# Patient Record
Sex: Female | Born: 1935 | Race: Black or African American | Hispanic: No | Smoking: Former smoker
Health system: Southern US, Community
[De-identification: ages and names within clinical notes are randomized; demographics above are authoritative.]

## PROBLEM LIST (undated history)

## (undated) DIAGNOSIS — I2510 Atherosclerotic heart disease of native coronary artery without angina pectoris: Secondary | ICD-10-CM

## (undated) DIAGNOSIS — R935 Abnormal findings on diagnostic imaging of other abdominal regions, including retroperitoneum: Secondary | ICD-10-CM

## (undated) DIAGNOSIS — N12 Tubulo-interstitial nephritis, not specified as acute or chronic: Secondary | ICD-10-CM

## (undated) DIAGNOSIS — I639 Cerebral infarction, unspecified: Secondary | ICD-10-CM

## (undated) DIAGNOSIS — S065XAA Traumatic subdural hemorrhage with loss of consciousness status unknown, initial encounter: Secondary | ICD-10-CM

## (undated) DIAGNOSIS — I519 Heart disease, unspecified: Secondary | ICD-10-CM

## (undated) DIAGNOSIS — S065X9A Traumatic subdural hemorrhage with loss of consciousness of unspecified duration, initial encounter: Secondary | ICD-10-CM

## (undated) DIAGNOSIS — K5790 Diverticulosis of intestine, part unspecified, without perforation or abscess without bleeding: Secondary | ICD-10-CM

## (undated) DIAGNOSIS — M1990 Unspecified osteoarthritis, unspecified site: Secondary | ICD-10-CM

## (undated) DIAGNOSIS — I10 Essential (primary) hypertension: Secondary | ICD-10-CM

## (undated) HISTORY — DX: Traumatic subdural hemorrhage with loss of consciousness status unknown, initial encounter: S06.5XAA

## (undated) HISTORY — DX: Traumatic subdural hemorrhage with loss of consciousness of unspecified duration, initial encounter: S06.5X9A

## (undated) HISTORY — DX: Abnormal findings on diagnostic imaging of other abdominal regions, including retroperitoneum: R93.5

## (undated) HISTORY — PX: TONSILLECTOMY AND ADENOIDECTOMY: SUR1326

## (undated) HISTORY — DX: Tubulo-interstitial nephritis, not specified as acute or chronic: N12

---

## 1983-07-12 HISTORY — PX: TOTAL ABDOMINAL HYSTERECTOMY: SHX209

## 2000-02-03 ENCOUNTER — Ambulatory Visit (HOSPITAL_COMMUNITY): Admission: RE | Admit: 2000-02-03 | Discharge: 2000-02-03 | Payer: Self-pay | Admitting: Orthopedic Surgery

## 2000-02-03 ENCOUNTER — Encounter: Payer: Self-pay | Admitting: Orthopedic Surgery

## 2002-07-11 HISTORY — PX: CRANIOTOMY: SHX93

## 2003-02-19 ENCOUNTER — Encounter: Payer: Self-pay | Admitting: Neurosurgery

## 2003-02-19 ENCOUNTER — Inpatient Hospital Stay (HOSPITAL_COMMUNITY): Admission: EM | Admit: 2003-02-19 | Discharge: 2003-02-27 | Payer: Self-pay | Admitting: Emergency Medicine

## 2003-02-19 ENCOUNTER — Encounter (INDEPENDENT_AMBULATORY_CARE_PROVIDER_SITE_OTHER): Payer: Self-pay | Admitting: *Deleted

## 2003-02-21 ENCOUNTER — Encounter: Payer: Self-pay | Admitting: Neurosurgery

## 2003-02-24 ENCOUNTER — Encounter: Payer: Self-pay | Admitting: Neurosurgery

## 2003-02-26 ENCOUNTER — Encounter: Payer: Self-pay | Admitting: Neurosurgery

## 2003-03-21 ENCOUNTER — Encounter: Payer: Self-pay | Admitting: Neurosurgery

## 2003-03-21 ENCOUNTER — Ambulatory Visit (HOSPITAL_COMMUNITY): Admission: RE | Admit: 2003-03-21 | Discharge: 2003-03-21 | Payer: Self-pay | Admitting: Neurosurgery

## 2003-04-14 ENCOUNTER — Ambulatory Visit (HOSPITAL_COMMUNITY): Admission: RE | Admit: 2003-04-14 | Discharge: 2003-04-14 | Payer: Self-pay | Admitting: Neurosurgery

## 2003-04-14 ENCOUNTER — Encounter: Payer: Self-pay | Admitting: Neurosurgery

## 2003-09-13 ENCOUNTER — Ambulatory Visit (HOSPITAL_COMMUNITY): Admission: RE | Admit: 2003-09-13 | Discharge: 2003-09-13 | Payer: Self-pay | Admitting: Emergency Medicine

## 2004-07-11 DIAGNOSIS — N12 Tubulo-interstitial nephritis, not specified as acute or chronic: Secondary | ICD-10-CM

## 2004-07-11 HISTORY — DX: Tubulo-interstitial nephritis, not specified as acute or chronic: N12

## 2004-11-23 ENCOUNTER — Emergency Department (HOSPITAL_COMMUNITY): Admission: EM | Admit: 2004-11-23 | Discharge: 2004-11-24 | Payer: Self-pay | Admitting: Emergency Medicine

## 2004-11-24 ENCOUNTER — Ambulatory Visit: Payer: Self-pay | Admitting: Sports Medicine

## 2004-11-24 ENCOUNTER — Inpatient Hospital Stay (HOSPITAL_COMMUNITY): Admission: AD | Admit: 2004-11-24 | Discharge: 2004-11-30 | Payer: Self-pay | Admitting: Sports Medicine

## 2004-11-24 HISTORY — PX: FLEXIBLE SIGMOIDOSCOPY: SHX1649

## 2004-11-30 ENCOUNTER — Encounter: Admission: RE | Admit: 2004-11-30 | Discharge: 2004-11-30 | Payer: Self-pay | Admitting: Gastroenterology

## 2005-02-22 ENCOUNTER — Ambulatory Visit (HOSPITAL_COMMUNITY): Admission: RE | Admit: 2005-02-22 | Discharge: 2005-02-22 | Payer: Self-pay | Admitting: Emergency Medicine

## 2006-10-13 ENCOUNTER — Encounter: Admission: RE | Admit: 2006-10-13 | Discharge: 2006-10-13 | Payer: Self-pay | Admitting: Emergency Medicine

## 2008-03-27 ENCOUNTER — Encounter: Admission: RE | Admit: 2008-03-27 | Discharge: 2008-03-27 | Payer: Self-pay | Admitting: Gastroenterology

## 2008-10-01 ENCOUNTER — Encounter: Admission: RE | Admit: 2008-10-01 | Discharge: 2008-10-01 | Payer: Self-pay | Admitting: Emergency Medicine

## 2010-04-01 ENCOUNTER — Encounter: Admission: RE | Admit: 2010-04-01 | Discharge: 2010-04-01 | Payer: Self-pay | Admitting: Emergency Medicine

## 2010-08-01 ENCOUNTER — Encounter: Payer: Self-pay | Admitting: Emergency Medicine

## 2010-11-26 NOTE — Discharge Summary (Signed)
NAMESHAKEMIA, [name]              ACCOUNT NO.:  [id number]   MEDICAL RECORD NO.:  [id number]          PATIENT TYPE:  INP   LOCATION:  5507                         FACILITY:  [facility]   PHYSICIAN:  Melina Fiddler, MD DATE OF BIRTH:  [birth year]   DATE OF ADMISSION:  11/24/2004  DATE OF DISCHARGE:                                 DISCHARGE SUMMARY   PROBLEM LIST:  1.  Fever, pyelonephritis. Blood cultures were negative and urine cultures      were negative were unable to be utilized secondary to contaminated urine      sample. The patient did respond to Cipro and remained afebrile and      stable with no symptoms of urinary tract infection throughout the rest      of hospitalization. WBC trended down to within normal limits.   Dictation ended at this point.      FIM/MEDQ  D:  11/29/2004  T:  11/30/2004  Job:  [id number]

## 2010-11-26 NOTE — Op Note (Signed)
NAME:  [name], [name]              ACCOUNT NO.:  [id number]   MEDICAL RECORD NO.:  [id number]          PATIENT TYPE:  INP   LOCATION:  5507                         FACILITY:  [facility]   PHYSICIAN:  Anselmo Rod, M.D.  DATE OF BIRTH:  1934-01-17   DATE OF PROCEDURE:  11/24/2004  DATE OF DISCHARGE:  11/30/2004                                 OPERATIVE REPORT   PROCEDURE PERFORMED:  Colonoscopy changed to a flexible sigmoidoscopy.   ENDOSCOPIST:  Anselmo Rod, M.D.   INSTRUMENT USED:  Olympus video colonoscope.   INDICATIONS FOR PROCEDURE:  A 77 year old, African-American female with a  history of change in bowel habits and abnormal CT with thickening of the  sigmoid colon noted, undergoing a colonoscopy to rule out colitis.   PRE-PROCEDURE PREPARATION:  Informed consent was procured from the patient.  The patient was fasted for eight hours prior to the procedure and prepped  with a bottle of magnesium citrate and a gallon of GoLYTELY the night prior  to the procedure.  Risks and benefits of the procedure, including a 10% miss  rate of cancer and polyps, were discussed with the patient as well.   PRE-PROCEDURE PHYSICAL:  VITAL SIGNS:  The patient had stable vital signs.  NECK:  Supple.  CHEST:  Clear to auscultation.  CARDIOVASCULAR:  S1, S2 regular.  ABDOMEN:  Soft, with normal bowel sounds.   DESCRIPTION OF THE PROCEDURE:  The patient was placed in the left lateral  decubitus position, sedated with 50 mg of Demerol and 6 mg of Versed in slow  incremental doses.  Once the patient was adequately sedated and maintained  on low-flow oxygen and continuous cardiac monitoring, the Olympus video  colonoscope was advanced from the rectum to the midsigmoid colon with  extreme difficulty.  The adult scope could not be advanced beyond the  midsigmoid colon.  Therefore, it was withdrawn.  Pediatric scope introduced  instead.  However, in spite of all efforts and changing the patient's  position from the left lateral to the right lateral position and the supine  position, we were not able to advance the scope beyond the midsigmoid colon.  The procedure was aborted at that point, with plans to do a virtual  colonoscopy tomorrow.   IMPRESSION:  Incomplete colonoscopy.  Very tortuous colon, with probably  extensive diverticular disease.   RECOMMENDATIONS:  A virtual colonoscopy will be done, as discussed with Dr.  Kennith Center in the department of radiology, and further recommendations  will be made as needed.      JNM/MEDQ  D:  11/30/2004  T:  12/01/2004  Job:  [id number]   cc:   Melina Fiddler, MD  [address] [place]  [place] [address]  Fax: [phone]

## 2010-11-26 NOTE — H&P (Signed)
NAME:  [name], [name] NO.:  [id number]   MEDICAL RECORD NO.:  [id number]                   PATIENT TYPE:  INP   LOCATION:  1827                                 FACILITY:  [facility]   PHYSICIAN:  Hewitt Shorts, M.D.            DATE OF BIRTH:  03-02-1934   DATE OF ADMISSION:  02/19/2003  DATE OF DISCHARGE:                                HISTORY & PHYSICAL   HISTORY OF PRESENT ILLNESS:  The patient is a 77 year old right-handed black  female who was transferred from [facility] today by EMS to [facility] Emergency Room for further evaluation and care of an  acute and chronic right frontotemporal subdural hematoma.   The patient has been followed at [facility] for hypertension,  currently treated with hydrochlorothiazide and atenolol.  She presented  today having cut herself superolaterally to her right eye after she had  difficulty controlling her gait.  She explained to Dr. Salvadore Farber at [facility] that once she got started, she had difficulty stopping.   The patient explained to me that she has been stumbling for the last two  days.  When she starts walking, her gait picks up, and she cannot slow it  down.  She feels her equilibrium is off, and she cannot control her walking.   She explains that she fell earlier today but did not hit her head.  Later  today, she ran into a fence that resulted in the cut near her eye.  That cut  was sutured at [facility] by Dr. Salvadore Farber.   Because of these difficulties, she went ahead and obtained a CT scan at  [facility] which shows a moderate size right frontotemporal  acute and chronic subdural hematoma with moderate mass effect and midline  shift.   Dr. Salvadore Farber arranged for transfer to [facility]  Emergency Room by EMS and called for Neurosurgical consultation.   The patient notes that she has been having headaches  since mid July.  She  has no particular precipitating cause for the headaches.  She does feel that  as far as trauma is concerned, she did bump her head on a cabinet  subsequently to the development of her headaches.   PAST MEDICAL HISTORY:  Significant for a history of hypertension, treated  for more than 30 years, currently treated at [facility] with  hydrochlorothiazide and atenolol.  She does not describe any history of  myocardial infarction, cancer, stroke, diabetes, peptic ulcer disease, or  lung disease.   PAST SURGICAL HISTORY:  1. Three cesarean sections.  2. Hysterectomy.  3. Tonsillectomy.   ALLERGIES:  She denies allergies to medications.   MEDICATIONS:  In addition to the hydrochlorothiazide and atenolol, her only  other medication is occasional Mylanta p.r.n. for gas.   FAMILY HISTORY:  Her parents have both passed on.  Her  mother died of a  stroke.  She believes her father had heart disease.   SOCIAL HISTORY:  The patient is separated from her husband, but they do help  to look after each other.  He has had some recent heart disease.  She has  one son who lives with her in [place] and another daughter who lives in  [place].  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 She smokes 1/2 pack a day.  She  has been smoking for nearly 50 years.  She does not drink alcoholic  beverages.   REVIEW OF SYSTEMS:  She does not describe any diplopia, blurred vision,  weakness, language or speech dysfunction, seizures, or nausea or vomiting.  Review of Systems is, otherwise, unremarkable.   PHYSICAL EXAMINATION:  GENERAL:  The patient is a well-developed, well-  nourished black female in no acute distress.  VITAL SIGNS:  Temperature 97.6, pulse 61, blood pressure 171. 102,  respiratory rate 18.  LUNGS:  Clear to auscultation.  She has symmetric respiratory excursion.  HEART:  Regular rate and rhythm;  S1, S2.  There is no murmur.  ABDOMEN:  Soft, nondistended.  Bowel sounds are present.  EXTREMITIES:  No clubbing, cyanosis, or edema.  HEENT:  Examination shows a sutured laceration superolateral to the right  eye.  NEUROLOGICAL:  The patient was awake, alert, fully oriented.  Speech is  fluent, and she has good comprehension.  Cranial nerves show pupils to be  equal, round, and reactive to light and about 2 mm bilaterally.  Extraocular  movements are intact.  Facial movement is symmetrical.  Hearing is present.  Palate movement is symmetrical.  Shoulder shrug is symmetrical, and tongue  is in the midline.  Motor examination shows 5/5 strength in the upper and  lower extremities.  She has no drift to the upper extremities.  Sensation is  intact to pinprick throughout the extremities.  Reflexes are symmetrical.  Toes are downgoing.  Gait and stance were not tested.   IMPRESSION:  Moderate size right frontotemporal acute and chronic subdural  hematoma with mass effect and midline shift, associated with gait  dysfunction and imbalance as well as with headache.  Neurologically, though,  she is intact.   PLAN:  The patient is to be admitted to the Neurosurgical Intensive Care  Unit. I do recommend craniotomy for evacuation of the subdural hematoma, and  we will plan on loading the patient with Dilantin for seizure prophylaxis.  I have had the opportunity to discuss my assessment and recommendations with  the patient.  I have recommended, though, that we try to have her family  come into to the hospital so that we can discuss all together her  difficulties and our recommendations. She is going to contact her husband  (from whom she is separated), her son, and her daughter.                                                Hewitt Shorts, M.D.   RWN/MEDQ  D:  02/19/2003  T:  02/19/2003  Job:  [id number]   cc:   Delano Metz, M.D. [address]  [place]  [place] [address]  Fax:  [phone]

## 2010-11-26 NOTE — H&P (Signed)
NAME:  [name], [name]              ACCOUNT NO.:  [id number]   MEDICAL RECORD NO.:  [id number]          PATIENT TYPE:  INP   LOCATION:  5501                         FACILITY:  [facility]   PHYSICIAN:  Melina Fiddler, MD DATE OF BIRTH:  1934-04-08   DATE OF ADMISSION:  11/24/2004  DATE OF DISCHARGE:                                HISTORY & PHYSICAL   CHIEF COMPLAINT:  Fever.   HISTORY OF PRESENT ILLNESS:  The patient is a 77 year old African American  female.  She was at work on Nov 23, 2004 and her co-workers noticed her to  have bad color.  The patient said that she didn't feel quite well.  While at work, her legs gave way.  She had no symptoms of  presyncope/syncope/dizziness.  She went home after work and was noticed by  her daughter to have an increased temperature of greater than 100.4 degrees  and continued weakness along with decreased level of activity.  She was  taken to the ER and a CT was negative of her head.  She did have a UA that  was positive for pyuria.  She was diagnosed with UTI and prescribed  Macrobid, and discharged home.  At home, on Nov 24, 2004, her fever  continued and she went to urgent care, and was found to have a temperature  of 102 with continued weakness, so she was sent to [facility] for treatment.   PAST MEDICAL HISTORY:  Past medical history is notable for:  1.  Hypertension.  2.  History of subdural hematoma with craniotomy.  The patient is on      Dilantin for seizure prophylaxis; she has never had a seizure.  3.  History of UTI.  4.  History of hypokalemia for which she takes K-Dur.   SOCIAL HISTORY:  She lives with a son.  She is married.  She works at  __________.  No alcohol, no tobacco, no drugs.  She is independent with  ADLs.   FAMILY HISTORY:  Family history positive for hypertension and diabetes  mellitus.   MEDICATIONS:  1.  Dilantin 100 mg p.o. daily.  2.  Hydrochlorothiazide 12.5 mg p.o. daily.  3.  Atenolol  12.5 mg p.o. daily.  4.  K-Dur.  5.  Macrobid.   ALLERGIES:  FLAGYL causing a rash.   CODE STATUS:  Full code.   REVIEW OF SYSTEMS:  Review of systems positive for fevers, negative for  rash.  No nausea and vomiting.  The patient is taking good p.o.  No  diarrhea.  No back pain.  Positive right abdominal pain.  No dysuria,  although possible history of incontinence and urgency/frequency.  No chest  pain, no shortness of breath.   PHYSICAL EXAM:  VITAL SIGNS:  Temperature 101.8, pulse 85, respiratory rate  18, blood pressure 105/70.  GENERAL:  The patient is in no apparent distress.  Per the daughter, the  speech has slowed somewhat from normal, but she is alert and oriented and  responsive.  HEENT:  Normocephalic, atraumatic.  Pupils are equal, round and reactive to  light.  Mucous membranes are moist.  No oropharyngeal erythema.  The lips  appear slightly dry.  NECK:  No lymphadenopathy, no thyromegaly.  Supple.  CARDIOVASCULAR:  Regular rate and rhythm with no murmurs, rubs, or gallops  appreciated.  RESPIRATORY:  Clear to auscultation bilaterally with fair respiratory  effort.  ABDOMEN:  Abdomen soft, slightly tender at the right upper and lower  quadrants, diffuse, but there is no rebound, no peritoneal signs, positive  bowel sounds.  BACK:  No CVA pain.  EXTREMITIES:  No edema.  Pulses 2+.  SKIN:  No rash.   LABORATORY VALUES:  Laboratory values, all from Nov 22, 2004:  Urine  cultures and blood cultures are pending.  UA at presentation last p.m. into  the emergency room, cloudy with 15 ketones, positive for blood and protein,  positive leukocyte esterase; microscopic examination -- 7-10 white cells, 3-  6 red blood cells, a few bacteria.  Dilantin level 17.7.  CBC:  White count  14.9, hemoglobin 13.3, hematocrit 39.2, platelets 171,000.  CMP:  Sodium  134, potassium 2.9, chloride 107, bicarb 28, BUN 11, creatinine 1, calcium  8.6, glucose 125, total bilirubin 0.9,  alkaline phosphatase 110, AST 23, ALT  18, total protein 6.6, albumin 3.3.   Head CT noted with no acute disease.   Chest x-ray:  No acute disease.   ASSESSMENT AND PLAN:  The patient is a 77 year old female with:  1.  Fever/presume pyelonephritis.  Fever continues on Macrobid.  I will      admit her for intravenous fluids and observation, and begin intravenous      Cipro.  Cultures are pending from Nov 23, 2004 and will not re-culture      because she has been on antibiotics in the meantime and we are awaiting      sensitivities.  There is no other source of the infection identified at      this time.  The abdominal pain is diffuse, but LFTs are okay and there      is no rebound tenderness with positive bowel sounds and nausea or      vomiting.  I did not have any concerns on physical exam for appendicitis      or other causes of peritoneal inflammation.  She has no respiratory      symptoms.  Given the pyuria, this seems to be the most likely source,      though certainly an atypical presentation.  I will give Tylenol as      needed for fever.  2.  Fluids, electrolytes and nutrition:  Regular diet and replete her      potassium.  She will get a BMET in the morning.  We will bolus 500 mL of      normal saline now and then give her 150 an hour of half normal saline      with 20 mEq of potassium.  3.  Hypertension:  Continue home medications.  4.  Seizure prophylaxis:  Continue Dilantin and Dilantin was within normal      limits.  5.  Deep venous thrombosis prophylaxis:  Given the reasons for #4, we will      hold the Lovenox and use sequential compression devices.  6.  Disposition pending #1.      GSD/MEDQ  D:  11/25/2004  T:  11/25/2004  Job:  [id number]   cc:   Brett Canales A. Cleta Alberts, M.D.  [address]  [place]  [place] [address]  Fax:  [phone] 

## 2010-11-26 NOTE — Consult Note (Signed)
NAME:  SHINA, WASS              ACCOUNT NO.:  [id number]   MEDICAL RECORD NO.:  [id number]          PATIENT TYPE:  INP   LOCATION:  5507                         FACILITY:  [facility]   PHYSICIAN:  Anselmo Rod, M.D.  DATE OF BIRTH:  05/24/1934   DATE OF CONSULTATION:  11/26/2004  DATE OF DISCHARGE:                                   CONSULTATION   GI CONSULTATION:   REASON FOR CONSULTATION:  Abnormal CT scan, ? colitis.   PROBLEM LIST:  1. Thickened sigmoid colon, CT done during this hospitalization,      ?diverticulitis versus infectious colitis versus neoplasm.  2. Pyelonephritis, on Cipro.  3. Hypertension, on Atenolol and hydrochlorothiazide.  4. History of an craniotomy after subdural hematoma.  5. History of seizure disorder, on Dilantin.  6. History of hypokalemia, on K-Dur     RECOMMENDATIONS:  1. Agree with stool studies.  2. Will prep the patient for a colonoscopy on Monday and keep her on      clears all day Sunday.  Further recommendations after.  3. Continue Cipro for now.     DISCUSSION:  [name] is a 77 year old African-American female who  came to [facility] on Nov 24, 2004 when she presented with weakness  and prior to admission she had fevers of 101 and 102 and in the process of  her workup had a CT scan of the abdomen that showed thickening of the  sigmoid colon with an enlarged lymph node in the gastrohepatic ligament and  possibly one in the neck of gallbladder with some stone in the gallbladder.  The patient denies any problems with abnormal weight loss, nausea and  vomiting, hematochezia, melena.  She has had some left lower quadrant pain  but this has been mild in nature with no specific radiation.  Her appetite  is good.  Her weight has been stable.  She has had some loose stools  when  she has been in the hospital but the symptoms started after the CT,  ?contrast induced diarrhea.  She denies problems with anemia.  There is no  previous history of ulcers, thrombus or colitis.   ALLERGIES:  FLAGYL.   PAST MEDICAL HISTORY:  See list above.   SOCIAL HISTORY:  She lives with her son.  She is married.  She works at  [employer].  She denies use of alcohol, tobacco or drugs.  She is independent.   FAMILY HISTORY:  There is no family history of breast, uterine or colon  cancer.  There are several family members with hypertension and diabetes.   PHYSICAL EXAMINATION:  General physical examination reveals an elderly  African-American female in no acute distress, lying comfortably in bed.  HEENT:  Reveals signs of her previous craniotomy.  NECK:  Supple.  CHEST:  Clear to auscultation.  HEART:  S1, S2 regular.  ABDOMEN:  Soft, with normal bowel sounds.   LABORATORY DATA:  White count of 8.2 yesterday, with group home of 12.5,  hematocrit 37.3, MCV 99.1, platelets of 168,000.  Sodium yesterday was 130  with potassium 4.7, chloride 103, CO2  20, glucose 120, BUN 5, creatinine  0.9, total bilirubin 1.3, alkaline phosphatase 100.  AST 20, ALT 15, total  protein 6.2, albumin 2.6, calcium 8.8.  phenytoin level was 14.4.  Urinalysis showed 7-10 white blood cells per high-powered field, with 3-6  red blood cells per high-powered field.  Stool was negative for occult  blood.   CT scan of the abdomen and pelvis with contrast showed hyper density in the  durum of the liver, abnormally enlarged lymph nodes in the gastrohepatic  ligament, and strain pattern on the right kidney with multiple areas of  hypodensity but no contour abnormalities suggesting pyelonephritis.  There  is a question of stone in the neck of the gallbladder versus calcified lymph  node.  CT of the pelvis with contrast showed right inguinal hernia  containing  small bowel without obstruction.  There was thickening of the sigmoid colon  with possibility including diverticulitis versus neoplasm.   PLAN:  Further recommendations will be made as we continue to  follow the  patient.      JNM/MEDQ  D:  11/26/2004  T:  11/27/2004  Job:  [id number]

## 2010-11-26 NOTE — Discharge Summary (Signed)
NAMEDENELL, COTHERN              ACCOUNT NO.:  [id number]   MEDICAL RECORD NO.:  [id number]          PATIENT TYPE:  INP   LOCATION:  5507                         FACILITY:  [facility]   PHYSICIAN:  Melina Fiddler, MD DATE OF BIRTH:  [birth year]   DATE OF ADMISSION:  11/24/2004  DATE OF DISCHARGE:  11/30/2004                                 DISCHARGE SUMMARY   DIAGNOSES:  1.  Thirst, fever, pyelonephritis.  2.  Abdominal pain.  3.  Hypertension.  4.  Fissure.  5.  Hypokalemia.   DISCHARGE MEDICATIONS:  1.  Dilantin 300 mg p.o. daily.  2.  HCTZ 12.5 mg 1 tablet p.o. daily.  3.  K-Dur 10 mEq 1 tablet p.o. daily.  4.  Cipro 500 mg 1 tablet p.o. daily for eight days.   BRIEF HISTORY:  A 77 year old African-American female who was at work on Nov 23, 2004 as a coworker noticed that she has a bad color.  The patient stated  that she did not feel quite well. While at work, her legs gave away. She had  no symptoms of presyncope/dizziness. The patient went home from work and was  noticing by her brother to have an increased temperature rated at 100.4 and  continued weakness along with decrease level of activities.   The patient was taken to the ER and a CT of head was negative. A urinalysis  was positive for pyuria. She was diagnosed with UTI and was prescribed  Macrobid IV and discharged home. At home on Nov 24, 2004, fever continued  and he went to urgent care and was found to have temperature of 102.0 with  continued weakness. She was sent to [facility] for  treatment.   PHYSICAL EXAMINATION:  VITAL SIGNS:  Temperature was 101.8, pulse 85,  respiratory rate 18, blood pressure 105/70.  GENERAL:  The patient was in no acute distress. Per daughter, the speech was  not normal but she is alert and oriented x 3 and __________.  ABDOMEN:  Physical examination was unremarkable except for on an abdominal  examination that showed some ascites tenderness at the right upper  and lower  quadrant, diffuse. There were no rebound, guarding. Bowel sounds were  positive. No CVA pain.   LABORATORY DATA:  On admission, urine and blood culture were pending from  Nov 22, 2004. As UA at presentation in to the emergency room showed colonies  with 15 ketones, positive for blood and protein, positive for LE.  Microscopic examination showed 7 to 10 white blood cells, 3 to 6 red blood  cells, few bacteria. Dilantin level was 17.7. White blood cell count was  10.9, hemoglobin 17.3, hematocrit 39.2, platelet count 171,000. Sodium 134,  potassium 2.9, chloride 107, bicarbonate 28, BUN 11, creatinine 1, calcium  8.6, glucose 125, total bilirubin 0.9, alkaline phosphate 110, AST 23, ALT  18, protein 6.6, albumin 3.3.   Chest CT showed no acute disease. Chest x-ray showed no acute disease.   HOSPITAL COURSE:  Problem 1:  FEVER, PYELONEPHRITIS:  As CT scan showed a  strain pattern on the  right kidney with multiple areas of hypodensity but no  contour abnormalities though she has pyelonephritis. The patient was placed  on Cipro 400 mg initially IV p.o. daily.   By the day of discharge, the patient has been afebrile for four days. White  blood cell count trended down to normal limits. The patient's benign CVA,  dysuria, frequency. The patient was discharged on Cipro 500 mg 1 tablet p.o.  daily for eight days to complete a 14-day course for treatment of  pyelonephritis. This issue to be followed by primary care physician, Dr. Delano Metz.   Problem 2:  ABDOMINAL PAIN:  The patient initially complained of right upper  and right lower quadrant pain. This pain resolved by itself. By the second  day of admission, the patient complained of left lower quadrant pain. The  patient described the pain as a compression, dull pain. Intensity was 2 to 4  out of 10. The pain was on and off. Palpation on the left lower quadrant has  to be done for the patient to be able to feel this pain.  The abdominal and  pelvic CT scan with contrast showed hypodensity in the durum of the liver,  abnormally enlarged lymph nodes at the hepatic gastrohepatic ligament and  right inguinal hernia containing a small bowel without obstruction. There  was also a thickening of the sigmoid colon. GI was consulted. Per Dr. Anselmo Rod, the thickened of the sigmoid could possibly diverticulitis versus  neoplasm. A colonoscopy was performed on Nov 29, 2004. The procedure was  unable to be rescheduled secondary to __________.  I spoke with Dr. Anselmo Rod, GI, who explained to me that diverticulosis was found on sigmoid  colon of this patient. As colonoscopy was unable to be completed, a virtual  colonoscopy was scheduled on Nov 30, 2004. This virtual colonoscopy was  performed at [facility] by Dr. Lorenda Ishihara. Mansell. He is to be followed  by Dr. Anselmo Rod as an outpatient.   Problem 3:  HYPERTENSION:  The patient's blood pressure was stable  throughout the hospitalization. The patient was discharged on HCTZ 12.5 mg 1  tablet p.o. daily. Followed by primary care physician, Dr. Delano Metz.   Problem 4:  SEIZURE PROPHYLAXIS:  The patient was placed on home regimen,  Dilantin 300 mg p.o. daily. The patient was free of any seizure episodes  while hospitalized.   Problem 5:  HYPOKALEMIA:  Repleted. To be followed primary care physician.   CONDITION ON DISCHARGE:  Stable.   PROCEDURE:  CT of the abdomen, CT of the pelvis, colonoscopy (complete).   FOLLOW UP:  1.  Eric A. Molli Posey, M.D. at [facility] at 3:15 with Ma Hillock on Nov 09, 2004 for a cysto and colonoscopy.  2.  The patient will follow up with Dr. Anselmo Rod, GI, to arrange an      appointment for follow-up of diverticulosis, [phone].  3.  Dr. Delano Metz, PMD, __________.      FIM/MEDQ  D:  11/29/2004  T:  11/29/2004  Job:  [id number]   cc:   Minerva Areola A. Molli Posey, M.D. [phone] [address]   [place]  [place] [address]  Fax: [phone]   Anselmo Rod, M.D.  [address]  [address]  [place]  [place] [address]  Fax: [phone]   Delano Metz, M.D.  [address]  [place]  [place] [address]  Fax:  [phone] 

## 2010-11-26 NOTE — Op Note (Signed)
NAME:  [name], [name]                          ACCOUNT NO.:  [id number]   MEDICAL RECORD NO.:  [id number]                   PATIENT TYPE:  INP   LOCATION:  3111                                 FACILITY:  [facility]   PHYSICIAN:  Hewitt Shorts, M.D.            DATE OF BIRTH:  [birth year]   DATE OF PROCEDURE:  02/19/2003  DATE OF DISCHARGE:                                 OPERATIVE REPORT   PREOPERATIVE DIAGNOSIS:  Right frontotemporal acute and chronic subdural  hematoma.   POSTOPERATIVE DIAGNOSIS:  Right frontotemporal chronic subdural hematoma.   PROCEDURE:  Right frontotemporal craniotomy and evacuation of subdural  hematoma.   SURGEON:  Hewitt Shorts, M.D.   ANESTHESIA:  General endotracheal.   INDICATIONS:  The patient is a 77 year old woman who presented with  headaches, unsteadiness, and gait difficulties, who was found to have a  moderately large right frontotemporal subdural hematoma.  A decision was  made to proceed with craniotomy and evacuation of such.   DESCRIPTION OF PROCEDURE:  The patient was brought to the operating room,  placed under general endotracheal anesthesia.  The patient's scalp was  shaved.  The head was gently turned to the left and rested in a soft  doughnut head rest and was prepped with Betadine soap and solution and  draped in a sterile fashion.  A right frontotemporal curvilinear incision  was made.  The line of the incision was infiltrated with local anesthetic  with epinephrine.  Rainey clips were applied to the scalp edges to maintain  hemostasis.  The temporalis fascia and muscle were divided and the scalp  flap reflected and the right frontotemporal region exposed.  Three bur holes  were made using the [facility] Max drill.  The dura was dissected from the  overlying skull and using the craniotome, we were able to elevate a bone  flap.  The dura was full and had a bluish discoloration.  We opened the dura  and exposed chronic subdural  membrane.  The membrane was, in fact, opened,  and one found several layers of membranes with chronic subdural hematoma of  various ages, the great majority of which was quite liquid, other portions  of it were somewhat thicker; however, no acute subdural hematoma was found,  rather just chronic subdural hematoma of varying ages.  The  membranes were  tacked up to the dura and the subdural space irrigated extensively with  saline solution and the subdural hematoma evacuated.  The brain was  pulsating well.  The dura was tacked up around the margins of the craniotomy  and then a flat Jackson-Pratt drain was brought out through a separate stab  incision.  The drain itself was laid in the subdural space.  The dura was  approximated with interrupted 4-0 Nurolon sutures and then the bone flap was  secured with Rapid Flap.  The temporalis fascia and muscle were approximated  with interrupted  2-0 undyed Vicryl suture and the subcutaneous and  subcuticular layer were closed with interrupted, inverted 2-0 undyed Vicryl  sutures, and the skin was reapproximated with surgical staples.  The wound  was dressed with Adaptic and sterile gauze and wrapped with two Kerlix and a  Kling.  The patient tolerated the procedure well.  The estimated blood loss  was 100 mL.  Sponge and needle count were correct.  Following surgery the  patient was to be reversed from the anesthetic, extubated if feasible, and  transferred to the neurosurgical intensive care unit for further care.                                               Hewitt Shorts, M.D.   RWN/MEDQ  D:  02/19/2003  T:  02/20/2003  Job:  [id number]

## 2010-11-26 NOTE — Op Note (Signed)
[facility]  Patient:    [name], [name]                       MRN: [id number] Proc. Date: 02/03/00 Adm. Date:  [id number] Disc. Date: [id number] Attending:  Dominica Severin                           Operative Report  DATE OF BIRTH:  Mar 16, 1934  PREOPERATIVE DIAGNOSIS:  Left ring finger proximal interphalangeal joint interarticular fracture with subluxation and gross displacement.  POSTOPERATIVE DIAGNOSIS:  Left ring finger proximal interphalangeal interarticular fracture with subluxation and gross displacement.  OPERATION PERFORMED:  Open reduction internal fixation left ring finger interarticular PIP joint fracture with displacement.  SURGEON:  Onalee Hua, M.D.  ASSISTANT:  Maud Deed, P.A.  COMPLICATIONS:  None.  ANESTHESIA:  General.  ESTIMATED BLOOD LOSS:  Minimal.  DRAINS:  None.  TOURNIQUET TIME:  Less than an hour.  INDICATIONS FOR PROCEDURE:  This patient is a 77 year old white female with the above mentioned diagnosis. I have counseled her in regards to the risks and benefits of the surgery including risks of infection, bleeding, anesthesia, damage to normal structure and failure of the surgery to accomplish its intended goals of relieving symptoms and restoring function. With this in mind, she desires to proceed. All indications, pre and postoperative measures as well as risk of infection, stiffness, need for hardware removal, tenolysis, etc. have been discussed with the patient at length.  INTRAOPERATIVE FINDINGS:  The patient had a displaced subluxed interarticular fracture about the PIP joint. She was effectively reduced and stabilized with open reduction internal fixation and a smooth arc of motion at the conclusion of the case.  DESCRIPTION OF PROCEDURE:  The patient was seen by myself and anesthesia preoperatively. She was then taken to the operative suite and underwent general anesthesia smoothly. She was  appropriately padded, prepped and draped in the usual sterile fashion with a 10 minute surgical Betadine scrub followed by sterile Betadine painting and draping about the left upper extremity. Once this was done and preoperative antibiotics were in, the patient had manipulation of anesthesia of the fracture fragments. Traction was placed. The patient had a volar radial fragment which was somewhat displaced maneuvered into correct position and pinned to the shaft with 0.35 and 0.28 K wires. Following this, the dorsal fragment was identified and it was unable to be manipulated closed into perfect alignment thus a tourniquet was inflated and a dorsal incision was made. The dorsal incision was made, a skin flap elevated and the patient then underwent exposure of the joint. The joint showed a rather displaced dorsal lip fracture which caused volar subluxation of the joint. A large amount of hematoma and debris was clear from the fracture site. Irrigation was applied meticulous and tedious dissection was carried out under 4.0 loop magnification and the patients dorsal lip fracture was then placed into the interarticular middle phalanx region at the PIP joint and effectively reduced and held manually with K wire. Following this, the patient had Leibinger titanium screws x 2 placed from dorsal to volar with standard AO technique of drilling followed by depth gauge measurement and screw placement. This achieved excellent purchase and effectively stabilized the joint. The patient was stable through a smooth arc or motion to 85 degrees without tendency towards dislocation of subluxation or fracture displacement. With this done, the patient then had percutaneous placement of titanium screws  from ulnar to radial to engage the shaft and volar radial piece. This effectively stabilized the middle phalanx. Additional K wires were then removed and the patient placed in an arc of motion under fluoroscopy which  noted excellent stability and smooth arc of motion. Permanent copies were made for documentation purposes. Following this, the tourniquet was deflated, irrigation was applied to the wound and hemostasis was obtained with bipolar electrocautery. The patient then underwent closure of the wound with interrupted Prolene suture. I was satisfied with the arc of motion, rotator alignment and other intraoperative perimeters. The patient tolerated the procedure without difficulty. Marcaine approximately 9 cc was placed for postop comfort, 0.25% without epinephrine. The patient had Adaptic gauze and a splint placed with volar and dorsal slabs and finger splint. The patient had excellent capillary refill. She was taken to the recovery room in stable condition following extubation in the operative suite. All sponge, needle and instrument counts were reported as correct. There were no immediate operative complications.  She was given Keflex, Phenergan, Robaxin and Percocet to be taken as directed. The patient will notify us should any problems occur otherwise. I will see her in a week in the office.  It has been a pleasure to participate in her care and I look forward to participating in postoperative recovery. DD:  02/03/00 TD:  02/07/00 Job: [id number]

## 2010-11-26 NOTE — Discharge Summary (Signed)
NAME:  [name], [name] NO.:  [id number]   MEDICAL RECORD NO.:  [id number]                   PATIENT TYPE:  INP   LOCATION:  3016                                 FACILITY:  [facility]   PHYSICIAN:  Hewitt Shorts, M.D.            DATE OF BIRTH:  06/15/1934   DATE OF ADMISSION:  02/19/2003  DATE OF DISCHARGE:  02/27/2003                                 DISCHARGE SUMMARY   ADMISSION HISTORY AND PHYSICAL EXAMINATION:  The patient is a 77 year old  woman who was transferred from [facility] by EMS to [facility] after having been found to have a right frontotemporal  subdural hematoma.  Details of the admission history are included in her  admission history and physical examination.   PHYSICAL EXAMINATION:  VITAL SIGNS:  She had normal vital signs.  GENERAL:  Unremarkable.  NEUROLOGIC:  The patient was awake, alert, and oriented.  She had good  strength with no drift.   HOSPITAL COURSE:  The patient was evaluated in the emergency room where she  was found to have a moderate-sized right frontotemporal subdural hematoma  and the decision was made to admit the patient and proceed with surgical  evacuation.  The patient underwent a right frontotemporal craniotomy and  evacuation of subdural hematoma.  A flat Jackson-Pratt was left in place and  there was moderate watery drainage, pink-tinged.  Followup CT scans were  done.  It was felt that there was significant decreased mass effect and an  overall improved appearance.  We were eventually able to remove the Al Pimple drain and place a stitch in the exit site.  The wound, itself, has  healed well, and we have gone ahead and removed the staples and suture.  Followup CT scan shows minimal recurrent/residual subdural hematoma with  overall good evacuation of the subdural hematoma.  She is up and ambulating  actively in the halls, and she is being discharged to home.  Her daughter  is  going to take her home with her, and she, her fiancee, and her son are going  to help look after her.  We have recommended she be walking everyday,  increasing distances.  We do want her to avoid excessive straining and  lifting.  She has given prescriptions for Dilantin 100 mg capsules, brand  name only, to take three capsules q.a.m.  She is to resume her usual blood  pressure medications.  She is to follow up with Dr. Salvadore Farber, at [facility], regarding her blood pressure, and she is to follow up in my  office in three weeks with a CT scan without contrast from [facility] earlier that day.    DISCHARGE DIAGNOSES:  1. Chronic subdural hematoma.  2. Hypokalemia (treated).  3. Hypertension (treated).  Hewitt Shorts, M.D.    RWN/MEDQ  D:  02/27/2003  T:  02/28/2003  Job:  [id number]   cc:   Delano Metz, M.D.  [address]  [place]  [place] [address]  Fax: [phone]

## 2010-11-29 ENCOUNTER — Other Ambulatory Visit: Payer: Self-pay | Admitting: Emergency Medicine

## 2010-11-29 DIAGNOSIS — R51 Headache: Secondary | ICD-10-CM

## 2010-11-30 ENCOUNTER — Ambulatory Visit
Admission: RE | Admit: 2010-11-30 | Discharge: 2010-11-30 | Disposition: A | Discharge status: Home / Self Care | Payer: PRIVATE HEALTH INSURANCE | Source: Ambulatory Visit | Attending: Emergency Medicine | Admitting: Emergency Medicine

## 2010-11-30 DIAGNOSIS — R51 Headache: Secondary | ICD-10-CM

## 2011-04-12 ENCOUNTER — Encounter (HOSPITAL_COMMUNITY): Payer: Self-pay | Admitting: Radiology

## 2011-04-12 ENCOUNTER — Emergency Department (HOSPITAL_COMMUNITY): Payer: Medicare Other

## 2011-04-12 ENCOUNTER — Inpatient Hospital Stay (HOSPITAL_COMMUNITY)
Admission: EM | Admit: 2011-04-12 | Discharge: 2011-04-14 | DRG: 066 | Disposition: A | Discharge status: Home / Self Care | Payer: Medicare Other | Attending: Family Medicine | Admitting: Family Medicine

## 2011-04-12 DIAGNOSIS — Z7902 Long term (current) use of antithrombotics/antiplatelets: Secondary | ICD-10-CM

## 2011-04-12 DIAGNOSIS — I10 Essential (primary) hypertension: Secondary | ICD-10-CM | POA: Diagnosis present

## 2011-04-12 DIAGNOSIS — E785 Hyperlipidemia, unspecified: Secondary | ICD-10-CM | POA: Diagnosis present

## 2011-04-12 DIAGNOSIS — R29810 Facial weakness: Secondary | ICD-10-CM | POA: Diagnosis present

## 2011-04-12 DIAGNOSIS — E876 Hypokalemia: Secondary | ICD-10-CM | POA: Diagnosis present

## 2011-04-12 DIAGNOSIS — R279 Unspecified lack of coordination: Secondary | ICD-10-CM | POA: Diagnosis present

## 2011-04-12 DIAGNOSIS — Z79899 Other long term (current) drug therapy: Secondary | ICD-10-CM

## 2011-04-12 DIAGNOSIS — I6350 Cerebral infarction due to unspecified occlusion or stenosis of unspecified cerebral artery: Principal | ICD-10-CM | POA: Diagnosis present

## 2011-04-12 DIAGNOSIS — R471 Dysarthria and anarthria: Secondary | ICD-10-CM | POA: Diagnosis present

## 2011-04-12 DIAGNOSIS — H9319 Tinnitus, unspecified ear: Secondary | ICD-10-CM | POA: Diagnosis present

## 2011-04-12 DIAGNOSIS — M1990 Unspecified osteoarthritis, unspecified site: Secondary | ICD-10-CM | POA: Diagnosis present

## 2011-04-12 DIAGNOSIS — I359 Nonrheumatic aortic valve disorder, unspecified: Secondary | ICD-10-CM | POA: Diagnosis present

## 2011-04-12 DIAGNOSIS — K5730 Diverticulosis of large intestine without perforation or abscess without bleeding: Secondary | ICD-10-CM | POA: Diagnosis present

## 2011-04-12 HISTORY — DX: Cerebral infarction, unspecified: I63.9

## 2011-04-12 HISTORY — DX: Diverticulosis of intestine, part unspecified, without perforation or abscess without bleeding: K57.90

## 2011-04-12 HISTORY — DX: Essential (primary) hypertension: I10

## 2011-04-12 LAB — DIFFERENTIAL
Monocytes Absolute: 0.8 10*3/uL (ref 0.1–1.0)
Neutro Abs: 4.2 10*3/uL (ref 1.7–7.7)

## 2011-04-12 LAB — URINALYSIS, ROUTINE W REFLEX MICROSCOPIC
Bilirubin Urine: NEGATIVE
Hgb urine dipstick: NEGATIVE
Leukocytes, UA: NEGATIVE
Protein, ur: NEGATIVE mg/dL

## 2011-04-12 LAB — COMPREHENSIVE METABOLIC PANEL
ALT: 11 U/L (ref 0–35)
AST: 15 U/L (ref 0–37)
Albumin: 3.7 g/dL (ref 3.5–5.2)
Calcium: 10.2 mg/dL (ref 8.4–10.5)
Chloride: 103 mEq/L (ref 96–112)
Creatinine, Ser: 0.73 mg/dL (ref 0.50–1.10)
GFR calc non Af Amer: 82 mL/min — ABNORMAL LOW (ref >90)
Glucose, Bld: 82 mg/dL (ref 70–99)
Sodium: 140 mEq/L (ref 135–145)

## 2011-04-12 LAB — RAPID URINE DRUG SCREEN, HOSP PERFORMED
Barbiturates: NOT DETECTED
Benzodiazepines: NOT DETECTED
Cocaine: NOT DETECTED
Opiates: NOT DETECTED

## 2011-04-12 LAB — PROTIME-INR
INR: 1.05 (ref 0.00–1.49)
Prothrombin Time: 13.9 seconds (ref 11.6–15.2)

## 2011-04-12 LAB — POCT I-STAT TROPONIN I: Troponin i, poc: 0.01 ng/mL (ref 0.00–0.08)

## 2011-04-12 LAB — CBC
MCHC: 34.6 g/dL (ref 30.0–36.0)
RBC: 4.54 MIL/uL (ref 3.87–5.11)

## 2011-04-13 ENCOUNTER — Inpatient Hospital Stay (HOSPITAL_COMMUNITY): Payer: Medicare Other

## 2011-04-13 DIAGNOSIS — I10 Essential (primary) hypertension: Secondary | ICD-10-CM

## 2011-04-13 DIAGNOSIS — G459 Transient cerebral ischemic attack, unspecified: Secondary | ICD-10-CM

## 2011-04-13 DIAGNOSIS — I6350 Cerebral infarction due to unspecified occlusion or stenosis of unspecified cerebral artery: Secondary | ICD-10-CM

## 2011-04-13 LAB — LIPID PANEL
Cholesterol: 218 mg/dL — ABNORMAL HIGH (ref 0–200)
Triglycerides: 67 mg/dL (ref <150)

## 2011-04-13 LAB — CARDIAC PANEL(CRET KIN+CKTOT+MB+TROPI)
CK, MB: 2.1 ng/mL (ref 0.3–4.0)
Relative Index: 1.8 (ref 0.0–2.5)
Relative Index: 1.8 (ref 0.0–2.5)
Total CK: 131 U/L (ref 7–177)
Total CK: 117 U/L (ref 7–177)
Troponin I: <0.3 ng/mL (ref <0.30)
Troponin I: <0.3 ng/mL (ref <0.30)

## 2011-04-13 LAB — GLUCOSE, CAPILLARY
Glucose-Capillary: 117 mg/dL — ABNORMAL HIGH (ref 70–99)
Glucose-Capillary: 147 mg/dL — ABNORMAL HIGH (ref 70–99)

## 2011-04-13 LAB — BASIC METABOLIC PANEL
Calcium: 9.7 mg/dL (ref 8.4–10.5)
Creatinine, Ser: 0.68 mg/dL (ref 0.50–1.10)
GFR calc Af Amer: >90 mL/min (ref >90)
Sodium: 142 mEq/L (ref 135–145)

## 2011-04-13 LAB — HEMOGLOBIN A1C: Mean Plasma Glucose: 120 mg/dL — ABNORMAL HIGH (ref <117)

## 2011-04-13 NOTE — H&P (Signed)
Please see handwritten addendum. I have seen and examined this pt with R1 and I agree with the above assessment and plan.  

## 2011-04-13 NOTE — H&P (Signed)
Family Medicine Teaching [facility] Admission History and Physical  Patient name: [name] Medical record number: [id number] Date of birth: 1934-03-23 Age: 77 y.o. Gender: female  Primary Care Provider: No primary provider on file.  Chief Complaint: Left Sided Weakness  History of Present Illness:  [name] is a 77 y.o. year old female presenting with left sided weakness and facial droop for the past 24 hours. She first noticed some arm ataxia and weakness that seemed to improve slightly on its own before going to bed; she also reported some dysarthria that also seemed to improve prior to going to bed.  When she awoke this morning she noticed that her face was drooping on the left while she was putting on makeup.  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  Dr. Cleta Alberts was concerned for a stroke and sent her to [facility] ED for further evaluation.  Of note she has recently had dilantin stopped as a seizure prophylaxis following a subdural hematoma evacuation in 2004.  She has never had a seizure and had been found to be sub therapeutic on her dilantin.    ROS:    Denies chest pain, dyspnea, cough.  No nausea/vomiting.  Some constipation; no melana or hematachezia.  No dysuria, no hematuria.  No other weakness except as above.  No changes in vision, hearing, smell or taste but does have chronic tinnitus.  Some swelling in LE especially Left ankle 2o/2 osteoarthritis.  Some congestion and mucous on chronic basis.  Otherwise 12 point ROS negative   PMHx:  Past Medical History  Diagnosis Date  . Hypertension   . Subdural Hematoma   . Osteoarthritis   . 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  Alert and appropriately interactive.In no distress. HNEENT:  H&N: supple, no adenopathy, trachea midline and no carotid bruits OROPHARYNX: lips, mucosa, and tongue normal; teeth and gums normal EYES: EOMI, PERRLA, no scleral icterus NOSE: normal THORAX: HEART: S1, S2 normal, no murmur, rub or gallop, regular rate and rhythm LUNGS: clear to auscultation, no wheezes or rales and unlabored breathing EXTREMITIES: edema 1+/4 B and Homans sign is negative, no sign of DVT  >NEURO mental status, speech normal, alert and oriented x3, PERLA, reflexes normal and symmetric, gait and station normal; L facial droop; strength 5/5 B in UE and LE however some lateralization of weakness to the left side diffusely;   Labs CBC    Component Value Date/Time   WBC 7.2 04/12/2011 2146   HGB  14.2 04/12/2011 2146   HCT 41.0 04/12/2011 2146   PLT 163 04/12/2011 2146   BMET    Component Value Date/Time   NA 140 04/12/2011 2146   K 3.4* 04/12/2011 2146   CL 103 04/12/2011 2146   CO2 28 04/12/2011 2146   GLUCOSE 82 04/12/2011 2146   BUN 11 04/12/2011 2146   CREATININE 0.73 04/12/2011 2146   CALCIUM 10.2 04/12/2011 2146   GFRNONAA 82* 04/12/2011 2146   GFRAA >90 04/12/2011 2146   Drug Screen:  Utox Negative  EtOH: negative  Coagulation Studies:  PT/INR: 13.9 / 1.05  PTT: 46  POC CE:  Trop I:  0.01  IMAGING: CT Head:  No acute intracranial findings   Postoperative changes from right craniotomy.  No  underlying extra-axial fluid collection.  No hemorrhage,  hydrocephalus, mass lesion or acute infarction. Visualized  paranasal sinuses and mastoids clear.  Orbital soft tissues  unremarkable.  CXR:  No evidence of active pulmonary disease  Assessment & Plan: [name] is a 77 y.o. year old female presenting with right sided stroke.  Her past medical history is significant for: hypertension prior traumatic subdural hematoma requiring craniotomy and is being admitted to the hospital (telemetry bed) by the [facility] Medicine Teaching Service.   1. Stroke - Right Sided - No CT evidence of a bleed or of ischemic process.  Will order MRI in AM; Placed on the Stroke Order set. [ ]  MRI Head w/o contrast [ ]  Transcranial Dopplers [ ]  2D ECHO [ ]  Carotid Dopplers -  Risk Stratification  [ ]  Lipid Panel  [ ]  HbA1C * ASA 325mg  po q day Consulting:  Neurology  PT/OT/ST  2. HTN - continue home med regimen but do not drop blood pressure any further.  3. Hypokalemia - continue to monitor; will re pleat if persistently below 3.5    -- FEN/GI:   IVFs NS @ 162ml/hr   Diet: soft regular diet -- Prophylaxis:   *Heparin 5000Units sq tid  ======> Will further characterize her stroke and perform appropriate risk stratification.  Will continue to monitor her for any acute changes.  We do not feel that her stopping her dilantin recently had anything to do with her current symptoms and feel this is merely coincidental at this time.

## 2011-04-14 ENCOUNTER — Encounter: Payer: Self-pay | Admitting: Sports Medicine

## 2011-04-14 NOTE — Progress Notes (Addendum)
Family Medicine Teaching Service Daily Resident Note  Patient name: [name] Medical record number: [id number] Date of birth: 08/27/1933 Age: 77 y.o. Gender: female  Subjective:  Mr. Rodwell is feeling well this morning.  Not having any trouble with ambulation.  Does report some persistent dysarthria but no dysphagia.  Time spent on pt education of new medications and explanation of MRI findings and prognosis.  Pt does inquire about persistent long standing tinnitus; currently unchanged.  States it sounds like steam rushing from her ears on occation.  No hearing loss, no balance issues, no vertigo symptoms.    Objective: Vitals: Temp: 98.9oF BP: 147/39mmHg HR 59bpm RR: 20resp   O2: 96% on RA   PE: GENERAL:  Elderly African-American female, examined in 3000.  Alert and appropriately interactive.  In no distress. HNEENT: PERRLA, extra ocular movement intact and sclera clear, anicteric THORAX: HEART: S1, S2 normal, no murmur, rub or gallop, regular rate and rhythm LUNGS: clear to auscultation, no wheezes or rales and unlabored breathing EXTREMITIES: extremities normal, atraumatic, no cyanosis or edema NEURO normal without focal findings, mental status, speech normal, alert and oriented x3, PERLA and muscle tone and strength normal although slight lateralzation of weakness of L side diffusely.  Persistent left sided facial droop  Labs Cholesterol  Total: 218  LDL: 140  HbA1C - 5.8  Cardiac Enzymes:   CK/MB Ratio - 1.8  Trop I <0.03 X 2  IMAGING: MRI Brain:  Small focus of acute infarction within the posterior limb internal   capsule on the right. No hemorrhage or mass effect.   Mild chronic small vessel change affecting the hemispheric white   matter.   Distant right craniotomy.  2D ECHO:   - Left ventricle: The cavity size was normal. Wall thickness      was normal. Systolic function was normal. The estimated      ejection fraction was in the range of 55% to 60%.    -  Aortic valve: Moderately calcified annulus. Mildly      thickened leaflets.  Carotid Dopplers: prelim read is negative for any significant carotid or vertebral artery occlusion  >>>>  Transcranial Ultrasound: completed; read pending  >>>>  Assessment & Plan: [name] is a 77 y.o. female who was admitted for a right sided stroke.   1. Stroke - Right Sided - with difficulty walking, difficulty with fine motor tasks and residual dysarthria. MRI shows small infarct in posterior limb of the internal capsule on the right.  Neurology agrees with plan of starting statin and changing ASA to Plavix.  Will continue to need BP monitoring but will only resume home BP meds at time of D/c. Cardiovascular workup is unrevealing at this time for either carotid/vertebral or heart source.    - PT/OT/ST suggest out patient rehab - pt will go to [place] with Daughter to stay until able to return to work.    > 10/3   *Plavix 75mg  po q day  2. HTN  - D/c atenolol - will start at d/c   *Lisinopril 5mg  po q day   *HCTZ 12.5 mg po q day  3. Hyperlipidemia - goal will be LDL < 70  *Crestor 40mg  po qhs while in hospital    [ ]  change to Lipitor 80mg  @ time of d/c   4. Tinnitus - long standing - No revealing findings on MRI.  Continue workup as out patient.    5. Hypokalemia - continue to monitor; will re pleat if persistently  below 3.5    -- FEN/GI:   IVFs NS @ 133ml/hr   Diet: soft regular diet -- Prophylaxis:   *Heparin 5000Units sq tid  ======> Pt able to ambulate and perform ADLs adequately to be able to go home.  Will need out patient PT/OT/ST to work on left sided weakness, fine motor skills and dysarthria.  Plan on D/c to home with daughter today.

## 2011-04-19 NOTE — Discharge Summary (Signed)
[name], [name]              ACCOUNT NO.:  [id number]  MEDICAL RECORD NO.:  [id number]  LOCATION:  3019                         FACILITY:  [facility]  PHYSICIAN:  [name], M.D.DATE OF BIRTH:  [birth year]  DATE OF ADMISSION:  04/12/2011 DATE OF DISCHARGE:  04/14/2011                              DISCHARGE SUMMARY   REASON FOR ADMISSION:  Stroke.  DISCHARGE DIAGNOSES: 1. Right Brain stroke. 2. Hypertension. 3. Hyperlipidemia. 4. Tinnitus. 5. Hypokalemia. 6. Difficulty walking. 7. Difficulty with fine motor tasks. 8. Dysarthria.  BRIEF HOSPITAL COURSE:  [name] is a 77 year old female who was admitted for a right-sided stroke with generalized left-sided weakness that was grossly less than the right side, however, was still 5/5 strength diffusely, she also had a left facial droop and some dysarthria at the time of presentation.  She was worked up further under the stroke protocol and was found to have a acute infarction within the posterior limb of the internal capsule on the right.  2-D echo revealed a normal left ventricular ejection fraction of 55-60% with some moderately calcified aortic valve and mildly thickened leaflets.  Carotid Dopplers were preliminarily read as negative for any significant carotid or vertebral artery occlusion, however, final reads are pending.  She was evaluated by Physical Therapy, Occupational Therapy and Speech Therapy and was able to ambulate with some assistance and able to perform her ADLs with some assistance, but was not completely independent at this time.  Speech Therapy also felt that she was appropriate for outpatient therapy for her dysarthria and did not have any concerns for dysphasia. She was risk stratified while she was here with a cholesterol panel being drawn that revealed a total cholesterol 218 and LDL of 140. Hemoglobin A1c was obtained that showed hemoglobin A1c of 5.8.  Due to this risk stratification, she was  started on Lipitor 80 at the time of discharge and her atenolol that she was on her blood pressure maintenance was discontinued and she was transitioned to lisinopril 5 mg p.o. daily as well as hydrochlorothiazide 12.5 mg p.o. daily.  She does report that she has had some intolerance to hydrochlorothiazide prior due to excessive urination and was discussed with her and she would be willing to try this dose to see if she is able to tolerated at this time.  CONDITION ON DISCHARGE:  Improved.  MEDICATIONS AT THE TIME OF DISCHARGE:  She is to stop taking her Dilantin as previously stopped by her neurosurgeon.  She is to stop taking her atenolol.  She is to stop taking her aspirin.  New medications include 1. Plavix 75 mg p.o. daily. 2. Hydrochlorothiazide 12.5 mg p.o. daily. 3. Lipitor 80 mg p.o. daily. 4. Lisinopril 5 mg p.o. daily.  Medications to continue: 1. calcium carbonate/vitamin D OTC 1 tablet p.o. daily 2. nabumetone 500 mg 1 tablet p.o. daily 3. Lovaza 2 capsules twice daily, vitamin D2 one tablet p.o. daily.  HOSPITAL FOLLOWUP PENDING RESULTS:  Carotid Dopplers and transcranial ultrasound.  FOLLOWUP APPOINTMENTS:  She is to schedule followup appointment with [name] her primary care physician at [facility] in 2 weeks.  FOLLOWUP ISSUES:  Includes monitor her high blood  pressure and including obtaining a basic metabolic panel since we are initiating lisinopril on her.  DISCHARGE DISPOSITION:  She is to be discharged home with her daughter who lives in [place] to undergo outpatient rehab.  She does plan to return to the [place] area in the next couple of weeks and will follow up with [name] upon returning and does plan to return to work as soon as possible.    ______________________________ Jill Bidding, DO   ______________________________ [name], M.D.    MR/MEDQ  D:  04/14/2011  T:  04/14/2011  Job:  [id number]  cc:   Dr.  Cleta Chapman  Electronically Signed by [name]  on 04/15/2011 06:45:44 AM Electronically Signed by [name] M.D. on 04/19/2011 03:50:48 PM

## 2011-04-19 NOTE — H&P (Signed)
NAME:  [name], [name] NO.:  [id number]  MEDICAL RECORD NO.:  [id number]  LOCATION:  [name]                         FACILITY:  [name]  PHYSICIAN:  [name], M.D.DATE OF BIRTH:  06-Nov-1933  DATE OF ADMISSION:  04/12/2011 DATE OF DISCHARGE:                             HISTORY & PHYSICAL   ATTENDING PHYSICIAN:  Jill Roach Jill Guertin, MD, with [name].  PRIMARY Chapman PROVIDER:  Stan Chapman. Jill Alberts, MD, with [name].  CHIEF COMPLAINT:  Left-sided weakness.  CODE STATUS:  Full code.  HISTORY OF PRESENT ILLNESS:  [name] is a 77 year old female presenting with left-sided weakness and facial droop over the past 24 hours.  She first noticed some arm ataxia and weakness that seemed to improve slightly on its own before going to bed along with some dysarthria that also improved prior to going to bed.  When she woke this morning she noticed that her face was drooping on the left while she was putting on makeup.  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  Following work she did go to see [name], her primary Chapman physician at [name] and he is concerned for stroke and sent her to [name] ED for further evaluation.  Of note, she recently had Dilantin stopped as a seizure prophylaxis following subdural hematoma evacuation in 2004, last dose was this past Saturday, September 29.  She never had a seizure and was found to be subtherapeutic on her Dilantin and was felt safe to discharge at that time by her neurosurgeon.  REVIEW OF SYSTEMS:  She denies chest pain, dyspnea, cough.  No nausea or vomiting except for some constipation with no melena or hematochezia. No dysuria.  No hematuria.  No other weakness except as above.  No changes in vision, hearing, smell or taste but does have chronic tenderness.  Some swelling in the left lower extremity secondary  to osteoarthritis.  She does report some congestion in mucus which is chronic for her.  Otherwise 12-point review of systems is negative.  PAST MEDICAL HISTORY: 1. Hypertension. 2. Subdural hematoma. 3. Osteoarthritis. 4. Diverticulosis.  PAST SURGICAL HISTORY: 1. Hysterectomy. 2. Subdural hematoma craniotomy. 3. Tonsillectomy.  SOCIAL HISTORY:  Currently is separated from her husband.  She lives at home.  Her son lives with her.  She does not smoke, does not drink alcohol.  Uses no illicit drugs.  She works at [name] as a Human resources officer.  FAMILY HISTORY:  Pertinent for hypertension and diabetes.  ALLERGIES:  She is allergic to FLAGYL and LATEX.  HOME MEDICATIONS: 1. Aspirin 81 mg p.o. daily. 2. Atenolol 50 mg p.o. daily. 3. Calcium carbonate vitamin D OTC p.o. daily. 4. Dilantin 100 mg p.o. daily.  Her last dose was April 09, 2011. 5. Nabumetone 500 mg p.o. daily for no more than 3 days out of 10. 6. Omega-3 fatty acids 2 g p.o. b.i.d. 7. Vitamin D over-the-counter p.o. daily.  OBJECTIVE:  VITAL SIGNS:  Heart rate of 60 beats per minute, blood pressure of 142/75, respirations of 16, O2 100% on room air.  PHYSICAL EXAM:  GENERAL:  This is an elderly African American female examined in [name] ED.  She is alert and appropriately interactive in no distress. HEENT/NECK:  Supple.  No adenopathy.  Trachea is midline.  No carotid bruits.  Oropharynx, lips, mucosa, tongue normal.  Teeth and gums are normal.  Eyes, extraocular muscles are intact.  Pupils are equally round and reactive to light and accommodation.  No scleral icterus.  Nose, normal. HEART:  S1, S2 is normal.  No murmur, rub, gallop.  Regular rate and rhythm. LUNGS:  Clear to auscultation.  No wheezes or rales and there is unlabored breathing. EXTREMITIES:  Edema 1+/4 bilaterally.  Negative Homans sign bilaterally. NEURO:  Mental status, speech normal.  Alert and oriented x3.  Pupils are equally  round and reactive to light and accommodation.  Reflexes are normal and symmetric.  Gait and station are normal.  Left facial droop is present.  Strength is 5/5 bilaterally in both upper and lower extremities, however there is some lateralization of weakness of the left side diffusely.  LABORATORY DATA:  CBC shows a white count of 7.2, hemoglobin of 14.2, hematocrit of 41.0, and platelets of 163.  Basic metabolic panel revealed a sodium of 140, potassium of 3.4, chloride of 103, CO2 of 28, glucose of 82, BUN of 11, creatinine of 0.73, calcium of 10.2.  Drug screen including U-tox, and alcohol levels were negative.  Coagulation studies, PT/INR.  PT 13.9, INR 1.05, PTT of 46 slightly elevated.  Point- of-Chapman of cardiac enzymes, troponin I 0.01.  IMAGING:  CT of the Chapman showed no acute intracranial findings.  Chest x- ray showed no evidence of active pulmonary disease.  ASSESSMENT AND PLAN:  [name] is a 77 year old female presenting with right-sided stroke.  Her past medical history is significant for hypertension and prior traumatic subdural hematoma requiring craniotomy. She is being admitted to this hospital in a telemetry bed by the family medicine teaching service.  PROBLEM: 1. Stroke, right-sided.  There is no CT evidence of a bleed or an     ischemic process.  We will order MRI in the a.m.  She has been     placed on stroke order set.  We had an MRI of the Chapman without     contrast, transcranial Dopplers, 2-D echo, carotid Dopplers all     pending.  We will also risk stratify her and obtain lipid panel,     hemoglobin A1c.  We started her on aspirin 325 mg p.o. daily and we     will have Neurology, Physical Therapy, Occupational Therapy and     Speech Therapy consulted. 2. Hypertension.  We will continue her home med regimen but we do not     want to try for blood pressure any further and we will consider     stopping this medication if she does begin having persistently  low     blood pressures. 3. Hypokalemia, current potassium is 34.  We will continue to monitor     we will replete it if it is persistently below 2.5. 4. Fluids, electrolytes, nutrition, gastrointestinal.  IV fluids,     normal saline at 100 mL per hour.  Diet can be a soft regular diet     until fully evaluated by Speech Therapy. 5. Prophylaxis.  She is going to get heparin 5000 units subcu t.i.d.  DISPOSITION:  We will further characterize her stroke and perform appropriate risk stratification.  We will continue to monitor her for any  changes and we do not feel that stopping her Dilantin recently had anything to do with her current symptoms which is why this is merely coincidental at this time.    ______________________________ Gaspar Bidding, DO   ______________________________ Jill Roach Delphin Funes, M.D.    MR/MEDQ  D:  04/13/2011  T:  04/13/2011  Job:  [id number]  Electronically Signed by Gaspar Bidding  on 04/17/2011 11:25:07 AM Electronically Signed by Acquanetta Belling M.D. on 04/19/2011 03:50:53 PM

## 2011-04-20 NOTE — Consult Note (Signed)
NAMEMarland Kitchen  [name], [name] NO.:  [id number]  MEDICAL RECORD NO.:  [id number]  LOCATION:  3019                         FACILITY:  [facility]  PHYSICIAN:  Jill Farr, MD    DATE OF BIRTH:  Aug 04, 1933  DATE OF CONSULTATION:  04/13/2011 DATE OF DISCHARGE:                                CONSULTATION   TIME:  1438.  REASON FOR CONSULTATION:  Right internal capsule stroke.  PRESENT ILLNESS:  This is a 77 year old female with past medical history of hypertension, a right subdural hematoma with craniotomy in 2004, pyelonephritis, osteoarthritis, diverticulosis.  The patient was at a normal baseline approximately 3 days ago on April 10, 2011, when that evening she noted that she had some ataxia and weakness with her left arm along with clumsiness.  In addition, she noted some dysarthria. The patient went to sleep at night and did not think anything of her symptoms.  When she awoke the next day on April 11, 2011, she noted continued weakness and clumsiness of her left arm and now a left facial droop.  The patient went to work and participated in a full days' work at her shift.  People at her work were concerned, but she believed that her left facial droop was due to her dentures not fitting.  The following day, patient did go to her primary care MD who was worried some of possible stroke symptoms and called an ambulance, had her sent to [facility].  Upon admission, the patient's CT of her head was negative.  However, today her MRI came back showing a small focus of acute infarction within the posterior limb of the internal capsule on the right.  Of note, the patient did have a craniotomy secondary to subdural hematoma in 2004, at that time she was placed on 300 mg of Dilantin daily by Dr. Newell Chapman.  The patient recently had an appointment with Dr. Newell Chapman, in which she was titrated off of her Dilantin.  The patient did not have a seizure at any point in time.   The Dilantin was purely for prophylaxis.  PAST MEDICAL HISTORY:  As noted above.  MEDICATIONS AT HOME:  She was on aspirin, atenolol, recently taken off Dilantin and nabumetone.  While in the hospital, she has been switched from aspirin to Plavix.  She is currently on atenolol, calcium carbonate, vitamin D, subcu heparin for DVT prophylaxis, and Tylenol.  ALLERGIES:  FLAGYL and LATEX.  FAMILY HISTORY:  Noncontributory.  SOCIAL HISTORY:  The patient does not smoke, drink, or do illicit drugs, separated from her husband.  Her son does live with her.  REVIEW OF SYSTEMS:  Positive for left arm weakness, left facial droop, intermittent right-sided headaches, clumsiness of left arm, some difficulty with speech.  Otherwise negative.  PHYSICAL EXAMINATION:  VITAL SIGNS:  Blood pressure is 119/82, pulse 72, respirations 18, temperature 98.1. NEUROLOGIC:  The patient is alert and oriented x3, carries out 2 to 3 step commands without difficulty.  Pupils are equal, round, and reactive to light and accommodating.  Conjugate gaze.  Extraocular movements are intact.  Visual fields grossly intact.  The patient states does have a left facial droop, nose noticeable  at left nasolabial and corner of her mouth.  Tongue is midline.  Uvula is midline.  She does have some slight dysarthria, but very minimal.  Facial sensation stated to be intact bilaterally.  Shoulder shrug and head turn is within normal limits. COORDINATION:  Finger-to-nose was smooth, heel-to-shin was smooth.  She does have clumsiness of her left hand with fine motor movements.  The patient's motor showed a weak left shoulder abduction and triceps extension, slightly weaker grip on the left positive drift on the left upper extremity otherwise 5/5.  Her deep tendon reflexes were 2+ throughout.  Downgoing toes bilaterally.  Sensation was stated to be grossly intact bilaterally pinprick and light touch. PULMONARY:  Clear to  auscultation. CARDIOVASCULAR:  S1, S2 audible.  She did show bradycardia sinus rhythm on her EKG.  LABS:  Sodium 142, potassium 3.4, chloride 106, CO2 of 30, BUN 10, creatinine 0.68, glucose 111.  White blood cell count 7.2, platelets 163, hemoglobin 14.2, hematocrit 41.0, triglycerides 67, cholesterol 118, HDL 65, LDL 140.  Imaging CT of head was negative.  MRI of brain shows a small focus of acute infarction within the posterior right internal capsule.  A 2-D echo is pending.  Carotid Dopplers at preliminary shows no ICA stenosis.  ASSESSMENT:  This is a 77 year old female with past medical history of hypertension, right subdural hematoma with evacuation in 2004.  The patient now presents with 3-day history of left upper extremity weakness, left arm clumsiness, left facial droop.  The patient initially was on aspirin, but has recently been changed to Plavix.  MRI brain shows an acute right posterior limb internal capsular stroke.  RECOMMENDATIONS: 1. Continue Plavix daily. 2. Physical therapy and occupational therapy. 3. Initiate statin therapy. 4. Blood pressure control. 5. HgbA1c to be checked.  Dr. Thad Chapman has seen evaluated the patient and agreed the above- mentioned.     Jill Morn, PA-C   ______________________________ Jill Farr, MD    DS/MEDQ  D:  04/13/2011  T:  04/13/2011  Job:  [id number]  Electronically Signed by Jill Morn PA-C on 04/15/2011 04:54:09 AM Electronically Signed by Jill Farr MD on 04/20/2011 02:28:30 PM

## 2011-08-18 ENCOUNTER — Encounter: Payer: Self-pay | Admitting: *Deleted

## 2011-08-18 DIAGNOSIS — K5790 Diverticulosis of intestine, part unspecified, without perforation or abscess without bleeding: Secondary | ICD-10-CM | POA: Insufficient documentation

## 2011-08-18 DIAGNOSIS — M810 Age-related osteoporosis without current pathological fracture: Secondary | ICD-10-CM | POA: Insufficient documentation

## 2011-08-18 DIAGNOSIS — I10 Essential (primary) hypertension: Secondary | ICD-10-CM

## 2011-08-18 DIAGNOSIS — R569 Unspecified convulsions: Secondary | ICD-10-CM

## 2011-09-06 ENCOUNTER — Ambulatory Visit: Payer: Self-pay | Admitting: Emergency Medicine

## 2011-09-28 ENCOUNTER — Ambulatory Visit (INDEPENDENT_AMBULATORY_CARE_PROVIDER_SITE_OTHER): Payer: Medicare Other | Admitting: Emergency Medicine

## 2011-09-28 ENCOUNTER — Ambulatory Visit: Payer: Medicare Other

## 2011-09-28 VITALS — BP 142/95 | HR 67 | Temp 98.1°F | Resp 16 | Ht 58.5 in | Wt 115.6 lb

## 2011-09-28 DIAGNOSIS — S6000XA Contusion of unspecified finger without damage to nail, initial encounter: Secondary | ICD-10-CM

## 2011-09-28 DIAGNOSIS — S62609A Fracture of unspecified phalanx of unspecified finger, initial encounter for closed fracture: Secondary | ICD-10-CM

## 2011-09-28 NOTE — Progress Notes (Signed)
  Subjective:    Patient ID: [name], female    DOB: [birth year], 78 y.o.   MRN: [id number]  HPI patient was in her usual state of health until Friday when she accidentally struck her left hand on the side of the door. Since that time she has had pain and swelling in her left fifth finger especially at the PIP joint    Review of Systems review of systems is as related to the present illness.    Objective:   Physical Exam exam is limited to the left fifth finger. There significant fusiform swelling involving the proximal and middle phalanx. There is some tenderness down to the M. CP joint. She is able to flex and extend the fifth finger.   [facility] reading (PRIMARY) by  Larin Depaoli comminuted fracture of the proximal phalanx of the fifth finger     Assessment & Plan:   Finger was buddy taped to the fourth finger which she has had surgery on before. We'll make an emergent referral to Dr. Amanda Pea for evaluation

## 2011-09-30 ENCOUNTER — Ambulatory Visit: Payer: Medicare Other

## 2011-09-30 ENCOUNTER — Ambulatory Visit (INDEPENDENT_AMBULATORY_CARE_PROVIDER_SITE_OTHER): Payer: Medicare Other | Admitting: Emergency Medicine

## 2011-09-30 VITALS — BP 148/85 | HR 70 | Temp 97.9°F | Resp 16 | Ht 58.52 in | Wt 114.0 lb

## 2011-09-30 DIAGNOSIS — R634 Abnormal weight loss: Secondary | ICD-10-CM

## 2011-09-30 DIAGNOSIS — R05 Cough: Secondary | ICD-10-CM

## 2011-09-30 DIAGNOSIS — R059 Cough, unspecified: Secondary | ICD-10-CM

## 2011-09-30 NOTE — Progress Notes (Signed)
  Subjective:    Patient ID: [name], female    DOB: August 10, 1933, 78 y.o.   MRN: [id number]  HPI she came in primarily to have a redressing of her fracture of the fifth finger. She's been trying to get an appointment with Dr. Cheree Ditto made but has been unable to because she has an outstanding balance. They are agreeable to work with her on this and are supposed to call her back with an appointment time. She is a comminuted fracture of the proximal phalanx of the fifth finger that may need pinning.    Review of Systems patient complained of pooling in her posterior pharynx of fluid. She denies any true dysphasia. She used to be a smoker but quit years ago. She is concerned about her weight loss.     Objective:   Physical Exam the fifth finger was buddy taped to the fourth finger pinning her orthopedic evaluation    [facility] reading (PRIMARY) by  Dr.Niketa Turner chest x-ray reveals no acute disease. Over the lower thorasic spine  there is a radiodense area that I suspect is an osteophyte off of one of the thoracic vertebrae.      Assessment & Plan:  Patient's weight loss which needs to be evaluated further. She is convinced she has allergic type symptoms with collection of fluid phlegm in her mouth. She denies any true dysphagia. She denies having any trouble swallowing meat or chunks of food she is scheduled to see me for a physical on Tuesday. We'll address her weight loss at that time and go ahead today and make an ambulatory referral for allergy testing.

## 2011-10-04 ENCOUNTER — Ambulatory Visit (INDEPENDENT_AMBULATORY_CARE_PROVIDER_SITE_OTHER): Payer: Medicare Other | Admitting: Emergency Medicine

## 2011-10-04 VITALS — BP 139/86 | HR 67 | Temp 97.2°F | Resp 16 | Ht 58.5 in | Wt 112.0 lb

## 2011-10-04 DIAGNOSIS — S62609A Fracture of unspecified phalanx of unspecified finger, initial encounter for closed fracture: Secondary | ICD-10-CM

## 2011-10-04 DIAGNOSIS — J301 Allergic rhinitis due to pollen: Secondary | ICD-10-CM

## 2011-10-04 DIAGNOSIS — R1310 Dysphagia, unspecified: Secondary | ICD-10-CM

## 2011-10-04 NOTE — Progress Notes (Signed)
  Subjective:    Patient ID: [name], female    DOB: 1933/08/31, 78 y.o.   MRN: [id number]  HPI Layonna returns for scheduled appointment she was seen at 102 and at that time for his complaints of pooling of saliva in the back of her throat and some difficulty with handling her secretions true dysphagia but did state that she lost about 20 pounds over the last when I saw her at 102 I scheduled her for a barium swallow and also for an allergy evaluation she also has a fracture to the proximal phalanx of her fifth finger attempts have been made for a referral to Dr. Cheree Ditto it. Apparently she has a significant outstanding bill and must resolve this prior to for this fracture she spoke with their office for lymphoma to person she needed to speak to that already gone home. She was given the number to call so she can speak with their office tomorrow and see if Dr. Cheree Ditto and we'll see her or not. If he will not be able to see her we'll need to refer to the hand Center for evaluation.    Review of Systems     Objective:   Physical Exam splint was reapplied to her finger fracture.        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 Will address this with an allergy evaluation and a barium swallow. She had a chest x-ray done that did not show any signs of lesions. Hopefully she will get in to see Dr. Amanda Pea and he can address the finger fracture she is suffering from.

## 2011-10-06 ENCOUNTER — Telehealth: Payer: Self-pay

## 2011-10-10 ENCOUNTER — Telehealth: Payer: Self-pay

## 2011-10-10 NOTE — Telephone Encounter (Signed)
PT WANTS DR DAUB TO KNOW THAT SHE IS WORKING ON GETTING IN TO DR GRAMIG(MONEY ISSUE) SHE THINKS THE APPT WILL BE WED   BEST PHONE [phone]

## 2011-10-10 NOTE — Telephone Encounter (Signed)
To MD, FYI

## 2011-10-11 NOTE — Telephone Encounter (Signed)
LMOM on pts cell with Dr Ellis Parents message

## 2011-10-12 ENCOUNTER — Telehealth: Payer: Self-pay

## 2011-10-12 NOTE — Telephone Encounter (Signed)
Pt took care of Dr. Ernesta Amble - Booked until May 24th.  Do you want her to wait that long for an appointment?   Pt needs four of her meds refilled ( she is completely out and not sure if you want her to continue taking these medications)   Pt also is looking for a referral for barium swallow.(she cancelled the last one)   Dr. Cleta Alberts

## 2011-10-14 ENCOUNTER — Other Ambulatory Visit (HOSPITAL_COMMUNITY): Payer: Self-pay | Admitting: Emergency Medicine

## 2011-10-14 NOTE — Telephone Encounter (Signed)
Mobile number was wrong number. LMOM at home # with info from Dr Cleta Alberts. Checked with Referrals and w/ [facility]. They are going to try to get pt scheduled to see Ortho soon.

## 2011-10-14 NOTE — Telephone Encounter (Signed)
Please call patient she cannot wait that long for referral to the orthopedist regarding her finger fracture. Please have the pharmacy send  a request for refill of the 4 medications she needs refill. If the barium swallow was already scheduled at the hospital she can call the hospital and pick a time to reschedule the x-ray. If she is not able to do this please call me back and we will reorder the barium swallow.

## 2011-10-15 NOTE — Telephone Encounter (Signed)
Pt is wanting to know info on what's going on with her rx she ran out last week pharmacy told her that they have not gotten a response from [place]. She also wanted to let Dr know she has an appt set for Thursday the 11th @ [place] 9:45am

## 2011-10-15 NOTE — Telephone Encounter (Signed)
LMOM for patient to CB with rx's she needs.  We have not received requests from pharmacy.

## 2011-10-16 NOTE — Telephone Encounter (Signed)
Pt would like a refill on the following medications:  Lisinopril 5mg  HCTZ 12.5 mg atrovastatin 80mg  Clopidogrel 75mg   Pharmacy: [facility]

## 2011-10-17 MED ORDER — CLOPIDOGREL BISULFATE 75 MG PO TABS: 75.0000 mg | ORAL_TABLET | Freq: Every day | ORAL | Status: DC

## 2011-10-17 MED ORDER — HYDROCHLOROTHIAZIDE 12.5 MG PO CAPS: 12.5000 mg | ORAL_CAPSULE | Freq: Every day | ORAL | Status: DC

## 2011-10-17 MED ORDER — LISINOPRIL 5 MG PO TABS: 5.0000 mg | ORAL_TABLET | Freq: Every day | ORAL | Status: DC

## 2011-10-17 MED ORDER — ATORVASTATIN CALCIUM 80 MG PO TABS: 80.0000 mg | ORAL_TABLET | Freq: Every day | ORAL | Status: DC

## 2011-10-17 NOTE — Telephone Encounter (Signed)
I sent in a 1 month supply for the patient.

## 2011-10-17 NOTE — Telephone Encounter (Signed)
Tried to call and let pt know RX was sent to pharmacy, phone number has been temporarily been disconnected.

## 2011-10-18 ENCOUNTER — Other Ambulatory Visit: Payer: Self-pay | Admitting: Emergency Medicine

## 2011-10-19 ENCOUNTER — Telehealth: Payer: Self-pay | Admitting: Family Medicine

## 2011-10-19 ENCOUNTER — Other Ambulatory Visit: Payer: Self-pay | Admitting: Emergency Medicine

## 2011-10-19 NOTE — Telephone Encounter (Signed)
Spoke with [facility] and clarified appt information.  Per S. Weber, notify patient about appt tomorrow 4/11 at 10am at [facility] for DG Esophagram.  Patient needs to have nothing to eat/drink after midnight tonight.  LMOM notifying patient, CB if ?'s.

## 2011-10-19 NOTE — Progress Notes (Signed)
Addended by: Benny Lennert L on: 10/19/2011 11:40 AM   Modules accepted: Orders

## 2011-10-19 NOTE — Telephone Encounter (Signed)
LMOM that Rxs were sent into pharmacy

## 2011-10-20 ENCOUNTER — Other Ambulatory Visit (HOSPITAL_COMMUNITY): Payer: Medicare Other

## 2011-10-20 ENCOUNTER — Ambulatory Visit (HOSPITAL_COMMUNITY)
Admission: RE | Admit: 2011-10-20 | Discharge: 2011-10-20 | Disposition: A | Discharge status: Home / Self Care | Payer: Medicare Other | Source: Ambulatory Visit | Attending: Physician Assistant | Admitting: Physician Assistant

## 2011-10-20 ENCOUNTER — Ambulatory Visit (HOSPITAL_COMMUNITY): Admission: RE | Admit: 2011-10-20 | Payer: Medicare Other | Source: Ambulatory Visit

## 2011-10-20 DIAGNOSIS — R634 Abnormal weight loss: Secondary | ICD-10-CM | POA: Insufficient documentation

## 2011-10-20 DIAGNOSIS — R6889 Other general symptoms and signs: Secondary | ICD-10-CM | POA: Insufficient documentation

## 2011-10-20 DIAGNOSIS — K224 Dyskinesia of esophagus: Secondary | ICD-10-CM | POA: Insufficient documentation

## 2011-10-20 DIAGNOSIS — R05 Cough: Secondary | ICD-10-CM | POA: Insufficient documentation

## 2011-10-20 DIAGNOSIS — R059 Cough, unspecified: Secondary | ICD-10-CM | POA: Insufficient documentation

## 2011-10-22 ENCOUNTER — Telehealth: Payer: Self-pay

## 2011-10-22 NOTE — Telephone Encounter (Signed)
PT HERE IN OFFICE

## 2011-10-22 NOTE — Telephone Encounter (Signed)
.  [facility] The patient called to request that she get two finger splints from our office since she cannot find them in drugstores and the one she was given is wearing out.  Please call patient to advise where she may purchase splint or if she may get one from our office.  Patient phone number is [phone].

## 2011-10-24 ENCOUNTER — Ambulatory Visit (INDEPENDENT_AMBULATORY_CARE_PROVIDER_SITE_OTHER): Payer: Medicare Other | Admitting: Family Medicine

## 2011-10-24 ENCOUNTER — Ambulatory Visit: Payer: Medicare Other

## 2011-10-24 VITALS — BP 147/95 | HR 65 | Temp 98.4°F | Resp 18 | Ht 58.5 in | Wt 110.4 lb

## 2011-10-24 DIAGNOSIS — M79609 Pain in unspecified limb: Secondary | ICD-10-CM

## 2011-10-24 DIAGNOSIS — S62609A Fracture of unspecified phalanx of unspecified finger, initial encounter for closed fracture: Secondary | ICD-10-CM

## 2011-10-24 NOTE — Progress Notes (Signed)
HPI patient was in her usual state of health until 3/22 when she accidentally struck her left hand on the side of the door. Since that time she has had pain and swelling in her left fifth finger especially at the PIP joint.  She was seen by Dr. Cleta Alberts and referred to Dr. Amanda Pea.  She has not been able to get an appointment and is here for splint replacement.  Patient has mild pain.   O:  Left pinkie finger is mildly swollen and angles out in abduction.  It is nontender and flexes into fist normally  [facility] reading (PRIMARY) by  Dr. Milus Glazier.  Left pinky:  Good callus, no displacement  A:  Healing well  P:  Recheck here in 2 weeks Continue to buddy tape

## 2011-11-03 ENCOUNTER — Ambulatory Visit: Payer: Medicare Other

## 2011-11-03 ENCOUNTER — Ambulatory Visit (INDEPENDENT_AMBULATORY_CARE_PROVIDER_SITE_OTHER): Payer: Medicare Other | Admitting: Family Medicine

## 2011-11-03 VITALS — BP 145/87 | HR 66 | Temp 97.4°F | Resp 16 | Ht 58.5 in | Wt 109.4 lb

## 2011-11-03 DIAGNOSIS — S62609A Fracture of unspecified phalanx of unspecified finger, initial encounter for closed fracture: Secondary | ICD-10-CM

## 2011-11-03 NOTE — Patient Instructions (Signed)
Continue to buddy tape the fingers. Return in 2 weeks.

## 2011-11-03 NOTE — Progress Notes (Signed)
Subjective: Patient is here for followup with regard to her broken finger. It's not hurting her, but the one next to it bothers her some because she is buddy taping them.  Objective: Reviewed old film which shows the fracture with early callus. It was a comminuted fracture. The finger is only mildly tender. She has good flexion of it.  Assessment:  Fracture left fifth finger  Plan: X-ray [facility] reading (PRIMARY) by  Dr. Alwyn Ren Fracture proximal phalanx fifth finger left hand with good callus formation. Alignment still looks fine..  Will continue to buddy tape the fingers for 2 more weeks due to the complexity of the fracture, and return for recheck at that time.

## 2011-11-18 ENCOUNTER — Ambulatory Visit (INDEPENDENT_AMBULATORY_CARE_PROVIDER_SITE_OTHER): Payer: Medicare Other | Admitting: Family Medicine

## 2011-11-18 VITALS — BP 146/81 | HR 71 | Temp 97.3°F | Resp 16 | Ht 58.5 in | Wt 107.0 lb

## 2011-11-18 DIAGNOSIS — S62609A Fracture of unspecified phalanx of unspecified finger, initial encounter for closed fracture: Secondary | ICD-10-CM

## 2011-11-18 NOTE — Progress Notes (Signed)
Subjective: Patient has continued to take care of her finger, and it is doing better. She still has a little trouble with full extension of the finger but it is fairly satisfied. She has postponed her orthopedic referral, and wanted to know if she still needs to go.  Objective: Bone is nontender. Can still feel a little knobby callus of the proximal phalanx. No swelling. There is about a 15 failure to fully straighten the finger though she has extension and has flexion.  Assessment: Fracture left fifth finger  Plan: Discussed with patient that there probably is no value at this point in seeing the orthopedist, as surgical correction of that deformity would not particular benefit her at this stage. She is content with that. She is advised to use a bulb to grip and strength in that hand. No longer needs to wear the buddy tape. Return if needed.

## 2011-11-18 NOTE — Patient Instructions (Signed)
Exercise with a ball to strengthen finger  Return if problems

## 2011-12-02 ENCOUNTER — Other Ambulatory Visit: Payer: Self-pay | Admitting: Physician Assistant

## 2011-12-14 ENCOUNTER — Ambulatory Visit (INDEPENDENT_AMBULATORY_CARE_PROVIDER_SITE_OTHER): Payer: Medicare Other | Admitting: Emergency Medicine

## 2011-12-14 VITALS — BP 155/82 | HR 69 | Temp 98.0°F | Resp 16 | Wt 111.0 lb

## 2011-12-14 DIAGNOSIS — Z139 Encounter for screening, unspecified: Secondary | ICD-10-CM

## 2011-12-14 DIAGNOSIS — R634 Abnormal weight loss: Secondary | ICD-10-CM

## 2011-12-14 LAB — POCT CBC
Granulocyte percent: 62 %G (ref 37–80)
HCT, POC: 38.6 % (ref 37.7–47.9)
Hemoglobin: 12.5 g/dL (ref 12.2–16.2)
MCH, POC: 30.3 pg (ref 27–31.2)
MCV: 93.6 fL (ref 80–97)
Platelet Count, POC: 180 10*3/uL (ref 142–424)
RBC: 4.12 M/uL (ref 4.04–5.48)
WBC: 4.8 10*3/uL (ref 4.6–10.2)

## 2011-12-14 NOTE — Progress Notes (Signed)
  Subjective:    Patient ID: [name], female    DOB: Nov 23, 1933, 78 y.o.   MRN: [id number]  HPI patient in the followup weight loss. She has a complicated history in that she has had a subdural hematoma in the past and a stroke in the past. About 5 years ago she had an abnormal CT of the abdomen and subsequently underwent an air contrast BE which was okay except for diverticulosis. She appears she has not had a mammogram for 1-1/2 years    Review of Systems     Objective:   Physical Exam patient does not appear acutely ill. She is alert and cooperative. Her neck is supple she does have a scoliotic curve her chest is clear. Heart is regular rate without murmurs. Abdominal exam reveals a large midline scar with a hernia to the left of the scar which is reducible. There are no abnormal nodes palpable. Results for orders placed in visit on 12/14/11  POCT CBC      Component Value Range   WBC 4.8  4.6 - 10.2 (K/uL)   Lymph, poc 1.4  0.6 - 3.4    POC LYMPH PERCENT 29.3  10 - 50 (%L)   MID (cbc) 0.4  0 - 0.9    POC MID % 8.7  0 - 12 (%M)   POC Granulocyte 3.0  2 - 6.9    Granulocyte percent 62.0  37 - 80 (%G)   RBC 4.12  4.04 - 5.48 (M/uL)   Hemoglobin 12.5  12.2 - 16.2 (g/dL)   HCT, POC 95.2  84.1 - 47.9 (%)   MCV 93.6  80 - 97 (fL)   MCH, POC 30.3  27 - 31.2 (pg)   MCHC 32.4  31.8 - 35.4 (g/dL)   RDW, POC 32.4     Platelet Count, POC 180  142 - 424 (K/uL)   MPV 10.7  0 - 99.8 (fL)         Assessment & Plan:  We'll go ahead and check labs for weight loss. Have advised her to pick up a high-protein low-fat drinks to try and see if this would help put on some weight. She's had a chest x-ray and a barium swallow done.

## 2011-12-14 NOTE — Patient Instructions (Signed)
Advised to pick up a high-protein low-fat drinks either boost or Ensure or muscle milk.

## 2011-12-15 LAB — COMPREHENSIVE METABOLIC PANEL
Albumin: 4.4 g/dL (ref 3.5–5.2)
Alkaline Phosphatase: 70 U/L (ref 39–117)
CO2: 29 mEq/L (ref 19–32)
Calcium: 10.2 mg/dL (ref 8.4–10.5)
Chloride: 103 mEq/L (ref 96–112)
Glucose, Bld: 76 mg/dL (ref 70–99)
Potassium: 4.3 mEq/L (ref 3.5–5.3)
Sodium: 141 mEq/L (ref 135–145)
Total Protein: 7.3 g/dL (ref 6.0–8.3)

## 2011-12-15 LAB — T4, FREE: Free T4: 1.3 ng/dL (ref 0.80–1.80)

## 2011-12-18 ENCOUNTER — Encounter: Payer: Self-pay | Admitting: Family Medicine

## 2011-12-29 ENCOUNTER — Ambulatory Visit
Admission: RE | Admit: 2011-12-29 | Discharge: 2011-12-29 | Disposition: A | Discharge status: Home / Self Care | Payer: Medicare Other | Source: Ambulatory Visit | Attending: Emergency Medicine | Admitting: Emergency Medicine

## 2011-12-29 DIAGNOSIS — Z139 Encounter for screening, unspecified: Secondary | ICD-10-CM

## 2011-12-30 ENCOUNTER — Telehealth: Payer: Self-pay | Admitting: Radiology

## 2011-12-30 NOTE — Telephone Encounter (Signed)
lmom for pt to cb

## 2011-12-30 NOTE — Telephone Encounter (Signed)
Message copied by Levon Hedger A on Fri Dec 30, 2011 10:51 AM ------      Message from: Lesle Chris A      Created: Fri Dec 30, 2011  9:10 AM       Please call patient there is a possible mass in her left breast. Please be sure she goes by and has further x-rays done of that breast.

## 2011-12-30 NOTE — Progress Notes (Signed)
lmom for pt to cb

## 2012-01-03 ENCOUNTER — Other Ambulatory Visit: Payer: Self-pay | Admitting: Emergency Medicine

## 2012-01-03 DIAGNOSIS — R928 Other abnormal and inconclusive findings on diagnostic imaging of breast: Secondary | ICD-10-CM

## 2012-01-11 ENCOUNTER — Ambulatory Visit
Admission: RE | Admit: 2012-01-11 | Discharge: 2012-01-11 | Disposition: A | Discharge status: Home / Self Care | Payer: Medicare Other | Source: Ambulatory Visit | Attending: Emergency Medicine | Admitting: Emergency Medicine

## 2012-01-11 DIAGNOSIS — R928 Other abnormal and inconclusive findings on diagnostic imaging of breast: Secondary | ICD-10-CM

## 2012-01-16 ENCOUNTER — Other Ambulatory Visit: Payer: Self-pay | Admitting: Physician Assistant

## 2012-02-06 ENCOUNTER — Inpatient Hospital Stay (HOSPITAL_COMMUNITY)
Admission: EM | Admit: 2012-02-06 | Discharge: 2012-02-08 | DRG: 249 | Disposition: A | Discharge status: Home / Self Care | Payer: Medicare Other | Attending: Internal Medicine | Admitting: Internal Medicine

## 2012-02-06 ENCOUNTER — Ambulatory Visit (INDEPENDENT_AMBULATORY_CARE_PROVIDER_SITE_OTHER): Payer: Medicare Other | Admitting: Family Medicine

## 2012-02-06 ENCOUNTER — Emergency Department (HOSPITAL_COMMUNITY): Payer: Medicare Other

## 2012-02-06 ENCOUNTER — Other Ambulatory Visit: Payer: Self-pay

## 2012-02-06 ENCOUNTER — Encounter (HOSPITAL_COMMUNITY): Payer: Self-pay | Admitting: Physical Medicine and Rehabilitation

## 2012-02-06 VITALS — BP 156/90 | HR 79 | Temp 98.3°F | Resp 16 | Ht <= 58 in | Wt 113.0 lb

## 2012-02-06 DIAGNOSIS — R9431 Abnormal electrocardiogram [ECG] [EKG]: Secondary | ICD-10-CM

## 2012-02-06 DIAGNOSIS — Z87891 Personal history of nicotine dependence: Secondary | ICD-10-CM

## 2012-02-06 DIAGNOSIS — K5730 Diverticulosis of large intestine without perforation or abscess without bleeding: Secondary | ICD-10-CM | POA: Diagnosis present

## 2012-02-06 DIAGNOSIS — K4090 Unilateral inguinal hernia, without obstruction or gangrene, not specified as recurrent: Secondary | ICD-10-CM | POA: Diagnosis present

## 2012-02-06 DIAGNOSIS — I10 Essential (primary) hypertension: Secondary | ICD-10-CM | POA: Diagnosis present

## 2012-02-06 DIAGNOSIS — Z7982 Long term (current) use of aspirin: Secondary | ICD-10-CM

## 2012-02-06 DIAGNOSIS — M810 Age-related osteoporosis without current pathological fracture: Secondary | ICD-10-CM | POA: Diagnosis present

## 2012-02-06 DIAGNOSIS — Z955 Presence of coronary angioplasty implant and graft: Secondary | ICD-10-CM

## 2012-02-06 DIAGNOSIS — Z7902 Long term (current) use of antithrombotics/antiplatelets: Secondary | ICD-10-CM

## 2012-02-06 DIAGNOSIS — I200 Unstable angina: Secondary | ICD-10-CM | POA: Diagnosis present

## 2012-02-06 DIAGNOSIS — I2510 Atherosclerotic heart disease of native coronary artery without angina pectoris: Principal | ICD-10-CM | POA: Diagnosis present

## 2012-02-06 DIAGNOSIS — Z79899 Other long term (current) drug therapy: Secondary | ICD-10-CM

## 2012-02-06 DIAGNOSIS — Z8673 Personal history of transient ischemic attack (TIA), and cerebral infarction without residual deficits: Secondary | ICD-10-CM

## 2012-02-06 DIAGNOSIS — Z8679 Personal history of other diseases of the circulatory system: Secondary | ICD-10-CM

## 2012-02-06 DIAGNOSIS — E785 Hyperlipidemia, unspecified: Secondary | ICD-10-CM | POA: Diagnosis present

## 2012-02-06 DIAGNOSIS — R079 Chest pain, unspecified: Secondary | ICD-10-CM

## 2012-02-06 DIAGNOSIS — Z8249 Family history of ischemic heart disease and other diseases of the circulatory system: Secondary | ICD-10-CM

## 2012-02-06 DIAGNOSIS — R569 Unspecified convulsions: Secondary | ICD-10-CM | POA: Diagnosis present

## 2012-02-06 HISTORY — DX: Heart disease, unspecified: I51.9

## 2012-02-06 HISTORY — DX: Atherosclerotic heart disease of native coronary artery without angina pectoris: I25.10

## 2012-02-06 HISTORY — DX: Unspecified osteoarthritis, unspecified site: M19.90

## 2012-02-06 LAB — BASIC METABOLIC PANEL
BUN: 13 mg/dL (ref 6–23)
GFR calc Af Amer: >90 mL/min (ref >90)
GFR calc non Af Amer: 81 mL/min — ABNORMAL LOW (ref >90)
Potassium: 3.8 mEq/L (ref 3.5–5.1)
Sodium: 144 mEq/L (ref 135–145)

## 2012-02-06 LAB — CBC
MCHC: 33.6 g/dL (ref 30.0–36.0)
Platelets: 180 10*3/uL (ref 150–400)
RDW: 14.2 % (ref 11.5–15.5)

## 2012-02-06 LAB — CK TOTAL AND CKMB (NOT AT ARMC)
Relative Index: 2.2 (ref 0.0–2.5)
Total CK: 144 U/L (ref 7–177)

## 2012-02-06 LAB — POCT I-STAT TROPONIN I

## 2012-02-06 MED ORDER — ENOXAPARIN SODIUM 40 MG/0.4ML ~~LOC~~ SOLN
40.0000 mg | SUBCUTANEOUS | Status: DC
Start: 2012-02-06 — End: 2012-02-07
  Administered 2012-02-07: 40 mg via SUBCUTANEOUS
  Filled 2012-02-06 (×2): qty 0.4

## 2012-02-06 MED ORDER — ASPIRIN EC 81 MG PO TBEC: 81.0000 mg | DELAYED_RELEASE_TABLET | Freq: Every day | ORAL | Status: DC

## 2012-02-06 MED ORDER — ASPIRIN 81 MG PO CHEW
81.0000 mg | CHEWABLE_TABLET | Freq: Once | ORAL | Status: AC
  Administered 2012-02-06: 81 mg via ORAL

## 2012-02-06 MED ORDER — ASPIRIN 81 MG PO CHEW: 324.0000 mg | CHEWABLE_TABLET | Freq: Once | ORAL | Status: DC

## 2012-02-06 MED ORDER — LOPERAMIDE HCL 2 MG PO CAPS: 2.0000 mg | ORAL_CAPSULE | Freq: Four times a day (QID) | ORAL | Status: DC | PRN

## 2012-02-06 MED ORDER — HEPARIN BOLUS VIA INFUSION: 3000.0000 [IU] | Freq: Once | INTRAVENOUS | Status: DC

## 2012-02-06 MED ORDER — ASPIRIN EC 81 MG PO TBEC
81.0000 mg | DELAYED_RELEASE_TABLET | Freq: Every day | ORAL | Status: DC
  Administered 2012-02-08: 10:00:00 81 mg via ORAL
  Filled 2012-02-06 (×2): qty 1

## 2012-02-06 MED ORDER — ATORVASTATIN CALCIUM 80 MG PO TABS
80.0000 mg | ORAL_TABLET | Freq: Every day | ORAL | Status: DC
  Administered 2012-02-07 – 2012-02-08 (×3): 80 mg via ORAL
  Filled 2012-02-06 (×5): qty 1

## 2012-02-06 MED ORDER — HYDROCHLOROTHIAZIDE 12.5 MG PO CAPS
12.5000 mg | ORAL_CAPSULE | Freq: Every day | ORAL | Status: DC
  Administered 2012-02-08: 10:00:00 12.5 mg via ORAL
  Filled 2012-02-06 (×2): qty 1

## 2012-02-06 MED ORDER — ASPIRIN 81 MG PO TABS: 81.0000 mg | ORAL_TABLET | Freq: Every day | ORAL | Status: DC

## 2012-02-06 MED ORDER — LOPERAMIDE HCL 2 MG PO TABS
2.0000 mg | ORAL_TABLET | Freq: Four times a day (QID) | ORAL | Status: DC | PRN
Start: 2012-02-06 — End: 2012-02-06

## 2012-02-06 MED ORDER — PSYLLIUM 95 % PO PACK: 1.0000 | PACK | Freq: Every day | ORAL | Status: DC

## 2012-02-06 MED ORDER — PSYLLIUM 95 % PO PACK
1.0000 | PACK | Freq: Every day | ORAL | Status: DC
  Administered 2012-02-08: 10:00:00 1 via ORAL
  Filled 2012-02-06 (×2): qty 1

## 2012-02-06 MED ORDER — CLOPIDOGREL BISULFATE 75 MG PO TABS
75.0000 mg | ORAL_TABLET | Freq: Every day | ORAL | Status: DC
  Administered 2012-02-08: 10:00:00 75 mg via ORAL
  Filled 2012-02-06: qty 1

## 2012-02-06 MED ORDER — OMEGA-3-ACID ETHYL ESTERS 1 G PO CAPS
2.0000 g | ORAL_CAPSULE | Freq: Two times a day (BID) | ORAL | Status: DC
  Administered 2012-02-07 – 2012-02-08 (×3): 2 g via ORAL
  Filled 2012-02-06 (×5): qty 2

## 2012-02-06 MED ORDER — LISINOPRIL 5 MG PO TABS
5.0000 mg | ORAL_TABLET | Freq: Every day | ORAL | Status: DC
  Filled 2012-02-06 (×2): qty 1

## 2012-02-06 MED ORDER — HEPARIN (PORCINE) IN NACL 100-0.45 UNIT/ML-% IJ SOLN
600.0000 [IU]/h | INTRAMUSCULAR | Status: DC
  Filled 2012-02-06: qty 250

## 2012-02-06 MED ORDER — ALUM & MAG HYDROXIDE-SIMETH 200-200-20 MG/5ML PO SUSP: 15.0000 mL | Freq: Four times a day (QID) | ORAL | Status: DC | PRN

## 2012-02-06 MED ORDER — OMEGA-3 FATTY ACIDS 1000 MG PO CAPS: 2.0000 g | ORAL_CAPSULE | Freq: Two times a day (BID) | ORAL | Status: DC

## 2012-02-06 MED ORDER — ENOXAPARIN SODIUM 40 MG/0.4ML ~~LOC~~ SOLN: 40.0000 mg | SUBCUTANEOUS | Status: DC

## 2012-02-06 NOTE — ED Notes (Signed)
MD with pt  

## 2012-02-06 NOTE — Progress Notes (Signed)
IV insertion attempts x 2 unsuccessful. EMS arrived before further attempts made. Patient tolerated well.

## 2012-02-06 NOTE — ED Notes (Signed)
Pt presents to department for evaluation of midsternal non radiating chest pain. Ongoing x2 days. Pt states pain becomes worse on exertion, is relieved by rest. Respirations unlabored. Lung sounds clear and equal bilaterally. She is conscious alert and oriented x4. Denies CP at the time.

## 2012-02-06 NOTE — H&P (Signed)
Patient ID: [name] MRN: [id number], DOB/AGE: 76-22-1935   Admit date: 02/06/2012 Date of Consult: @TODAY @  Primary Physician: Lucilla Edin, MD Primary Cardiologist: New    Problem List: Past Medical History  Diagnosis Date  . Hypertension   . Seizures   . CVA (cerebral vascular accident)   . Diverticulosis   . Subdural hematoma   . Pyelonephritis   . Abnormal CT of the abdomen     Nodes  resolved  . Inguinal hernia     Right  . Diverticulosis   . Osteoporosis     Past Surgical History  Procedure Date  . Tonsillectomy and adenoidectomy   . Cesarean section     X 2  . Craniotomy 2004  . Total abdominal hysterectomy 1985    FIBROID     Allergies:  Allergies  Allergen Reactions  . Latex Itching  . Flagyl (Metronidazole) Rash    HPI: Patient is a 78 year old with a history of HTN, HL, CVA.  Seen by Dr. Milus Glazier today Complained of R sided CP on Saturday 7/27.  Also dull pain R moth and side of head.   Patient reports she had been feeling ok until Saturday.  Sat night she was doing hand laundry.  Developed epigastric to mid chest discomfort--aching.  Went to R sided of chest.  No SOB  No diaphoresis.  No nausea.  Went away.   Came back one time. Developed back "heat" as well.  Like hot flash.  Used cold compress  Went away  Patient went to bed  Slept OK   Sunday  Felt OK  In am Micah Flesher to church on Sunday.  Sunday night had disagreement with son.  Then developed again similar symptoms after.  Slight back heat.  Used wash cloth.  Went away on own. Went to bed. THis morning OK until about to go to meeting .  Rushed.  Running late.  Went to corner.  As rushing felt it coming again.  Went away on own [place] to urgent care.  Patient denies DOE.  No SOB.  No PND.  No palpitations   Inpatient Medications:    . aspirin  81 mg Oral Once  . DISCONTD: aspirin  324 mg Oral Once    No family history on file.   History   Social History  . Marital Status: Single   Spouse Name: N/A    Number of Children: N/A  . Years of Education: N/A   Occupational History  . Not on file.   Social History Main Topics  . Smoking status: Former Smoker    Types: Cigarettes  . Smokeless tobacco: Not on file   Comment: quit 2004  . Alcohol Use: Yes     occasional  . Drug Use: Not on file  . Sexually Active: Not on file   Other Topics Concern  . Not on file   Social History Narrative  . No narrative on file     Review of Systems: General: negative for chills, fever, night sweats or weight changes.  Cardiovascular: negative for chest pain, dyspnea on exertion, edema, orthopnea, palpitations, paroxysmal nocturnal dyspnea or shortness of breath Dermatological: negative for rash Respiratory: negative for cough or wheezing Urologic: negative for hematuria Abdominal: negative for nausea, vomiting, diarrhea, bright red blood per rectum, melena, or hematemesis Neurologic: negative for visual changes, syncope, or dizziness All other systems reviewed and are otherwise negative except as noted above.  Physical Exam: Filed Vitals:   02/06/12  1526  BP: 138/100  Pulse: 66  Temp:   Resp: 15   No intake or output data in the 24 hours ending 02/06/12 1805  General: Well developed, well nourished, in no acute distress. Head: Normocephalic, atraumatic, sclera non-icteric Neck: Negative for carotid bruits. JVP not elevated. Lungs: Clear bilaterally to auscultation without wheezes, rales, or rhonchi. Breathing is unlabored. Heart: RRR with S1 S2. No murmurs, rubs, or gallops appreciated. Abdomen: Soft, non-tender, non-distended with normoactive bowel sounds. No hepatomegaly. No rebound/guarding. No obvious abdominal masses. Msk:  Strength and tone appears normal for age. Extremities: No clubbing, cyanosis or edema.  Distal pedal pulses are 2+ and equal bilaterally. Neuro: Alert and oriented X 3. Moves all extremities spontaneously. Psych:  Responds to questions  appropriately with a normal affect.  Labs: Results for orders placed during the hospital encounter of 02/06/12 (from the past 24 hour(s))  CBC     Status: Normal   Collection Time   02/06/12  2:41 PM      Component Value Range   WBC 5.8  4.0 - 10.5 K/uL   RBC 4.74  3.87 - 5.11 MIL/uL   Hemoglobin 14.6  12.0 - 15.0 g/dL   HCT 84.1  32.4 - 40.1 %   MCV 91.8  78.0 - 100.0 fL   MCH 30.8  26.0 - 34.0 pg   MCHC 33.6  30.0 - 36.0 g/dL   RDW 02.7  25.3 - 66.4 %   Platelets 180  150 - 400 K/uL  BASIC METABOLIC PANEL     Status: Abnormal   Collection Time   02/06/12  2:41 PM      Component Value Range   Sodium 144  135 - 145 mEq/L   Potassium 3.8  3.5 - 5.1 mEq/L   Chloride 107  96 - 112 mEq/L   CO2 31  19 - 32 mEq/L   Glucose, Bld 84  70 - 99 mg/dL   BUN 13  6 - 23 mg/dL   Creatinine, Ser 4.03  0.50 - 1.10 mg/dL   Calcium 9.7  8.4 - 47.4 mg/dL   GFR calc non Af Amer 81 (*) >90 mL/min   GFR calc Af Amer >90  >90 mL/min  CK TOTAL AND CKMB     Status: Normal   Collection Time   02/06/12  2:41 PM      Component Value Range   Total CK 144  7 - 177 U/L   CK, MB 3.2  0.3 - 4.0 ng/mL   Relative Index 2.2  0.0 - 2.5  POCT I-STAT TROPONIN I     Status: Normal   Collection Time   02/06/12  3:38 PM      Component Value Range   Troponin i, poc 0.06  0.00 - 0.08 ng/mL   Comment 3           POCT I-STAT TROPONIN I     Status: Normal   Collection Time   02/06/12  5:47 PM      Component Value Range   Troponin i, poc 0.05  0.00 - 0.08 ng/mL   Comment 3             Radiology/Studies: Dg Chest Port 1 View  02/06/2012  *RADIOLOGY REPORT*  Clinical Data: Chest pain  PORTABLE CHEST - 1 VIEW  Comparison: 09/30/2011; 04/12/2011; 11/24/2004  Findings:  Grossly unchanged cardiac silhouette and mediastinal contours with mild ectasia of the thoracic aorta.  The lungs again appear hyperinflated.  There is mild diffuse thickening of the pulmonary interstitium.  No focal airspace opacity.  No definite pleural  effusion or pneumothorax.  Grossly unchanged bones.  IMPRESSION: Hyperexpanded lungs with mild thickening of the pulmonary interstitium, nonspecific but may be seen in the setting of airways disease, though atypical infection may have a similar appearance.  Original Report Authenticated By: Waynard Reeds, M.D.   [employer] [facility]     EKG:  SR 76 bpm.  Sl ST elevation V1, V2 with marked T wave inversion V1-V4; T wave inversion I, AVL.  T wave changes present on EKG 04/15/11 but more prominent now.  ASSESSMENT AND PLAN:   Patient is a 78 year old with a history of HTN, subdural hematoma, CVA.  Presents with 3 day history of CP.  EKG at Urgent care with Sl ST elevation and Twave inversion.  More pronounced than previous.   Currently without pain  I wouldrecomm admit and R/O for MI  Rx ASA.\ Continue statin. With history of remote subdural hematoma (2004) would hold heparin for now.  If recurrent pain or positive enzymes would use. Plan for cath in AM.  Discussed risks/benefits.  Patient understands and agrees to proceed.  2.  HTN  Follow.  3.  Hx CVA  Follow  4.  Hx subdural hematoma.  As noted above.  5.  Hx seizures  Follow.   SignedDietrich Pates 02/06/2012, 6:05 PM

## 2012-02-06 NOTE — ED Notes (Signed)
Pt presents to department via [facility] for midsternal non radiating chest pain. Ongoing x2 days. Denies SOB. Pain increases with exertion. Denies pain upon arrival to ED. Was seen by PCP today, EKG changes noted. Pt is alert and oriented x4. 324 ASA per PCP.

## 2012-02-06 NOTE — ED Provider Notes (Signed)
History     CSN: [id number]  Arrival date & time 02/06/12  1354   First MD Initiated Contact with Patient 02/06/12 1409      Chief Complaint  Patient presents with  . Chest Pain    (Consider location/radiation/quality/duration/timing/severity/associated sxs/prior treatment) HPI Comments: Pt presents via EMS from a local urgent care for evaluation of chest pain.  She reports having an episode of moderate to severe chest pain beginning this morning while walking to the bus stop.  She describes central and right-sided pressure that radiates to her back and the right side of her neck that only resolved after she sat down for a while.  She had 2 lesser but similar episodes at home 2 day ago that also resolved with rest.  Prior to then, she had experience no issues with chest pain.  Patient is a 78 y.o. female presenting with chest pain. The history is provided by the patient. No language interpreter was used.  Chest Pain The chest pain began 3 - 5 hours ago. Duration of episode(s) is 30 minutes. The chest pain is resolved. The pain is associated with exertion. At its most intense, the pain is at 8/10. The quality of the pain is described as pressure-like, sharp, heavy and tightness. The pain radiates to the mid back and right neck. Chest pain is worsened by exertion. Primary symptoms include altered mental status. Pertinent negatives for primary symptoms include no fever, no fatigue, no syncope, no shortness of breath, no cough, no wheezing, no palpitations, no abdominal pain, no nausea, no vomiting and no dizziness.  Pertinent negatives for associated symptoms include no claudication, no diaphoresis, no lower extremity edema, no near-syncope, no orthopnea and no weakness. She tried nothing for the symptoms. Risk factors include being elderly.  Her past medical history is significant for hyperlipidemia, hypertension, strokes and TIA.  Her family medical history is significant for hypertension in  family.     Past Medical History  Diagnosis Date  . Hypertension   . Seizures   . CVA (cerebral vascular accident)   . Diverticulosis   . Subdural hematoma   . Pyelonephritis   . Abnormal CT of the abdomen     Nodes  resolved  . Inguinal hernia     Right  . Diverticulosis   . Osteoporosis     Past Surgical History  Procedure Date  . Tonsillectomy and adenoidectomy   . Cesarean section     X 2  . Craniotomy 2004  . Total abdominal hysterectomy 1985    FIBROID    No family history on file.  History  Substance Use Topics  . Smoking status: Former Smoker    Types: Cigarettes  . Smokeless tobacco: Not on file   Comment: quit 2004  . Alcohol Use: Yes     occasional    OB History    Grav Para Term Preterm Abortions TAB SAB Ect Mult Living                  Review of Systems  Constitutional: Negative for fever, diaphoresis and fatigue.  HENT: Negative.   Respiratory: Negative for cough, shortness of breath and wheezing.   Cardiovascular: Positive for chest pain. Negative for palpitations, orthopnea, claudication, syncope and near-syncope.  Gastrointestinal: Negative for nausea, vomiting, abdominal pain, diarrhea and constipation.  Genitourinary: Negative for dysuria.  Musculoskeletal: Negative for myalgias, back pain, joint swelling, arthralgias and gait problem.  Skin: Negative for color change, pallor, rash and wound.  Neurological:  Negative for dizziness, tremors, syncope, weakness and headaches.  Psychiatric/Behavioral: Positive for altered mental status. Negative for dysphoric mood. The patient is not nervous/anxious.     Allergies  Latex and Flagyl  Home Medications   Current Outpatient Rx  Name Route Sig Dispense Refill  . ALUM & MAG HYDROXIDE-SIMETH 200-200-20 MG/5ML PO SUSP Oral Take 15 mLs by mouth every 6 (six) hours as needed. As needed for gas.    . ASPIRIN 81 MG PO CHEW Oral Chew 324 mg by mouth daily.    . ASPIRIN 81 MG PO TABS Oral Take 81  mg by mouth daily.    . ATORVASTATIN CALCIUM 80 MG PO TABS Oral Take 80 mg by mouth every evening.    Marland Kitchen CALCIUM PO Oral Take by mouth.    . CLOPIDOGREL BISULFATE 75 MG PO TABS  take 1 tablet by mouth once daily 30 tablet 1  . OMEGA-3 FATTY ACIDS 1000 MG PO CAPS Oral Take 2 g by mouth 2 (two) times daily.     Marland Kitchen HYDROCHLOROTHIAZIDE 12.5 MG PO CAPS  take 1 capsule by mouth once daily 30 capsule 1  . LISINOPRIL 5 MG PO TABS  take 1 tablet by mouth once daily 30 tablet 1  . LOPERAMIDE HCL 2 MG PO TABS Oral Take 2 mg by mouth 4 (four) times daily as needed. As needed for loose stools.    . PSYLLIUM 58.6 % PO POWD Oral Take 1 packet by mouth daily.       BP 133/90  Pulse 69  Temp 96.5 F (35.8 C) (Oral)  Resp 14  SpO2 94%  Physical Exam  Constitutional: She is oriented to person, place, and time. She appears well-developed and well-nourished. No distress. She is not intubated.  HENT:  Head: Normocephalic and atraumatic.  Right Ear: External ear normal.  Left Ear: External ear normal.  Nose: Nose normal.  Mouth/Throat: Oropharynx is clear and moist. No oropharyngeal exudate.  Eyes: Conjunctivae and EOM are normal. Pupils are equal, round, and reactive to light. Right eye exhibits no discharge. Left eye exhibits no discharge. No scleral icterus.  Neck: Normal range of motion. Neck supple. No JVD present. No tracheal deviation present. No thyromegaly present.  Cardiovascular: Normal rate, regular rhythm, S1 normal, S2 normal, normal heart sounds and intact distal pulses.  Exam reveals no gallop, no distant heart sounds and no friction rub.   No murmur heard. Pulmonary/Chest: Effort normal and breath sounds normal. No accessory muscle usage or stridor. No apnea, not tachypneic and not bradypneic. She is not intubated. No respiratory distress. She has no decreased breath sounds. She has no wheezes. She has no rhonchi. She has no rales. She exhibits no tenderness.  Abdominal: Soft. Bowel sounds are  normal. She exhibits no distension and no mass. There is no tenderness. There is no rebound and no guarding.  Musculoskeletal: Normal range of motion. She exhibits no edema and no tenderness.  Lymphadenopathy:    She has no cervical adenopathy.  Neurological: She is alert and oriented to person, place, and time. No cranial nerve deficit.  Skin: Skin is warm and dry. No rash noted. She is not diaphoretic. No erythema. No pallor.  Psychiatric: She has a normal mood and affect. Her behavior is normal. Judgment and thought content normal.    ED Course  Procedures (including critical care time)   Labs Reviewed  CBC  BASIC METABOLIC PANEL  CK TOTAL AND CKMB   No results found.   No  diagnosis found.   Date: 02/06/2012  Rate: 67 bpm  Rhythm: sinus  QRS Axis: normal  Intervals: normal  ST/T Wave abnormalities: + borderline ST elevation V2 with deep t-wave inversions V2 and V3, and sm inverted T in lead 1  Conduction Disutrbances:none  Narrative Interpretation: T inversions not seen on 04/14/11 study  Old EKG Reviewed: changes noted      MDM  Pt presents for evaluation of chest pain.  Consulted cardiologist on'call as her EKG is concerning for ischemia without meeting STEMI criteria.  Plan telemetry, serial exams, and routine chest pain evaluation.  Pt has multiple cardiac risk factors, as results become available, plan admission for further management.  Cardiac markers are negative x1, plan admit for serial markers and stress test.        Tobin Chad, MD 02/06/12 [phone]

## 2012-02-06 NOTE — Progress Notes (Signed)
ANTICOAGULATION CONSULT NOTE - Initial Consult  Pharmacy Consult for heparin Indication: chest pain/ACS  Allergies  Allergen Reactions  . Latex Itching  . Flagyl (Metronidazole) Rash    Patient Measurements:   Heparin Dosing Weight: 51.3kg  Vital Signs: Temp: 96.5 F (35.8 C) (07/29 1403) Temp src: Oral (07/29 1403) BP: 138/100 mmHg (07/29 1526) Pulse Rate: 66  (07/29 1526)  Labs:  Basename 02/06/12 1441  HGB 14.6  HCT 43.5  PLT 180  APTT --  LABPROT --  INR --  HEPARINUNFRC --  CREATININE 0.72  CKTOTAL 144  CKMB 3.2  TROPONINI --    The CrCl is unknown because both a height and weight (above a minimum accepted value) are required for this calculation.   Medical History: Past Medical History  Diagnosis Date  . Hypertension   . Seizures   . CVA (cerebral vascular accident)   . Diverticulosis   . Subdural hematoma   . Pyelonephritis   . Abnormal CT of the abdomen     Nodes  resolved  . Inguinal hernia     Right  . Diverticulosis   . Osteoporosis     Medications:   (Not in a hospital admission)  Assessment: 29 yof presented to the ED from urgent care with chest pain. Baseline CBC is WNL, CE's WNL. Plan to initiate heparin for now. Noted history of head bleed and CVA.   Goal of Therapy:  Heparin level 0.3-0.7 units/ml Monitor platelets by anticoagulation protocol: Yes   Plan:  1. Heparin bolus 3000 units IV x 1 2. Heparin gtt 600 units/hr 3. Check an 8 hour heparin level 4. Daily heparin level and CBC  Quadir Muns, Drake Leach 02/06/2012,6:44 PM

## 2012-02-06 NOTE — ED Notes (Signed)
Dinner tray ordered for pt.  Pt eating at this time. No complaints of pain,

## 2012-02-06 NOTE — ED Notes (Signed)
Pt resting quietly at this time.  Pt denies any pain or discomfort.   

## 2012-02-06 NOTE — ED Notes (Signed)
Pt walked to bathroom 

## 2012-02-06 NOTE — Progress Notes (Signed)
78 yo woman who developed myalgia and right chest pains on Saturday evening (July 27th).  Also has dull ache in right mouth and side of head.  Denies fever, dyspnea, leg swelling, nausea, vomiting, diarrhea, dysuria  She lives with son who is unemployed and he is annoying her which causes stress.  Objective:  NAD HEENT:  Normal Neck: supple, no adenopathy Chest:  Clear, nontender Heart:  Reg, no murmur Abdomen:  nontender Skin:  No rash [facility] reading (PRIMARY) by  Dr. Milus Glazier CXR.  EKG:

## 2012-02-07 ENCOUNTER — Encounter: Payer: Self-pay | Admitting: Family Medicine

## 2012-02-07 ENCOUNTER — Encounter (HOSPITAL_COMMUNITY): Payer: Self-pay | Admitting: Pharmacist

## 2012-02-07 ENCOUNTER — Encounter (HOSPITAL_COMMUNITY): Payer: Self-pay | Admitting: General Practice

## 2012-02-07 ENCOUNTER — Other Ambulatory Visit: Payer: Self-pay

## 2012-02-07 ENCOUNTER — Encounter (HOSPITAL_COMMUNITY)
Admission: EM | Disposition: A | Discharge status: Home / Self Care | Payer: Self-pay | Source: Home / Self Care | Attending: Internal Medicine

## 2012-02-07 DIAGNOSIS — I2510 Atherosclerotic heart disease of native coronary artery without angina pectoris: Secondary | ICD-10-CM

## 2012-02-07 DIAGNOSIS — I2109 ST elevation (STEMI) myocardial infarction involving other coronary artery of anterior wall: Secondary | ICD-10-CM

## 2012-02-07 HISTORY — PX: CARDIAC CATHETERIZATION: SHX172

## 2012-02-07 HISTORY — PX: CORONARY STENT PLACEMENT: SHX1402

## 2012-02-07 HISTORY — PX: LEFT HEART CATHETERIZATION WITH CORONARY ANGIOGRAM: SHX5451

## 2012-02-07 HISTORY — PX: PERCUTANEOUS CORONARY STENT INTERVENTION (PCI-S): SHX5485

## 2012-02-07 LAB — COMPREHENSIVE METABOLIC PANEL
ALT: 14 U/L (ref 0–35)
AST: 18 U/L (ref 0–37)
Albumin: 3.1 g/dL — ABNORMAL LOW (ref 3.5–5.2)
Calcium: 9.2 mg/dL (ref 8.4–10.5)
Creatinine, Ser: 0.62 mg/dL (ref 0.50–1.10)
Sodium: 141 mEq/L (ref 135–145)

## 2012-02-07 LAB — POCT I-STAT, CHEM 8
Calcium, Ion: 1.25 mmol/L (ref 1.13–1.30)
Chloride: 105 mEq/L (ref 96–112)
Creatinine, Ser: 0.8 mg/dL (ref 0.50–1.10)
Glucose, Bld: 75 mg/dL (ref 70–99)
HCT: 41 % (ref 36.0–46.0)
Hemoglobin: 13.9 g/dL (ref 12.0–15.0)
Potassium: 3.5 mEq/L (ref 3.5–5.1)

## 2012-02-07 LAB — CARDIAC PANEL(CRET KIN+CKTOT+MB+TROPI)
CK, MB: 2.6 ng/mL (ref 0.3–4.0)
CK, MB: 2.4 ng/mL (ref 0.3–4.0)
Relative Index: 2.2 (ref 0.0–2.5)
Relative Index: 2.3 (ref 0.0–2.5)
Relative Index: 2.3 (ref 0.0–2.5)
Total CK: 118 U/L (ref 7–177)
Total CK: 103 U/L (ref 7–177)
Total CK: 100 U/L (ref 7–177)

## 2012-02-07 LAB — APTT: aPTT: 43 seconds — ABNORMAL HIGH (ref 24–37)

## 2012-02-07 LAB — TSH: TSH: 2.085 u[IU]/mL (ref 0.350–4.500)

## 2012-02-07 LAB — PROTIME-INR: Prothrombin Time: 14.6 seconds (ref 11.6–15.2)

## 2012-02-07 SURGERY — LEFT HEART CATHETERIZATION WITH CORONARY ANGIOGRAM
Anesthesia: LOCAL

## 2012-02-07 MED ORDER — CLOPIDOGREL BISULFATE 300 MG PO TABS
ORAL_TABLET | ORAL | Status: AC
  Filled 2012-02-07: qty 2

## 2012-02-07 MED ORDER — SODIUM CHLORIDE 0.9 % IJ SOLN
3.0000 mL | Freq: Two times a day (BID) | INTRAMUSCULAR | Status: DC
  Administered 2012-02-07: 3 mL via INTRAVENOUS

## 2012-02-07 MED ORDER — SODIUM CHLORIDE 0.9 % IV SOLN: 250.0000 mL | INTRAVENOUS | Status: DC | PRN

## 2012-02-07 MED ORDER — NITROGLYCERIN 0.4 MG SL SUBL: 0.4000 mg | SUBLINGUAL_TABLET | SUBLINGUAL | Status: DC | PRN

## 2012-02-07 MED ORDER — SODIUM CHLORIDE 0.9 % IV SOLN
0.2500 mg/kg/h | INTRAVENOUS | Status: AC
  Filled 2012-02-07: qty 250

## 2012-02-07 MED ORDER — DIAZEPAM 5 MG PO TABS
5.0000 mg | ORAL_TABLET | ORAL | Status: AC
  Administered 2012-02-07: 5 mg via ORAL
  Filled 2012-02-07: qty 1

## 2012-02-07 MED ORDER — LIDOCAINE HCL (PF) 1 % IJ SOLN
INTRAMUSCULAR | Status: AC
  Filled 2012-02-07: qty 30

## 2012-02-07 MED ORDER — SODIUM CHLORIDE 0.9 % IJ SOLN: 3.0000 mL | INTRAMUSCULAR | Status: DC | PRN

## 2012-02-07 MED ORDER — ALPRAZOLAM 0.25 MG PO TABS: 0.2500 mg | ORAL_TABLET | Freq: Two times a day (BID) | ORAL | Status: DC | PRN

## 2012-02-07 MED ORDER — ACETAMINOPHEN 325 MG PO TABS: 650.0000 mg | ORAL_TABLET | ORAL | Status: DC | PRN

## 2012-02-07 MED ORDER — ZOLPIDEM TARTRATE 5 MG PO TABS: 5.0000 mg | ORAL_TABLET | Freq: Every evening | ORAL | Status: DC | PRN

## 2012-02-07 MED ORDER — SODIUM CHLORIDE 0.9 % IV SOLN
1.0000 mL/kg/h | INTRAVENOUS | Status: AC
  Administered 2012-02-07: 1 mL/kg/h via INTRAVENOUS

## 2012-02-07 MED ORDER — FENTANYL CITRATE 0.05 MG/ML IJ SOLN
INTRAMUSCULAR | Status: AC
  Filled 2012-02-07: qty 2

## 2012-02-07 MED ORDER — ASPIRIN 81 MG PO CHEW
324.0000 mg | CHEWABLE_TABLET | ORAL | Status: AC
  Administered 2012-02-07: 324 mg via ORAL
  Filled 2012-02-07: qty 4

## 2012-02-07 MED ORDER — SODIUM CHLORIDE 0.9 % IV SOLN
INTRAVENOUS | Status: DC
  Administered 2012-02-07: 01:00:00 via INTRAVENOUS

## 2012-02-07 MED ORDER — MORPHINE SULFATE 2 MG/ML IJ SOLN: 1.0000 mg | INTRAMUSCULAR | Status: DC | PRN

## 2012-02-07 MED ORDER — ONDANSETRON HCL 4 MG/2ML IJ SOLN: 4.0000 mg | Freq: Four times a day (QID) | INTRAMUSCULAR | Status: DC | PRN

## 2012-02-07 MED ORDER — CARVEDILOL 6.25 MG PO TABS
6.2500 mg | ORAL_TABLET | Freq: Two times a day (BID) | ORAL | Status: DC
  Administered 2012-02-07 – 2012-02-08 (×3): 6.25 mg via ORAL
  Filled 2012-02-07 (×4): qty 1

## 2012-02-07 MED ORDER — BIVALIRUDIN 250 MG IV SOLR
INTRAVENOUS | Status: AC
  Filled 2012-02-07: qty 250

## 2012-02-07 MED ORDER — NITROGLYCERIN 0.2 MG/ML ON CALL CATH LAB
INTRAVENOUS | Status: AC
  Filled 2012-02-07: qty 1

## 2012-02-07 MED ORDER — FAMOTIDINE IN NACL 20-0.9 MG/50ML-% IV SOLN
INTRAVENOUS | Status: AC
  Filled 2012-02-07: qty 50

## 2012-02-07 MED ORDER — HEPARIN (PORCINE) IN NACL 2-0.9 UNIT/ML-% IJ SOLN
INTRAMUSCULAR | Status: AC
  Filled 2012-02-07: qty 2000

## 2012-02-07 MED ORDER — SODIUM CHLORIDE 0.9 % IJ SOLN: 3.0000 mL | Freq: Two times a day (BID) | INTRAMUSCULAR | Status: DC

## 2012-02-07 NOTE — CV Procedure (Signed)
Cardiac Catheterization Procedure Note  Name: [name] MRN: [id number] DOB: Jan 23, 1934  Procedure: Left Heart Cath, Selective Coronary Angiography, LV angiography,  PTCA/Stent of proximal LAD  Indication: 78 year old black female with history of hypertension, hyperlipidemia, and previous stroke presents with unstable angina. She has ischemic ST-T wave changes anteriorly.   Diagnostic Procedure Details: The right groin was prepped, draped, and anesthetized with 1% lidocaine. Using the modified Seldinger technique, a 5 French sheath was introduced into the right femoral artery. Standard Judkins catheters were used for selective coronary angiography and left ventriculography. Catheter exchanges were performed over a wire.  The diagnostic procedure was well-tolerated without immediate complications.  PROCEDURAL FINDINGS Hemodynamics: AO 157/102 with a mean of 127 mmHg LV 152/20 mmHg  Coronary angiography: Coronary dominance: right  Left mainstem: The left main coronary is large with mild ectasia distally.  Left anterior descending (LAD): The left anterior descending artery is a very large vessel. In the proximal vessel there is a 99% stenosis. In the mid vessel there is a segmental 50% stenosis. There are mild irregularities in the distal vessel.  There is a large ramus intermediate branch which has mild irregularities up to 20%.  Left circumflex (LCx): The left circumflex coronary consists of 2 marginal branches. The second marginal branch has a 40-50% focal stenosis in the mid vessel.  Right coronary artery (RCA): The right coronary is a dominant vessel. There is 30% narrowing in the proximal vessel. In the mid vessel there is a segment of 50-70% stenosis. The distal vessel has diffuse irregularities up to 30%.  Left ventriculography: Left ventricular systolic function is abnormal. There is distal anterior, apical, and distal inferior wall severe hypokinesis to akinesis. Overall  systolic function is moderately reduced with ejection fraction estimated at 40%.  PCI Procedure Note:  Following the diagnostic procedure, the patient developed acute chest pain with ST segment elevation in the anterior leads and increased ventricular ectopy. The decision was made to proceed with PCI. The sheath was upsized to a 6 [place]. Weight-based bivalirudin was given for anticoagulation. Plavix 600 mg was given orally. Once a therapeutic ACT was achieved, a 6 [place] XB LAD 3.5 guide catheter was inserted.  A pro-water coronary guidewire was used to cross the lesion.  The lesion was predilated with a 2.5 mm compliant balloon. This resulted in resolution of her chest pain. The lesion was then stented with a 4.5 x 16 mm Veriflex stent.  The stent was postdilated with a 4.5 mm noncompliant balloon to 16 atmospheres.  Following PCI, there was 0% residual stenosis and TIMI-3 flow. Final angiography confirmed an excellent result. Femoral hemostasis was achieved with manual compression.  The patient tolerated the PCI procedure well. There were no immediate procedural complications.  The patient was transferred to the post catheterization recovery area for further monitoring.  PCI Data: Vessel - LAD/Segment - proximal Percent Stenosis (pre)  99% TIMI-flow 2 Stent 4.5 x 16 mm Veriflex Percent Stenosis (post) 0% TIMI-flow (post) 3   Final Conclusions:   1. Single vessel obstructive atherosclerotic coronary disease. Patient had a critical 99% stenosis in the proximal LAD with clinical instability. There is moderate disease in the mid LAD and in the mid RCA. 2. Moderate left ventricular dysfunction with wall motion abnormalities noted. 3. Successful intracoronary stenting of the proximal LAD with a bare-metal stent.  Recommendations: Continue aspirin and Plavix for at least one month. Would prefer to continue dual antiplatelet therapy for one year if tolerated.  Theron Arista [facility] 02/07/2012,  9:42  AM

## 2012-02-07 NOTE — Interval H&P Note (Signed)
History and Physical Interval Note:  02/07/2012 8:37 AM  [name]  has presented today for surgery, with the diagnosis of chest pain  The various methods of treatment have been discussed with the patient and family. After consideration of risks, benefits and other options for treatment, the patient has consented to  Procedure(s) (LRB): LEFT HEART CATHETERIZATION WITH CORONARY ANGIOGRAM (N/A) as a surgical intervention .  The patient's history has been reviewed, patient examined, no change in status, stable for surgery.  I have reviewed the patient's chart and labs.  Questions were answered to the patient's satisfaction.     Theron Arista [facility] 02/07/2012 8:38 AM

## 2012-02-07 NOTE — Progress Notes (Signed)
Angiomax drip discontinued @ 10:22:00 as per orders.

## 2012-02-07 NOTE — Progress Notes (Signed)
Site area: right groin  Site Prior to Removal:  Level 0  Pressure Applied For 20 MINUTES    Minutes Beginning at 1255 Manual:   yes  Patient Status During Pull:  stable  Post Pull Groin Site:  Level 0  Post Pull Instructions Given:  yes  Post Pull Pulses Present:  yes  Dressing Applied:  yes  Comments:  Gauze secured with medipore tape dry and intact

## 2012-02-08 ENCOUNTER — Encounter (HOSPITAL_COMMUNITY): Payer: Self-pay | Admitting: Physician Assistant

## 2012-02-08 ENCOUNTER — Other Ambulatory Visit: Payer: Self-pay

## 2012-02-08 DIAGNOSIS — Z8679 Personal history of other diseases of the circulatory system: Secondary | ICD-10-CM

## 2012-02-08 DIAGNOSIS — I200 Unstable angina: Secondary | ICD-10-CM | POA: Diagnosis present

## 2012-02-08 DIAGNOSIS — Z8673 Personal history of transient ischemic attack (TIA), and cerebral infarction without residual deficits: Secondary | ICD-10-CM

## 2012-02-08 DIAGNOSIS — I369 Nonrheumatic tricuspid valve disorder, unspecified: Secondary | ICD-10-CM

## 2012-02-08 LAB — BASIC METABOLIC PANEL
CO2: 26 mEq/L (ref 19–32)
Calcium: 9.3 mg/dL (ref 8.4–10.5)
Creatinine, Ser: 0.81 mg/dL (ref 0.50–1.10)
GFR calc non Af Amer: 69 mL/min — ABNORMAL LOW (ref >90)
Glucose, Bld: 100 mg/dL — ABNORMAL HIGH (ref 70–99)
Sodium: 143 mEq/L (ref 135–145)

## 2012-02-08 LAB — CBC
Hemoglobin: 13.4 g/dL (ref 12.0–15.0)
MCH: 31.2 pg (ref 26.0–34.0)
MCHC: 34.2 g/dL (ref 30.0–36.0)
MCV: 91.4 fL (ref 78.0–100.0)
RBC: 4.29 MIL/uL (ref 3.87–5.11)

## 2012-02-08 MED ORDER — NITROGLYCERIN 0.4 MG SL SUBL: 0.4000 mg | SUBLINGUAL_TABLET | SUBLINGUAL | Status: DC | PRN

## 2012-02-08 MED ORDER — CLOPIDOGREL BISULFATE 75 MG PO TABS: 75.0000 mg | ORAL_TABLET | Freq: Every day | ORAL | Status: DC

## 2012-02-08 MED ORDER — CARVEDILOL 6.25 MG PO TABS: 6.2500 mg | ORAL_TABLET | Freq: Two times a day (BID) | ORAL | Status: DC

## 2012-02-08 MED ORDER — LISINOPRIL 10 MG PO TABS
10.0000 mg | ORAL_TABLET | Freq: Every day | ORAL | Status: DC
  Administered 2012-02-08: 10 mg via ORAL
  Filled 2012-02-08 (×2): qty 1

## 2012-02-08 MED ORDER — LISINOPRIL 10 MG PO TABS: 10.0000 mg | ORAL_TABLET | Freq: Every day | ORAL | Status: DC

## 2012-02-08 MED FILL — Dextrose Inj 5%: INTRAVENOUS | Qty: 50 | Status: AC

## 2012-02-08 NOTE — Discharge Summary (Signed)
Discharge Summary   Patient ID: JAVEAH LOEZA MRN: [id number], DOB/AGE: [birth year] 78 y.o. Admit date: 02/06/2012 D/C date:     02/08/2012  Primary Cardiologist: Dr. Swaziland  Primary Discharge Diagnoses:  1. Unstable angina with newly diagnosed CAD this admission by cath 02/07/12 - s/p BMS to prox LAD, residual moderate mid-LAD & mid-RCA dz for medical therapy 2. LV dysfunction with EF 40% by cath, then 50% by echo  Secondary Discharge Diagnoses:  1. HTN 2. H/o subdural hematoma s/p craniotomy 2004  3. R brain CVA 04/2011 4. Prior hx of seizures 5. Diverticulosis 6. H/o pyelonephritis 2006 7. H/o abnormal CT of abdomen, nodes resolved per chart 8. Osteoporosis 9. Right inguinal hernia (seen on CT 2008)  Hospital Course: Ms. Devita is a 78 y/o F with hx of HTN, HL, CVA 04/2011, SDH 2004 who initially presented to [facility] on 02/06/12 with complaints of CP, dull pain on R mouth and side of her head. She was in her usual state of health until 02/04/12 when she was doing laundry and developed aching epigastric to mid chest discomfort associated with "hot flash" sensation. She used a cold compress and it went away; she was able to go to sleep as usual. On the evening of 7/28, she had a disagreement with her son and symptoms returned, again relieved with a cold compress. On the morning of admission, she was feeling rushed while running late for a meeting and felt symptoms come on again, thus she presented to [facility]gent [facility]. She was given 324mg  of ASA. Due to EKG changes, she was subsequently referred to the ED. EKG demonstrated NSR 76 bpm with slight ST elevation V1, V2 with marked T wave inversion V1-V4, T wave inversion I, AVL. T wave changes were present on EKG 04/15/11 but more prominent this admission. She was admitted to [facility] for evaluation.  Initially there was plan to hold on anticoagulation but she was eventually started on IV heparin for concern for ACS. Cardiac  enzymes remained negative. She underwent cardiac cath 02/07/12 demonstrating critical 99% proximal LAD stenosis with clinical instability - this was subsequently stented with a bare-metal stent. She had residual moderate dz in mid LAD and mid RCA. She was started on ASA/Plavix with recommendation to continue dual antiplatelet therapy for at least 1 month, and preferably for one year if tolerated -- note that she was on Plavix prior to admission with no hx of recent bleeding problems. LV dysfunction was present on cath with distal anterior, apical, and distal inferior wall severe hypokinesis to akinesis and EF of 40%. It was unclear if this LV dysfunction was due to scar or stunning. F/u 2d echo actually demonstrated EF 50%.  Dr. Swaziland reviewed this study.The patient tolerated cath well without complications. Medication titration was initiated including increase of Lisinopril and addition of Coreg. The patient plans to stay with her daughter in [place] for the next 2 weeks while she recuperates. She ambulated well with cardiac rehab. Dr. Swaziland has seen and examined the patient today and feels she is stable for discharge. He recommends she be out of work for 2 weeks until seen back in the office.  Discharge Vitals: Blood pressure 121/79, pulse 83, temperature 97.7 F (36.5 C), temperature source Oral, resp. rate 22, height 4\' 11"  (1.499 m), weight 113 lb 5.1 oz (51.4 kg), SpO2 98.00%.  Labs: Lab Results  Component Value Date   WBC 5.5 02/08/2012   HGB 13.4 02/08/2012  HCT 39.2 02/08/2012   MCV 91.4 02/08/2012   PLT 162 02/08/2012    Lab 02/08/12 0500 02/07/12 0550  NA 143 --  K 3.8 --  CL 109 --  CO2 26 --  BUN 14 --  CREATININE 0.81 --  CALCIUM 9.3 --  PROT -- 6.0  BILITOT -- 0.6  ALKPHOS -- 67  ALT -- 14  AST -- 18  GLUCOSE 100* --    Basename 02/07/12 1147 02/07/12 0550 02/07/12 0025 02/06/12 1441  CKTOTAL 100 103 118 144  CKMB 2.3 2.4 2.6 3.2  TROPONINI <0.30 <0.30 <0.30 --   Lab  Results  Component Value Date   CHOL 218* 04/13/2011   HDL 65 04/13/2011   LDLCALC 140* 04/13/2011   TRIG 67 04/13/2011    Diagnostic Studies/Procedures   1. Cardiac catheterization this admission, please see full report and above for summary.  2. Dg Chest Port 1 View 02/06/2012  *RADIOLOGY REPORT*  Clinical Data: Chest pain  PORTABLE CHEST - 1 VIEW  Comparison: 09/30/2011; 04/12/2011; 11/24/2004  Findings:  Grossly unchanged cardiac silhouette and mediastinal contours with mild ectasia of the thoracic aorta.  The lungs again appear hyperinflated.  There is mild diffuse thickening of the pulmonary interstitium.  No focal airspace opacity.  No definite pleural effusion or pneumothorax.  Grossly unchanged bones.  IMPRESSION: Hyperexpanded lungs with mild thickening of the pulmonary interstitium, nonspecific but may be seen in the setting of airways disease, though atypical infection may have a similar appearance.  Original Report Authenticated By: Waynard Reeds, M.D.   3. 2D echo 02/08/12 Study Conclusions - Left ventricle: Abnormal apical and septal motion with prominant trabeculations at apex The cavity size was mildly dilated. Wall thickness was normal. The estimated ejection fraction was 50%. - Right atrium: The atrium was mildly dilated. - Atrial septum: No defect or patent foramen ovale was identified.  Discharge Medications   Note below it says stop taking ASA  But also lists it to take it. The "STOP" aspirin refers to the dose entered by pharmacy from urgent care. She will remain on 81mg  daily.  Medication List  As of 02/08/2012  5:12 PM   STOP taking these medications         aspirin 81 MG chewable tablet         TAKE these medications         alum & mag hydroxide-simeth 200-200-20 MG/5ML suspension   Commonly known as: MAALOX/MYLANTA   Take 15 mLs by mouth every 6 (six) hours as needed. As needed for gas.      aspirin 81 MG tablet   Take 81 mg by mouth daily.       atorvastatin 80 MG tablet   Commonly known as: LIPITOR   Take 80 mg by mouth every evening.      CALCIUM PO   Take by mouth.      carvedilol 6.25 MG tablet   Commonly known as: COREG   Take 1 tablet (6.25 mg total) by mouth 2 (two) times daily with a meal.      clopidogrel 75 MG tablet   Commonly known as: PLAVIX   Take 1 tablet (75 mg total) by mouth daily.      fish oil-omega-3 fatty acids 1000 MG capsule   Take 2 g by mouth 2 (two) times daily.      hydrochlorothiazide 12.5 MG capsule   Commonly known as: MICROZIDE   take 1 capsule by mouth once daily  lisinopril 10 MG tablet   Commonly known as: PRINIVIL,ZESTRIL   Take 1 tablet (10 mg total) by mouth daily.      loperamide 2 MG tablet   Commonly known as: IMODIUM A-D   Take 2 mg by mouth 4 (four) times daily as needed. As needed for loose stools.      nitroGLYCERIN 0.4 MG SL tablet   Commonly known as: NITROSTAT   Place 1 tablet (0.4 mg total) under the tongue every 5 (five) minutes x 3 doses as needed for chest pain.      psyllium 58.6 % powder   Commonly known as: METAMUCIL   Take 1 packet by mouth daily.            Disposition   The patient will be discharged in stable condition to home. Discharge Orders    Future Appointments: Provider: Department: Dept Phone: Center:   02/22/2012 10:10 AM Beatrice Lecher, PA [facility] [phone] [facility]     Future Orders Please Complete By Expires   Amb Referral to Cardiac Rehabilitation      Diet - low sodium heart healthy      Increase activity slowly      Comments:   No driving for 2 days. No lifting over 5 lbs for 1 week. No sexual activity for 1 week. You may not return to work until cleared by your cardiologist. Keep procedure site clean & dry. If you notice increased pain, swelling, bleeding or pus, call/return!  You may shower, but no soaking baths/hot tubs/pools for 1 week.     Follow-up Information    Follow up with Tereso Newcomer, PA.  (Your first follow-up with be with Tereso Newcomer, PA-C at [facility] on 02/22/12 at 10:10am)    Contact information:   [address] [place] [address] [phone]       Follow up with Lucilla Edin, MD. (As scheduled for routine medical care.)    Contact information:   [address] [place] [address] [phone]            Duration of Discharge Encounter: Greater than 30 minutes including physician and PA time.  Signed, Ronie Spies PA-C 02/08/2012, 5:12 PM

## 2012-02-08 NOTE — Progress Notes (Signed)
TELEMETRY: Reviewed telemetry pt in NSR: Filed Vitals:   02/07/12 1959 02/08/12 0026 02/08/12 0434 02/08/12 0737  BP: 105/79 146/85 150/93 141/94  Pulse: 73 67  71  Temp:  97.9 F (36.6 C) 97.9 F (36.6 C) 97.8 F (36.6 C)  TempSrc:  Oral Oral Oral  Resp: 17 15 18 14   Height:      Weight:  51.4 kg (113 lb 5.1 oz)    SpO2: 98% 98% 99% 99%    Intake/Output Summary (Last 24 hours) at 02/08/12 0841 Last data filed at 02/07/12 2200  Gross per 24 hour  Intake    240 ml  Output    800 ml  Net   -560 ml    SUBJECTIVE Patient feels very well without chest pain or SOB. No groin complications.  LABS: Basic Metabolic Panel:  Basename 02/08/12 0500 02/07/12 0550  NA 143 141  K 3.8 3.6  CL 109 107  CO2 26 25  GLUCOSE 100* 83  BUN 14 13  CREATININE 0.81 0.62  CALCIUM 9.3 9.2  MG -- --  PHOS -- --   Liver Function Tests:  [facility] 02/07/12 0550  AST 18  ALT 14  ALKPHOS 67  BILITOT 0.6  PROT 6.0  ALBUMIN 3.1*   No results found for this basename: LIPASE:2,AMYLASE:2 in the last 72 hours CBC:  Basename 02/08/12 0500 02/06/12 2025 02/06/12 1441  WBC 5.5 -- 5.8  NEUTROABS -- -- --  HGB 13.4 13.9 --  HCT 39.2 41.0 --  MCV 91.4 -- 91.8  PLT 162 -- 180   Cardiac Enzymes:  Basename 02/07/12 1147 02/07/12 0550 02/07/12 0025  CKTOTAL 100 103 118  CKMB 2.3 2.4 2.6  CKMBINDEX -- -- --  TROPONINI <0.30 <0.30 <0.30   Thyroid Function Tests:  Basename 02/07/12 0025  TSH 2.085  T4TOTAL --  T3FREE --  THYROIDAB --    Radiology/Studies:  Dg Chest Port 1 View  02/06/2012  *RADIOLOGY REPORT*  Clinical Data: Chest pain  PORTABLE CHEST - 1 VIEW  Comparison: 09/30/2011; 04/12/2011; 11/24/2004  Findings:  Grossly unchanged cardiac silhouette and mediastinal contours with mild ectasia of the thoracic aorta.  The lungs again appear hyperinflated.  There is mild diffuse thickening of the pulmonary interstitium.  No focal airspace opacity.  No definite pleural effusion or  pneumothorax.  Grossly unchanged bones.  IMPRESSION: Hyperexpanded lungs with mild thickening of the pulmonary interstitium, nonspecific but may be seen in the setting of airways disease, though atypical infection may have a similar appearance.  Original Report Authenticated By: Waynard Reeds, M.D.     PHYSICAL EXAM General: Well developed, thin, in no acute distress. Head: Normocephalic, atraumatic, sclera non-icteric, no xanthomas, nares are without discharge. Neck: Negative for carotid bruits. JVD not elevated. Lungs: Clear bilaterally to auscultation without wheezes, rales, or rhonchi. Breathing is unlabored. Heart: RRR S1 S2 without murmurs, rubs, or gallops.  Abdomen: Soft, non-tender, non-distended with normoactive bowel sounds. No hepatomegaly. No rebound/guarding. No obvious abdominal masses. Msk:  Strength and tone appears normal for age. Extremities: No clubbing, cyanosis or edema.  Distal pedal pulses are 2+ and equal bilaterally. No right groin hematoma. Neuro: Alert and oriented X 3. Moves all extremities spontaneously. Psych:  Responds to questions appropriately with a normal affect.  ASSESSMENT AND PLAN: 1. Unstable angina. S/p BMS of proximal LAD. Needs to continue ASA 81 mg daily and Plavix 75 mg daily for at least one month. Given history of spontaneous subdural hematoma may  not want to continue Plavix long term. She has moderate disease in the mid LAD and RCA that will be treated medically.  2. LV dysfunction. Enzymes negative on admission but LV gram on cath indicates significant dysfunction of the anterior wall and apex. Unclear whether this is scar or stunning. Will check Echo today. Will probably need to reassess in 3 months. Titrate carvedilol and lisinopril as tolerated.   3. HTN- carvedilol added yesterday. Will increase lisinopril to 10 mg daily. May need further outpatient adjustment.  4. History of CVA  5. History of subdural hematoma 10/12  6.  Hyperlipidemia. On high dose lipitor and fish oil.   Plan: will discharge home later today after Echo done. Patient plans to stay with daughter in [place] the next 2 weeks while she recuperates. I encouraged phase 2 cardiac Rehab. Will need to be out of work for 2 weeks until we see back in the office.  Principal Problem:  *Unstable angina Active Problems:  Hypertension  Seizures  History of CVA (cerebrovascular accident)  Personal history of subdural hematoma    Signed, Anand Tejada [place] MD,FACC 02/08/2012 8:41 AM

## 2012-02-08 NOTE — Progress Notes (Signed)
CARDIAC REHAB PHASE I   PRE:  Rate/Rhythm: 71 SR    BP: sitting 141/94    SaO2:   MODE:  Ambulation: 500 ft   POST:  Rate/Rhythm: 86 SR    BP: sitting 157/95     SaO2:   Tolerated fine, BP up. No c/o. Ed completed with pt and daughter. Wants to start ex and interested in [facility] in [place], will send referral. Gave MI book.  [phone]  Harriet Masson CES, ACSM

## 2012-02-08 NOTE — Progress Notes (Signed)
  Echocardiogram 2D Echocardiogram has been performed.  Margreta Journey 02/08/2012, 2:53 PM

## 2012-02-09 MED ORDER — EPINEPHRINE HCL 0.1 MG/ML IJ SOLN
INTRAMUSCULAR | Status: AC
  Filled 2012-02-09: qty 10

## 2012-02-09 NOTE — Discharge Summary (Signed)
Patient seen and examined and history reviewed. Agree with above findings and plan. See rounding note from earlier today.   Theron Arista [facility]  02/09/2012 8:38 AM

## 2012-02-22 ENCOUNTER — Encounter: Payer: Self-pay | Admitting: Physician Assistant

## 2012-02-22 ENCOUNTER — Encounter: Payer: Self-pay | Admitting: *Deleted

## 2012-02-22 ENCOUNTER — Ambulatory Visit (INDEPENDENT_AMBULATORY_CARE_PROVIDER_SITE_OTHER): Payer: Medicare Other | Admitting: Physician Assistant

## 2012-02-22 ENCOUNTER — Telehealth: Payer: Self-pay | Admitting: Physician Assistant

## 2012-02-22 VITALS — BP 121/78 | HR 54 | Ht 59.0 in | Wt 110.8 lb

## 2012-02-22 DIAGNOSIS — I2589 Other forms of chronic ischemic heart disease: Secondary | ICD-10-CM

## 2012-02-22 DIAGNOSIS — I2510 Atherosclerotic heart disease of native coronary artery without angina pectoris: Secondary | ICD-10-CM

## 2012-02-22 DIAGNOSIS — I255 Ischemic cardiomyopathy: Secondary | ICD-10-CM

## 2012-02-22 DIAGNOSIS — Z8679 Personal history of other diseases of the circulatory system: Secondary | ICD-10-CM

## 2012-02-22 DIAGNOSIS — E785 Hyperlipidemia, unspecified: Secondary | ICD-10-CM

## 2012-02-22 DIAGNOSIS — I10 Essential (primary) hypertension: Secondary | ICD-10-CM

## 2012-02-22 LAB — BASIC METABOLIC PANEL
BUN: 12 mg/dL (ref 6–23)
Chloride: 101 mEq/L (ref 96–112)
Creatinine, Ser: 0.8 mg/dL (ref 0.4–1.2)
GFR: 95.04 mL/min (ref >60.00)

## 2012-02-22 MED ORDER — NITROGLYCERIN 0.4 MG SL SUBL: 0.4000 mg | SUBLINGUAL_TABLET | SUBLINGUAL | Status: DC | PRN

## 2012-02-22 NOTE — Progress Notes (Signed)
[address] [place], [place]  [address] Phone: [phone] Fax:  [phone]  Date:  02/22/2012   Name:  [name]   DOB:  [birth year]   MRN:  [id number]  PCP:  Lucilla Edin, MD  Primary Cardiologist:  Dr. Peter Swaziland  Primary Electrophysiologist:  None    History of Present Illness: [name] is a 78 y.o. female who returns for post hospital followup.  She has a history of HTN, HL, prior stroke, prior subdural hematoma with craniotomy in 2004, seizure disorder and osteoporosis. She was admitted 7/29-7/31 with symptoms consistent with unstable angina. MI ruled out.   LHC 02/06/12: pLAD 99%, mLAD 50%, RI 20%, OM2 40-50%, pRCA 30%, mRCA 50-70%, dRCA 30%, EF 40% with distal anterior, apical and distal inferior HK to AK.   PCI: Veriflex BMS to the proximal LAD.   Echocardiogram 02/08/12: Abnormal apical and septal motion with prominent trabeculations the apex, normal wall thickness, EF 50%, mild RAE.  The patient denies chest pain, shortness of breath, syncope, orthopnea, PND or significant pedal edema.    Past Medical History  Diagnosis Date  . Hypertension   . Seizures   . CVA (cerebral vascular accident)     Right brain CVA 04/2011 - carotid dopplers showed no significant stenosis (tortuous ICA).   . Diverticulosis   . Subdural hematoma     2004 s/p craniotomy  . Pyelonephritis 2006  . Abnormal CT of the abdomen     Nodes  resolved  . Inguinal hernia     Right, seen on CT 2008  . Diverticulosis   . Osteoporosis   . Coronary artery disease     a. [place] s/p BMS to prox LAD (with residual moderate mid-LAD & mid-RCA dz for medical therapy) 01/2012 - EF 40% at that time by cath.  . Arthritis   . LV dysfunction     EF 40% by cath 01/2012 with WMA, but 50% by f/u echo same admission    Current Outpatient Prescriptions  Medication Sig Dispense Refill  . alum & mag hydroxide-simeth (MAALOX/MYLANTA) 200-200-20 MG/5ML suspension Take 15 mLs  by mouth every 6 (six) hours as needed. As needed for gas.      Marland Kitchen aspirin 81 MG tablet Take 81 mg by mouth daily.      Marland Kitchen atorvastatin (LIPITOR) 80 MG tablet Take 80 mg by mouth every evening.      Marland Kitchen CALCIUM PO Take by mouth.      . carvedilol (COREG) 6.25 MG tablet Take 1 tablet (6.25 mg total) by mouth 2 (two) times daily with a meal.  60 tablet  6  . clopidogrel (PLAVIX) 75 MG tablet Take 1 tablet (75 mg total) by mouth daily.  30 tablet  6  . fish oil-omega-3 fatty acids 1000 MG capsule Take 2 g by mouth 2 (two) times daily.       . hydrochlorothiazide (MICROZIDE) 12.5 MG capsule take 1 capsule by mouth once daily  30 capsule  1  . lisinopril (PRINIVIL,ZESTRIL) 10 MG tablet Take 1 tablet (10 mg total) by mouth daily.  30 tablet  6  . loperamide (IMODIUM A-D) 2 MG tablet Take 2 mg by mouth 4 (four) times daily as needed. As needed for loose stools.      . nitroGLYCERIN (NITROSTAT) 0.4 MG SL tablet Place 1 tablet (0.4 mg total) under the tongue every 5 (five) minutes x 3 doses as needed for chest pain.  25  tablet  4  . psyllium (METAMUCIL) 58.6 % powder Take 1 packet by mouth daily.         Allergies: Allergies  Allergen Reactions  . Latex Itching  . Flagyl (Metronidazole) Rash    History  Substance Use Topics  . Smoking status: Former Smoker    Types: Cigarettes    Quit date: 04/10/2003  . Smokeless tobacco: Never Used   Comment: quit 2004  . Alcohol Use: Yes     occasional     PHYSICAL EXAM: VS:  BP 121/78  Pulse 54  Ht 4\' 11"  (1.499 m)  Wt 110 lb 12.8 oz (50.259 kg)  BMI 22.38 kg/m2 Well nourished, well developed, in no acute distress HEENT: normal Neck: no JVD Cardiac:  normal S1, S2; RRR; no murmur; no rub  Lungs:  clear to auscultation bilaterally, no wheezing, rhonchi or rales Abd: soft, nontender, no hepatomegaly Ext: no edema; right groin without hematoma or bruit  Skin: warm and dry Neuro:  CNs 2-12 intact, no focal abnormalities noted  EKG:  Sinus rhythm,  heart rate 52, normal axis, low voltage, nonspecific ST-T wave changes      ASSESSMENT AND PLAN:  1. Coronary Artery Disease Doing well post PCI to the LAD. We discussed the importance of dual antiplatelet therapy. She has been contacted by cardiac rehabilitation. I have encouraged her to pursue this. I have given her a note to return to work on August 19. She can followup with Dr. Swaziland in 2-3 months. I completed her FMLA paperwork today.  2. Hypertension Lisinopril adjusted this admission. Check a basic metabolic panel today.  3. Hyperlipidemia Managed by primary care. Goal LDL less than 70.  4. History of Subdural Hematoma She tells me that this occurred in the setting of hitting her head on a hard object. I suspect she should be able to complete one year of dual antiplatelet therapy.  5. Ischemic Cardiomyopathy EF was down the time of her catheterization. Echocardiogram in followup demonstrated an ejection fraction of 50%. Continue ACE inhibitor and beta blocker therapy.  SignedTereso Newcomer, PA-C  10:24 AM 02/22/2012

## 2012-02-22 NOTE — Patient Instructions (Addendum)
Your physician recommends that you return for lab work in: TODAY BMET  MAKE SURE TO FOLLOW UP WITH CARDIAC REHAB IF YOU CAN  NO CHANGES WERE MADE TODAY  PLEASE FOLLOW UP WITH DR. Swaziland 04/13/12 @ 11:15 AM

## 2012-02-23 ENCOUNTER — Telehealth: Payer: Self-pay | Admitting: *Deleted

## 2012-02-23 NOTE — Telephone Encounter (Signed)
Message copied by Tarri Fuller on Thu Feb 23, 2012 11:25 AM ------      Message from: [place], [place] T      Created: Wed Feb 22, 2012  5:42 PM       Please notify patient that the lab results are ok.      Tereso Newcomer, PA-C  5:42 PM 02/22/2012

## 2012-02-23 NOTE — Telephone Encounter (Signed)
Message copied by Tarri Fuller on Thu Feb 23, 2012 10:32 AM ------      Message from: [place], [place] T      Created: Wed Feb 22, 2012  5:42 PM       Please notify patient that the lab results are ok.      Tereso Newcomer, PA-C  5:42 PM 02/22/2012

## 2012-02-23 NOTE — Telephone Encounter (Signed)
lmom labs ok 

## 2012-04-08 ENCOUNTER — Other Ambulatory Visit: Payer: Self-pay | Admitting: Physician Assistant

## 2012-04-13 ENCOUNTER — Encounter: Payer: Self-pay | Admitting: Cardiology

## 2012-04-13 ENCOUNTER — Ambulatory Visit (INDEPENDENT_AMBULATORY_CARE_PROVIDER_SITE_OTHER): Payer: Medicare Other | Admitting: Cardiology

## 2012-04-13 VITALS — BP 138/90 | HR 72 | Ht 59.0 in | Wt 114.8 lb

## 2012-04-13 DIAGNOSIS — I10 Essential (primary) hypertension: Secondary | ICD-10-CM

## 2012-04-13 DIAGNOSIS — E785 Hyperlipidemia, unspecified: Secondary | ICD-10-CM | POA: Insufficient documentation

## 2012-04-13 DIAGNOSIS — I25810 Atherosclerosis of coronary artery bypass graft(s) without angina pectoris: Secondary | ICD-10-CM | POA: Insufficient documentation

## 2012-04-13 NOTE — Patient Instructions (Signed)
Continue your current therapy  I will see you again in 6 months.   

## 2012-04-13 NOTE — Progress Notes (Signed)
[name] Date of Birth: [birth year] Medical Record #[id number]  History of Present Illness: [name] is seen for followup of coronary disease. She presented in late July with symptoms of unstable angina. She underwent stenting of the proximal LAD with a bare-metal stent. Ejection fraction was 50% by echocardiogram. She continues to do very well and denies any recurrent chest pain, shortness of breath, dizziness, or palpitations. 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 She has had no bleeding difficulty.  Current Outpatient Prescriptions on File Prior to Visit  Medication Sig Dispense Refill  . alum & mag hydroxide-simeth (MAALOX/MYLANTA) 200-200-20 MG/5ML suspension Take 15 mLs by mouth every 6 (six) hours as needed. As needed for gas.      Marland Kitchen aspirin 81 MG tablet Take 81 mg by mouth daily.      Marland Kitchen atorvastatin (LIPITOR) 80 MG tablet Take 80 mg by mouth every evening.      Marland Kitchen CALCIUM PO Take by mouth.      . carvedilol (COREG) 6.25 MG tablet Take 1 tablet (6.25 mg total) by mouth 2 (two) times daily with a meal.  60 tablet  6  . clopidogrel (PLAVIX) 75 MG tablet Take 1 tablet (75 mg total) by mouth daily.  30 tablet  6  . fish oil-omega-3 fatty acids 1000 MG capsule Take 2 g by mouth 2 (two) times daily.       . hydrochlorothiazide (MICROZIDE) 12.5 MG capsule take 1 capsule by mouth once daily  30 capsule  1  . lisinopril (PRINIVIL,ZESTRIL) 10 MG tablet Take 1 tablet (10 mg total) by mouth daily.  30 tablet  6  . lisinopril (PRINIVIL,ZESTRIL) 5 MG tablet take 1 tablet by mouth once daily  30 tablet  1  . loperamide (IMODIUM A-D) 2 MG tablet Take 2 mg by mouth 4 (four) times daily as needed. As needed for loose stools.      . nitroGLYCERIN (NITROSTAT) 0.4 MG SL tablet Place 1 tablet (0.4 mg total) under the tongue every 5 (five) minutes as needed for chest pain.  25 tablet  12  . psyllium (METAMUCIL) 58.6 % powder Take 1 packet by mouth daily.          Allergies  Allergen Reactions  . Latex Itching  . Flagyl (Metronidazole) Rash    Past Medical History  Diagnosis Date  . Hypertension   . CVA (cerebral vascular accident)     Right brain CVA 04/2011 - carotid dopplers showed no significant stenosis (tortuous ICA).   . Diverticulosis   . Subdural hematoma     2004 s/p craniotomy  . Pyelonephritis 2006  . Abnormal CT of the abdomen     Nodes  resolved  . Inguinal hernia     Right, seen on CT 2008  . Diverticulosis   . Osteoporosis   . Coronary artery disease     a. [place] s/p BMS to prox LAD (with residual moderate mid-LAD & mid-RCA dz for medical therapy) 01/2012 - EF 40% at that time by cath.  . Arthritis   . LV dysfunction     EF 40% by cath 01/2012 with WMA, but 50% by f/u echo same admission    Past Surgical History  Procedure Date  . Tonsillectomy and adenoidectomy   . Cesarean section     X 2  . Craniotomy 2004  . Total abdominal hysterectomy 1985    FIBROID  . Coronary stent placement  02/07/2012    LAD  . Cardiac catheterization 02/07/2012    History  Smoking status  . Former Smoker  . Types: Cigarettes  . Quit date: 04/10/2003  Smokeless tobacco  . Never Used  Comment: quit 2004    History  Alcohol Use  . Yes    occasional    History reviewed. No pertinent family history.  Review of Systems: As noted in history of present illness.  All other systems were reviewed and are negative.  Physical Exam: BP 138/90  Pulse 72  Ht 4\' 11"  (1.499 m)  Wt 114 lb 12.8 oz (52.073 kg)  BMI 23.19 kg/m2  SpO2 99% She is a pleasant, elderly white female in no acute distress. Her HEENT exam is unremarkable. She has no JVD or bruits. Lungs are clear. Cardiac exam reveals a regular rate and rhythm without gallop, murmur, or click. Abdomen is soft and nontender without masses or bruits. Extremities are without bruits or edema. Pedal pulses are good. She is alert and oriented x3. Cranial nerves II through XII are  intact. LABORATORY DATA:   Assessment / Plan: 1. Coronary disease status post stenting of the proximal LAD in July 4 unstable angina. This was a bare-metal stent. I recommended one year of dual antplatelet therapy iwith aspirin and Plavix.  2. Hypertension, well controlled.  3. Hyperlipidemia. She is on high-dose statin and fish oil therapy.  4. history of subdural hematoma following trauma in 2004.  5. History of CVA in October 2012.

## 2012-07-06 ENCOUNTER — Other Ambulatory Visit: Payer: Self-pay | Admitting: Physician Assistant

## 2012-08-16 ENCOUNTER — Encounter (HOSPITAL_COMMUNITY)
Admission: RE | Admit: 2012-08-16 | Discharge: 2012-08-16 | Disposition: A | Discharge status: Home / Self Care | Payer: Medicare Other | Source: Ambulatory Visit | Attending: Cardiology | Admitting: Cardiology

## 2012-08-16 DIAGNOSIS — Z5189 Encounter for other specified aftercare: Secondary | ICD-10-CM | POA: Insufficient documentation

## 2012-08-16 DIAGNOSIS — Z79899 Other long term (current) drug therapy: Secondary | ICD-10-CM | POA: Insufficient documentation

## 2012-08-16 DIAGNOSIS — I10 Essential (primary) hypertension: Secondary | ICD-10-CM | POA: Insufficient documentation

## 2012-08-16 DIAGNOSIS — I200 Unstable angina: Secondary | ICD-10-CM | POA: Insufficient documentation

## 2012-08-16 DIAGNOSIS — I2510 Atherosclerotic heart disease of native coronary artery without angina pectoris: Secondary | ICD-10-CM | POA: Insufficient documentation

## 2012-08-16 NOTE — Progress Notes (Signed)
Cardiac Rehab Medication Review by a Pharmacist  Does the patient  feel that his/her medications are working for him/her?  yes  Has the patient been experiencing any side effects to the medications prescribed?  no  Does the patient measure his/her own blood pressure or blood glucose at home?  no   Does the patient have any problems obtaining medications due to transportation or finances?   no  Understanding of regimen: fair Understanding of indications: fair Potential of compliance: good    Pharmacist comments: patient had poor understanding of her medications. We discussed indications and mechanisms for all medications to improve understanding. Patient is not sure what dose of lisinopril she is taking, medication history indicates 10 mg and 5 mg dose, will clarify with pharmacy. Patient has good compliance and is not having any side effects.   Bola A. Wandra Feinstein D Clinical Pharmacist Pager:[phone] Phone [phone] 08/16/2012 9:03 AM

## 2012-08-17 ENCOUNTER — Other Ambulatory Visit: Payer: Self-pay | Admitting: Physician Assistant

## 2012-08-20 ENCOUNTER — Encounter (HOSPITAL_COMMUNITY)
Admission: RE | Admit: 2012-08-20 | Discharge: 2012-08-20 | Disposition: A | Discharge status: Home / Self Care | Payer: Medicare Other | Source: Ambulatory Visit | Attending: Cardiology | Admitting: Cardiology

## 2012-08-20 NOTE — Progress Notes (Signed)
Pt started cardiac rehab today.  Pt tolerated light exercise without difficulty. Telemetry rhythm Sinus without ectopy. Vital signs stable. Will continue to monitor the patient throughout  the program.  

## 2012-08-22 ENCOUNTER — Encounter (HOSPITAL_COMMUNITY)
Admission: RE | Admit: 2012-08-22 | Discharge: 2012-08-22 | Disposition: A | Discharge status: Home / Self Care | Payer: Medicare Other | Source: Ambulatory Visit | Attending: Cardiology | Admitting: Cardiology

## 2012-08-24 ENCOUNTER — Encounter (HOSPITAL_COMMUNITY): Payer: Medicare Other

## 2012-08-27 ENCOUNTER — Encounter (HOSPITAL_COMMUNITY)
Admission: RE | Admit: 2012-08-27 | Discharge: 2012-08-27 | Disposition: A | Discharge status: Home / Self Care | Payer: Medicare Other | Source: Ambulatory Visit | Attending: Cardiology | Admitting: Cardiology

## 2012-08-29 ENCOUNTER — Encounter (HOSPITAL_COMMUNITY)
Admission: RE | Admit: 2012-08-29 | Discharge: 2012-08-29 | Disposition: A | Discharge status: Home / Self Care | Payer: Medicare Other | Source: Ambulatory Visit | Attending: Cardiology | Admitting: Cardiology

## 2012-08-29 NOTE — Progress Notes (Signed)
Reviewed home exercise with pt today.  Pt plans to walk at home for exercise.  Reviewed THR, pulse (needs practice), RPE, sign and symptoms, NTG use, and when to call 911 or MD.  Pt voiced understanding. Yoanna Jurczyk, MA, ACSM RCEP  

## 2012-08-31 ENCOUNTER — Encounter (HOSPITAL_COMMUNITY)
Admission: RE | Admit: 2012-08-31 | Discharge: 2012-08-31 | Disposition: A | Discharge status: Home / Self Care | Payer: Medicare Other | Source: Ambulatory Visit | Attending: Cardiology | Admitting: Cardiology

## 2012-09-03 ENCOUNTER — Encounter (HOSPITAL_COMMUNITY)
Admission: RE | Admit: 2012-09-03 | Discharge: 2012-09-03 | Disposition: A | Discharge status: Home / Self Care | Payer: Medicare Other | Source: Ambulatory Visit | Attending: Cardiology | Admitting: Cardiology

## 2012-09-05 ENCOUNTER — Encounter (HOSPITAL_COMMUNITY)
Admission: RE | Admit: 2012-09-05 | Discharge: 2012-09-05 | Disposition: A | Discharge status: Home / Self Care | Payer: Medicare Other | Source: Ambulatory Visit | Attending: Cardiology | Admitting: Cardiology

## 2012-09-07 ENCOUNTER — Encounter (HOSPITAL_COMMUNITY)
Admission: RE | Admit: 2012-09-07 | Discharge: 2012-09-07 | Disposition: A | Discharge status: Home / Self Care | Payer: Medicare Other | Source: Ambulatory Visit | Attending: Cardiology | Admitting: Cardiology

## 2012-09-10 ENCOUNTER — Telehealth: Payer: Self-pay | Admitting: Cardiology

## 2012-09-10 ENCOUNTER — Encounter (HOSPITAL_COMMUNITY): Payer: Medicare Other

## 2012-09-10 NOTE — Telephone Encounter (Signed)
Left message for call back and speak with Anabel Halon LPN on Tuesday or Wednesday. Advised need to know what data she needs in the letter.

## 2012-09-10 NOTE — Telephone Encounter (Signed)
New Prob    Pt requesting a letter be sent to her [employer] office to change her hours to work around her cardiac rehab. Would like to speak to nurse.

## 2012-09-11 NOTE — Telephone Encounter (Signed)
Patient called no answer.LMTC. 

## 2012-09-12 ENCOUNTER — Encounter (HOSPITAL_COMMUNITY)
Admission: RE | Admit: 2012-09-12 | Discharge: 2012-09-12 | Disposition: A | Discharge status: Home / Self Care | Payer: Medicare Other | Source: Ambulatory Visit | Attending: Cardiology | Admitting: Cardiology

## 2012-09-12 DIAGNOSIS — I2510 Atherosclerotic heart disease of native coronary artery without angina pectoris: Secondary | ICD-10-CM | POA: Insufficient documentation

## 2012-09-12 DIAGNOSIS — Z5189 Encounter for other specified aftercare: Secondary | ICD-10-CM | POA: Insufficient documentation

## 2012-09-12 DIAGNOSIS — Z79899 Other long term (current) drug therapy: Secondary | ICD-10-CM | POA: Insufficient documentation

## 2012-09-12 DIAGNOSIS — I10 Essential (primary) hypertension: Secondary | ICD-10-CM | POA: Insufficient documentation

## 2012-09-12 DIAGNOSIS — I200 Unstable angina: Secondary | ICD-10-CM | POA: Insufficient documentation

## 2012-09-12 NOTE — Telephone Encounter (Signed)
New Prob   Pt returning phone call from yesterday.

## 2012-09-12 NOTE — Telephone Encounter (Signed)
Patient walked in office.Stated she needed a letter, stating she needs to have work hours adjusted on mon-wed-fri for 3 months.States she goes to cardiac rehab mon-wed-fri from 9:45 to 11:30 am for the next 3 months.Will type letter and have Dr.Jordan sign 09/13/12.Patient requested letter be faxed to [facility] fax # [phone].Patient's employee ID # [id number].

## 2012-09-13 NOTE — Telephone Encounter (Signed)
Dr.Jordan signed letter it was faxed to [facility] fax # [phone].

## 2012-09-14 ENCOUNTER — Encounter (HOSPITAL_COMMUNITY): Payer: Medicare Other

## 2012-09-17 ENCOUNTER — Encounter (HOSPITAL_COMMUNITY)
Admission: RE | Admit: 2012-09-17 | Discharge: 2012-09-17 | Disposition: A | Discharge status: Home / Self Care | Payer: Medicare Other | Source: Ambulatory Visit | Attending: Cardiology | Admitting: Cardiology

## 2012-09-17 ENCOUNTER — Encounter (HOSPITAL_COMMUNITY): Payer: Medicare Other

## 2012-09-18 ENCOUNTER — Other Ambulatory Visit: Payer: Self-pay | Admitting: Physician Assistant

## 2012-09-19 ENCOUNTER — Encounter (HOSPITAL_COMMUNITY)
Admission: RE | Admit: 2012-09-19 | Discharge: 2012-09-19 | Disposition: A | Discharge status: Home / Self Care | Payer: Medicare Other | Source: Ambulatory Visit | Attending: Cardiology | Admitting: Cardiology

## 2012-09-21 ENCOUNTER — Encounter (HOSPITAL_COMMUNITY)
Admission: RE | Admit: 2012-09-21 | Discharge: 2012-09-21 | Disposition: A | Discharge status: Home / Self Care | Payer: Medicare Other | Source: Ambulatory Visit | Attending: Cardiology | Admitting: Cardiology

## 2012-09-24 ENCOUNTER — Encounter (HOSPITAL_COMMUNITY)
Admission: RE | Admit: 2012-09-24 | Discharge: 2012-09-24 | Disposition: A | Discharge status: Home / Self Care | Payer: Medicare Other | Source: Ambulatory Visit | Attending: Cardiology | Admitting: Cardiology

## 2012-09-24 NOTE — Progress Notes (Signed)
[name] 79 y.o. female Nutrition Note Spoke with pt. Pt reports her UBW "133# a year ago." Pt's wt today 51.0 kg (112.2 lb). Pt's wt is down 20.8 lbs over the past year. Pt's wt is up 1.1 kg over the past month. Per discussion, pt is drinking Boost "3 a day if I can afford it." Pt was unsure how many Boosts her MD wanted her to drink daily. Less expensive supplements discussed. Nutrition Plan and Nutrition Survey goals reviewed with pt. Pt is following Step 1 of the Therapeutic Lifestyle Changes diet. Pt expressed understanding of the information reviewed. Pt aware of nutrition education classes offered and may be able to attend nutrition classes.  Nutrition Diagnosis   Food-and nutrition-related knowledge deficit related to lack of exposure to information as related to diagnosis of: ? CVD   Nutrition RX/ Estimated Daily Nutrition Needs for: wt maintenance 1250-1450 Kcal, 40-45 gm fat, 8-10 gm sat fat, 1.2-1.4 gm trans-fat, <1500 mg sodium   Nutrition Intervention   Pt's individual nutrition plan including cholesterol goals reviewed with pt.   Benefits of adopting Therapeutic Lifestyle Changes discussed when Medficts reviewed.   Pt to attend the Portion Distortion class - met 09/12/12   Pt to attend the  ? Nutrition I class                     ? Nutrition II class   Pt given handouts for: ? Nutrition I class ? Nutrition II class    Continue client-centered nutrition education by RD, as part of interdisciplinary care.  Goal(s)   Pt to identify and limit food sources of saturated fat, trans fat, and cholesterol   Pt to identify food quantities necessary to achieve: ? wt maintenance at graduation from cardiac rehab.   Monitor and Evaluate progress toward nutrition goal with team. Nutrition Risk:  Low   Mickle Plumb, M.Ed, RD, LDN, CDE 09/24/2012 10:41 AM

## 2012-09-26 ENCOUNTER — Encounter (HOSPITAL_COMMUNITY)
Admission: RE | Admit: 2012-09-26 | Discharge: 2012-09-26 | Disposition: A | Discharge status: Home / Self Care | Payer: Medicare Other | Source: Ambulatory Visit | Attending: Cardiology | Admitting: Cardiology

## 2012-09-28 ENCOUNTER — Encounter (HOSPITAL_COMMUNITY)
Admission: RE | Admit: 2012-09-28 | Discharge: 2012-09-28 | Disposition: A | Discharge status: Home / Self Care | Payer: Medicare Other | Source: Ambulatory Visit | Attending: Cardiology | Admitting: Cardiology

## 2012-10-01 ENCOUNTER — Encounter (HOSPITAL_COMMUNITY)
Admission: RE | Admit: 2012-10-01 | Discharge: 2012-10-01 | Disposition: A | Discharge status: Home / Self Care | Payer: Medicare Other | Source: Ambulatory Visit | Attending: Cardiology | Admitting: Cardiology

## 2012-10-03 ENCOUNTER — Encounter (HOSPITAL_COMMUNITY)
Admission: RE | Admit: 2012-10-03 | Discharge: 2012-10-03 | Disposition: A | Discharge status: Home / Self Care | Payer: Medicare Other | Source: Ambulatory Visit | Attending: Cardiology | Admitting: Cardiology

## 2012-10-05 ENCOUNTER — Encounter (HOSPITAL_COMMUNITY)
Admission: RE | Admit: 2012-10-05 | Discharge: 2012-10-05 | Disposition: A | Discharge status: Home / Self Care | Payer: Medicare Other | Source: Ambulatory Visit | Attending: Cardiology | Admitting: Cardiology

## 2012-10-08 ENCOUNTER — Encounter (HOSPITAL_COMMUNITY)
Admission: RE | Admit: 2012-10-08 | Discharge: 2012-10-08 | Disposition: A | Discharge status: Home / Self Care | Payer: Medicare Other | Source: Ambulatory Visit | Attending: Cardiology | Admitting: Cardiology

## 2012-10-10 ENCOUNTER — Encounter (HOSPITAL_COMMUNITY)
Admission: RE | Admit: 2012-10-10 | Discharge: 2012-10-10 | Disposition: A | Discharge status: Home / Self Care | Payer: Medicare Other | Source: Ambulatory Visit | Attending: Cardiology | Admitting: Cardiology

## 2012-10-10 DIAGNOSIS — I2510 Atherosclerotic heart disease of native coronary artery without angina pectoris: Secondary | ICD-10-CM | POA: Insufficient documentation

## 2012-10-10 DIAGNOSIS — I200 Unstable angina: Secondary | ICD-10-CM | POA: Insufficient documentation

## 2012-10-10 DIAGNOSIS — I10 Essential (primary) hypertension: Secondary | ICD-10-CM | POA: Insufficient documentation

## 2012-10-10 DIAGNOSIS — Z79899 Other long term (current) drug therapy: Secondary | ICD-10-CM | POA: Insufficient documentation

## 2012-10-10 DIAGNOSIS — Z5189 Encounter for other specified aftercare: Secondary | ICD-10-CM | POA: Insufficient documentation

## 2012-10-12 ENCOUNTER — Telehealth (HOSPITAL_COMMUNITY): Payer: Self-pay | Admitting: *Deleted

## 2012-10-12 ENCOUNTER — Encounter (HOSPITAL_COMMUNITY): Admission: RE | Admit: 2012-10-12 | Payer: Medicare Other | Source: Ambulatory Visit

## 2012-10-15 ENCOUNTER — Encounter (HOSPITAL_COMMUNITY)
Admission: RE | Admit: 2012-10-15 | Discharge: 2012-10-15 | Disposition: A | Discharge status: Home / Self Care | Payer: Medicare Other | Source: Ambulatory Visit | Attending: Cardiology | Admitting: Cardiology

## 2012-10-17 ENCOUNTER — Encounter (HOSPITAL_COMMUNITY)
Admission: RE | Admit: 2012-10-17 | Discharge: 2012-10-17 | Disposition: A | Discharge status: Home / Self Care | Payer: Medicare Other | Source: Ambulatory Visit | Attending: Cardiology | Admitting: Cardiology

## 2012-10-18 ENCOUNTER — Encounter: Payer: Self-pay | Admitting: Cardiology

## 2012-10-18 ENCOUNTER — Ambulatory Visit (INDEPENDENT_AMBULATORY_CARE_PROVIDER_SITE_OTHER): Payer: Medicare Other | Admitting: Cardiology

## 2012-10-18 VITALS — BP 122/82 | HR 62 | Ht 63.0 in | Wt 110.0 lb

## 2012-10-18 DIAGNOSIS — I10 Essential (primary) hypertension: Secondary | ICD-10-CM

## 2012-10-18 DIAGNOSIS — E785 Hyperlipidemia, unspecified: Secondary | ICD-10-CM

## 2012-10-18 DIAGNOSIS — I25810 Atherosclerosis of coronary artery bypass graft(s) without angina pectoris: Secondary | ICD-10-CM

## 2012-10-18 LAB — LIPID PANEL: HDL: 42.3 mg/dL (ref >39.00)

## 2012-10-18 LAB — HEPATIC FUNCTION PANEL
ALT: 16 U/L (ref 0–35)
AST: 16 U/L (ref 0–37)
Alkaline Phosphatase: 76 U/L (ref 39–117)
Total Bilirubin: 0.5 mg/dL (ref 0.3–1.2)

## 2012-10-18 LAB — BASIC METABOLIC PANEL
Calcium: 10 mg/dL (ref 8.4–10.5)
GFR: 92.08 mL/min (ref >60.00)
Glucose, Bld: 82 mg/dL (ref 70–99)
Sodium: 138 mEq/L (ref 135–145)

## 2012-10-18 NOTE — Progress Notes (Signed)
[name] Date of Birth: 1934-03-20 Medical Record #[id number]  History of Present Illness: [name] is seen for followup today. She is status post stenting of the proximal LAD with a bare-metal stent in July of 2013. Ejection fraction was 50% by echocardiogram. She continues to do very well and denies any recurrent chest pain, shortness of breath, dizziness, or palpitations. She is back working in a department store. She continues to go to cardiac rehabilitation.  Current Outpatient Prescriptions on File Prior to Visit  Medication Sig Dispense Refill  . alum & mag hydroxide-simeth (MAALOX/MYLANTA) 200-200-20 MG/5ML suspension Take 15 mLs by mouth every 6 (six) hours as needed. As needed for gas.      Marland Kitchen aspirin 81 MG tablet Take 81 mg by mouth daily.      Marland Kitchen atorvastatin (LIPITOR) 80 MG tablet take 1 tablet by mouth once daily  30 tablet  1  . CALCIUM PO Take 600 Units by mouth 2 (two) times daily.       . carvedilol (COREG) 6.25 MG tablet Take 1 tablet (6.25 mg total) by mouth 2 (two) times daily with a meal.  60 tablet  6  . cetirizine (ZYRTEC) 10 MG tablet Take 10 mg by mouth daily as needed.      . clopidogrel (PLAVIX) 75 MG tablet Take 1 tablet (75 mg total) by mouth daily.  30 tablet  6  . fish oil-omega-3 fatty acids 1000 MG capsule Take 2 g by mouth 2 (two) times daily.       . hydrochlorothiazide (MICROZIDE) 12.5 MG capsule Take 1 capsule (12.5 mg total) by mouth daily. Needs office visit  30 capsule  0  . lisinopril (PRINIVIL,ZESTRIL) 5 MG tablet Take 1 tablet (5 mg total) by mouth daily. Needs office visit  30 tablet  0  . loperamide (IMODIUM A-D) 2 MG tablet Take 2 mg by mouth 4 (four) times daily as needed. As needed for loose stools.      . naphazoline-glycerin (CLEAR EYES) 0.012-0.2 % SOLN Place 1 drop into both eyes every 4 (four) hours as needed. For irritated eyes      . nitroGLYCERIN (NITROSTAT) 0.4 MG SL tablet Place 1 tablet (0.4 mg total) under the tongue every 5  (five) minutes as needed for chest pain.  25 tablet  12  . psyllium (METAMUCIL) 58.6 % powder Take 1 packet by mouth daily as needed.        No current facility-administered medications on file prior to visit.    Allergies  Allergen Reactions  . Latex Itching  . Flagyl (Metronidazole) Rash    Past Medical History  Diagnosis Date  . Hypertension   . CVA (cerebral vascular accident)     Right brain CVA 04/2011 - carotid dopplers showed no significant stenosis (tortuous ICA).   . Diverticulosis   . Subdural hematoma     2004 s/p craniotomy  . Pyelonephritis 2006  . Abnormal CT of the abdomen     Nodes  resolved  . Inguinal hernia     Right, seen on CT 2008  . Diverticulosis   . Osteoporosis   . Coronary artery disease     a. [place] s/p BMS to prox LAD (with residual moderate mid-LAD & mid-RCA dz for medical therapy) 01/2012 - EF 40% at that time by cath.  . Arthritis   . LV dysfunction     EF 40% by cath 01/2012 with WMA, but 50% by f/u echo same admission  Past Surgical History  Procedure Laterality Date  . Tonsillectomy and adenoidectomy    . Cesarean section      X 2  . Craniotomy  2004  . Total abdominal hysterectomy  1985    FIBROID  . Coronary stent placement  02/07/2012    LAD  . Cardiac catheterization  02/07/2012    History  Smoking status  . Former Smoker  . Types: Cigarettes  . Quit date: 04/10/2003  Smokeless tobacco  . Never Used    Comment: quit 2004    History  Alcohol Use  . Yes    Comment: occasional    History reviewed. No pertinent family history.  Review of Systems: As noted in history of present illness.  All other systems were reviewed and are negative.  Physical Exam: BP 122/82  Pulse 62  Ht 5\' 3"  (1.6 m)  Wt 110 lb (49.896 kg)  BMI 19.49 kg/m2 She is a pleasant, elderly white female in no acute distress. Her HEENT exam is unremarkable. She has no JVD or bruits. Lungs are clear. Cardiac exam reveals a regular rate and rhythm  without gallop, murmur, or click. Abdomen is soft and nontender without masses or bruits. Extremities are without bruits or edema. Pedal pulses are good. She is alert and oriented x3. Cranial nerves II through XII are intact. LABORATORY DATA:   Assessment / Plan: 1. Coronary disease status post stenting of the proximal LAD in July 2013 for unstable angina. This was a bare-metal stent. I recommended one year of dual antplatelet therapy iwith aspirin and Plavix. She may stop Plavix on August 1.  2. Hypertension, well controlled.  3. Hyperlipidemia. She is on high-dose statin and fish oil therapy. We will followup fasting lab work today.  4. history of subdural hematoma following trauma in 2004.  5. History of CVA in October 2012.

## 2012-10-18 NOTE — Patient Instructions (Signed)
Continue your current medication  You may stop taking Plavix on February 08, 2013  We will call with the results of your lab work today.

## 2012-10-19 ENCOUNTER — Encounter (HOSPITAL_COMMUNITY)
Admission: RE | Admit: 2012-10-19 | Discharge: 2012-10-19 | Disposition: A | Discharge status: Home / Self Care | Payer: Medicare Other | Source: Ambulatory Visit | Attending: Cardiology | Admitting: Cardiology

## 2012-10-22 ENCOUNTER — Encounter (HOSPITAL_COMMUNITY)
Admission: RE | Admit: 2012-10-22 | Discharge: 2012-10-22 | Disposition: A | Discharge status: Home / Self Care | Payer: Medicare Other | Source: Ambulatory Visit | Attending: Cardiology | Admitting: Cardiology

## 2012-10-23 ENCOUNTER — Other Ambulatory Visit: Payer: Self-pay | Admitting: Physician Assistant

## 2012-10-24 ENCOUNTER — Encounter (HOSPITAL_COMMUNITY)
Admission: RE | Admit: 2012-10-24 | Discharge: 2012-10-24 | Disposition: A | Discharge status: Home / Self Care | Payer: Medicare Other | Source: Ambulatory Visit | Attending: Cardiology | Admitting: Cardiology

## 2012-10-26 ENCOUNTER — Other Ambulatory Visit: Payer: Self-pay | Admitting: Physician Assistant

## 2012-10-26 ENCOUNTER — Encounter (HOSPITAL_COMMUNITY)
Admission: RE | Admit: 2012-10-26 | Discharge: 2012-10-26 | Disposition: A | Discharge status: Home / Self Care | Payer: Medicare Other | Source: Ambulatory Visit | Attending: Cardiology | Admitting: Cardiology

## 2012-10-29 ENCOUNTER — Encounter (HOSPITAL_COMMUNITY)
Admission: RE | Admit: 2012-10-29 | Discharge: 2012-10-29 | Disposition: A | Discharge status: Home / Self Care | Payer: Medicare Other | Source: Ambulatory Visit | Attending: Cardiology | Admitting: Cardiology

## 2012-10-31 ENCOUNTER — Encounter (HOSPITAL_COMMUNITY)
Admission: RE | Admit: 2012-10-31 | Discharge: 2012-10-31 | Disposition: A | Discharge status: Home / Self Care | Payer: Medicare Other | Source: Ambulatory Visit | Attending: Cardiology | Admitting: Cardiology

## 2012-11-02 ENCOUNTER — Encounter (HOSPITAL_COMMUNITY)
Admission: RE | Admit: 2012-11-02 | Discharge: 2012-11-02 | Disposition: A | Discharge status: Home / Self Care | Payer: Medicare Other | Source: Ambulatory Visit | Attending: Cardiology | Admitting: Cardiology

## 2012-11-05 ENCOUNTER — Other Ambulatory Visit: Payer: Self-pay | Admitting: Physician Assistant

## 2012-11-05 ENCOUNTER — Encounter (HOSPITAL_COMMUNITY)
Admission: RE | Admit: 2012-11-05 | Discharge: 2012-11-05 | Disposition: A | Discharge status: Home / Self Care | Payer: Medicare Other | Source: Ambulatory Visit | Attending: Cardiology | Admitting: Cardiology

## 2012-11-07 ENCOUNTER — Encounter (HOSPITAL_COMMUNITY)
Admission: RE | Admit: 2012-11-07 | Discharge: 2012-11-07 | Disposition: A | Discharge status: Home / Self Care | Payer: Medicare Other | Source: Ambulatory Visit | Attending: Cardiology | Admitting: Cardiology

## 2012-11-09 ENCOUNTER — Encounter (HOSPITAL_COMMUNITY)
Admission: RE | Admit: 2012-11-09 | Discharge: 2012-11-09 | Disposition: A | Discharge status: Home / Self Care | Payer: Medicare Other | Source: Ambulatory Visit | Attending: Cardiology | Admitting: Cardiology

## 2012-11-09 DIAGNOSIS — Z5189 Encounter for other specified aftercare: Secondary | ICD-10-CM | POA: Insufficient documentation

## 2012-11-09 DIAGNOSIS — I10 Essential (primary) hypertension: Secondary | ICD-10-CM | POA: Insufficient documentation

## 2012-11-09 DIAGNOSIS — I200 Unstable angina: Secondary | ICD-10-CM | POA: Insufficient documentation

## 2012-11-09 DIAGNOSIS — I2510 Atherosclerotic heart disease of native coronary artery without angina pectoris: Secondary | ICD-10-CM | POA: Insufficient documentation

## 2012-11-09 DIAGNOSIS — Z79899 Other long term (current) drug therapy: Secondary | ICD-10-CM | POA: Insufficient documentation

## 2012-11-12 ENCOUNTER — Encounter (HOSPITAL_COMMUNITY)
Admission: RE | Admit: 2012-11-12 | Discharge: 2012-11-12 | Disposition: A | Discharge status: Home / Self Care | Payer: Medicare Other | Source: Ambulatory Visit | Attending: Cardiology | Admitting: Cardiology

## 2012-11-14 ENCOUNTER — Encounter (HOSPITAL_COMMUNITY)
Admission: RE | Admit: 2012-11-14 | Discharge: 2012-11-14 | Disposition: A | Discharge status: Home / Self Care | Payer: Medicare Other | Source: Ambulatory Visit | Attending: Cardiology | Admitting: Cardiology

## 2012-11-16 ENCOUNTER — Encounter (HOSPITAL_COMMUNITY)
Admission: RE | Admit: 2012-11-16 | Discharge: 2012-11-16 | Disposition: A | Discharge status: Home / Self Care | Payer: Medicare Other | Source: Ambulatory Visit | Attending: Cardiology | Admitting: Cardiology

## 2012-11-16 NOTE — Progress Notes (Signed)
[name] plans to continue exercise on her own.  [name] is going to check on [employer] and may consider cardiac maintenance in the future.

## 2012-11-19 ENCOUNTER — Encounter (HOSPITAL_COMMUNITY): Payer: Medicare Other

## 2012-11-21 ENCOUNTER — Encounter (HOSPITAL_COMMUNITY): Payer: Medicare Other

## 2012-11-23 ENCOUNTER — Encounter (HOSPITAL_COMMUNITY): Payer: Medicare Other

## 2012-12-13 ENCOUNTER — Other Ambulatory Visit: Payer: Self-pay | Admitting: Physician Assistant

## 2012-12-13 ENCOUNTER — Other Ambulatory Visit: Payer: Self-pay | Admitting: Emergency Medicine

## 2012-12-13 ENCOUNTER — Other Ambulatory Visit: Payer: Self-pay | Admitting: Family Medicine

## 2013-01-02 ENCOUNTER — Emergency Department (HOSPITAL_COMMUNITY)
Admission: EM | Admit: 2013-01-02 | Discharge: 2013-01-03 | Disposition: A | Discharge status: Home / Self Care | Payer: Medicare Other | Attending: Emergency Medicine | Admitting: Emergency Medicine

## 2013-01-02 ENCOUNTER — Encounter (HOSPITAL_COMMUNITY): Payer: Self-pay | Admitting: Emergency Medicine

## 2013-01-02 DIAGNOSIS — Z91040 Latex allergy status: Secondary | ICD-10-CM | POA: Insufficient documentation

## 2013-01-02 DIAGNOSIS — Z7902 Long term (current) use of antithrombotics/antiplatelets: Secondary | ICD-10-CM | POA: Insufficient documentation

## 2013-01-02 DIAGNOSIS — Z8673 Personal history of transient ischemic attack (TIA), and cerebral infarction without residual deficits: Secondary | ICD-10-CM | POA: Insufficient documentation

## 2013-01-02 DIAGNOSIS — S0990XA Unspecified injury of head, initial encounter: Secondary | ICD-10-CM | POA: Insufficient documentation

## 2013-01-02 DIAGNOSIS — Z8679 Personal history of other diseases of the circulatory system: Secondary | ICD-10-CM | POA: Insufficient documentation

## 2013-01-02 DIAGNOSIS — M129 Arthropathy, unspecified: Secondary | ICD-10-CM | POA: Insufficient documentation

## 2013-01-02 DIAGNOSIS — Z87448 Personal history of other diseases of urinary system: Secondary | ICD-10-CM | POA: Insufficient documentation

## 2013-01-02 DIAGNOSIS — Z79899 Other long term (current) drug therapy: Secondary | ICD-10-CM | POA: Insufficient documentation

## 2013-01-02 DIAGNOSIS — Z8719 Personal history of other diseases of the digestive system: Secondary | ICD-10-CM | POA: Insufficient documentation

## 2013-01-02 DIAGNOSIS — S0083XA Contusion of other part of head, initial encounter: Secondary | ICD-10-CM

## 2013-01-02 DIAGNOSIS — I2510 Atherosclerotic heart disease of native coronary artery without angina pectoris: Secondary | ICD-10-CM | POA: Insufficient documentation

## 2013-01-02 DIAGNOSIS — Z87891 Personal history of nicotine dependence: Secondary | ICD-10-CM | POA: Insufficient documentation

## 2013-01-02 DIAGNOSIS — I10 Essential (primary) hypertension: Secondary | ICD-10-CM | POA: Insufficient documentation

## 2013-01-02 DIAGNOSIS — Z9861 Coronary angioplasty status: Secondary | ICD-10-CM | POA: Insufficient documentation

## 2013-01-02 DIAGNOSIS — S0003XA Contusion of scalp, initial encounter: Secondary | ICD-10-CM | POA: Insufficient documentation

## 2013-01-02 DIAGNOSIS — M810 Age-related osteoporosis without current pathological fracture: Secondary | ICD-10-CM | POA: Insufficient documentation

## 2013-01-02 DIAGNOSIS — R519 Headache, unspecified: Secondary | ICD-10-CM

## 2013-01-02 DIAGNOSIS — H6120 Impacted cerumen, unspecified ear: Secondary | ICD-10-CM | POA: Insufficient documentation

## 2013-01-02 DIAGNOSIS — S1093XA Contusion of unspecified part of neck, initial encounter: Secondary | ICD-10-CM | POA: Insufficient documentation

## 2013-01-02 DIAGNOSIS — Z7982 Long term (current) use of aspirin: Secondary | ICD-10-CM | POA: Insufficient documentation
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
## 2013-01-03 MED ORDER — IBUPROFEN 400 MG PO TABS: 400.0000 mg | ORAL_TABLET | Freq: Once | ORAL | Status: DC

## 2013-01-03 MED ORDER — ACETAMINOPHEN 325 MG PO TABS
650.0000 mg | ORAL_TABLET | Freq: Once | ORAL | Status: AC
  Administered 2013-01-03: 650 mg via ORAL
  Filled 2013-01-03: qty 2
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
## 2013-01-03 NOTE — ED Notes (Signed)
MD at bedside. 

## 2013-01-07 ENCOUNTER — Ambulatory Visit (INDEPENDENT_AMBULATORY_CARE_PROVIDER_SITE_OTHER): Payer: Medicare Other | Admitting: Emergency Medicine

## 2013-01-07 VITALS — BP 122/76 | HR 72 | Temp 98.2°F | Resp 18 | Ht <= 58 in | Wt 108.0 lb

## 2013-01-07 DIAGNOSIS — I2510 Atherosclerotic heart disease of native coronary artery without angina pectoris: Secondary | ICD-10-CM

## 2013-01-07 DIAGNOSIS — R634 Abnormal weight loss: Secondary | ICD-10-CM

## 2013-01-07 DIAGNOSIS — E782 Mixed hyperlipidemia: Secondary | ICD-10-CM

## 2013-01-07 DIAGNOSIS — I6350 Cerebral infarction due to unspecified occlusion or stenosis of unspecified cerebral artery: Secondary | ICD-10-CM

## 2013-01-07 DIAGNOSIS — I639 Cerebral infarction, unspecified: Secondary | ICD-10-CM

## 2013-01-07 DIAGNOSIS — I10 Essential (primary) hypertension: Secondary | ICD-10-CM

## 2013-01-07 LAB — COMPREHENSIVE METABOLIC PANEL
ALT: 12 U/L (ref 0–35)
AST: 16 U/L (ref 0–37)
Albumin: 4 g/dL (ref 3.5–5.2)
CO2: 28 mEq/L (ref 19–32)
Calcium: 10.3 mg/dL (ref 8.4–10.5)
Chloride: 102 mEq/L (ref 96–112)
Potassium: 4.2 mEq/L (ref 3.5–5.3)

## 2013-01-07 LAB — POCT SEDIMENTATION RATE: POCT SED RATE: 54 mm/hr — AB (ref 0–22)

## 2013-01-07 LAB — POCT CBC
Granulocyte percent: 67.7 %G (ref 37–80)
Hemoglobin: 12.3 g/dL (ref 12.2–16.2)
MCH, POC: 30 pg (ref 27–31.2)
MPV: 9.9 fL (ref 0–99.8)
POC Granulocyte: 5.7 (ref 2–6.9)
POC MID %: 7.2 %M (ref 0–12)
RBC: 4.1 M/uL (ref 4.04–5.48)
WBC: 8.4 10*3/uL (ref 4.6–10.2)

## 2013-01-07 MED ORDER — HYDROCHLOROTHIAZIDE 12.5 MG PO CAPS: 12.5000 mg | ORAL_CAPSULE | ORAL | Status: DC

## 2013-01-07 MED ORDER — CARVEDILOL 6.25 MG PO TABS: 6.2500 mg | ORAL_TABLET | Freq: Two times a day (BID) | ORAL | Status: DC

## 2013-01-07 MED ORDER — LISINOPRIL 5 MG PO TABS: 5.0000 mg | ORAL_TABLET | Freq: Every day | ORAL | Status: DC

## 2013-01-07 MED ORDER — CLOPIDOGREL BISULFATE 75 MG PO TABS: 75.0000 mg | ORAL_TABLET | Freq: Every day | ORAL | Status: DC

## 2013-01-07 MED ORDER — ATORVASTATIN CALCIUM 80 MG PO TABS: 80.0000 mg | ORAL_TABLET | Freq: Every day | ORAL | Status: DC

## 2013-01-07 NOTE — Patient Instructions (Addendum)
I have refilled all of your medications. Please make an appointment to be seen at 104 for followup so I can address your continued weight loss
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
## 2013-01-08 ENCOUNTER — Other Ambulatory Visit: Payer: Self-pay | Admitting: Physician Assistant

## 2013-01-20 NOTE — ED Provider Notes (Signed)
Medical screening examination/treatment/procedure(s) were performed by non-physician practitioner and as supervising physician I was immediately available for consultation/collaboration.   Chloe Miyoshi, MD 01/20/13 2303 

## 2013-03-29 ENCOUNTER — Other Ambulatory Visit: Payer: Self-pay | Admitting: Physician Assistant

## 2013-05-29 ENCOUNTER — Telehealth: Payer: Self-pay | Admitting: Cardiology

## 2013-05-29 ENCOUNTER — Ambulatory Visit (INDEPENDENT_AMBULATORY_CARE_PROVIDER_SITE_OTHER): Payer: Managed Care, Other (non HMO) | Admitting: Cardiology

## 2013-05-29 ENCOUNTER — Encounter: Payer: Self-pay | Admitting: Cardiology

## 2013-05-29 VITALS — BP 140/90 | HR 69 | Ht 59.0 in | Wt 106.0 lb

## 2013-05-29 DIAGNOSIS — I25810 Atherosclerosis of coronary artery bypass graft(s) without angina pectoris: Secondary | ICD-10-CM

## 2013-05-29 DIAGNOSIS — I2510 Atherosclerotic heart disease of native coronary artery without angina pectoris: Secondary | ICD-10-CM

## 2013-05-29 DIAGNOSIS — I10 Essential (primary) hypertension: Secondary | ICD-10-CM

## 2013-05-29 NOTE — Patient Instructions (Signed)
Stop taking Plavix.  Continue your other medication  I will see you in 6 months.

## 2013-05-29 NOTE — Telephone Encounter (Signed)
Returned call to patient no answer.LMTC. 

## 2013-05-29 NOTE — Progress Notes (Signed)
[name] Date of Birth: [birth year] Medical Record #[id number]  History of Present Illness: [name] is seen for followup today. She is status post stenting of the proximal LAD with a bare-metal stent in July of 2013. Ejection fraction was 50% by echocardiogram. She continues to do very well and denies any recurrent chest pain, shortness of breath, dizziness, or palpitations. She does complain that with any drop in temperature her fingers get very cold. They did not change color.  Current Outpatient Prescriptions on File Prior to Visit  Medication Sig Dispense Refill  . alum & mag hydroxide-simeth (MAALOX/MYLANTA) 200-200-20 MG/5ML suspension Take 15 mLs by mouth every 6 (six) hours as needed. As needed for gas.      Marland Kitchen aspirin 81 MG tablet Take 81 mg by mouth daily.      Marland Kitchen atorvastatin (LIPITOR) 80 MG tablet Take 1 tablet (80 mg total) by mouth daily. PATIENT NEEDS OFFICE VISIT FOR ADDITIONAL REFILLS - FINAL NOTICE  90 tablet  3  . CALCIUM PO Take 600 Units by mouth 2 (two) times daily.       . carvedilol (COREG) 6.25 MG tablet Take 1 tablet (6.25 mg total) by mouth 2 (two) times daily with a meal. PATIENT NEEDS OFFICE VISIT FOR ADDITIONAL REFILLS - FINAL NOTICE  180 tablet  3  . cetirizine (ZYRTEC) 10 MG tablet Take 10 mg by mouth daily as needed.      . fish oil-omega-3 fatty acids 1000 MG capsule Take 2 g by mouth 2 (two) times daily.       . hydrochlorothiazide (MICROZIDE) 12.5 MG capsule Take 1 capsule (12.5 mg total) by mouth every morning. PATIENT NEEDS OFFICE VISIT FOR ADDITIONAL REFILLS - FINAL NOTICE  90 capsule  3  . lisinopril (PRINIVIL,ZESTRIL) 5 MG tablet Take 1 tablet (5 mg total) by mouth daily. PATIENT NEEDS OFFICE VISIT FOR ADDITIONAL REFILLS - FINAL NOTICE  90 tablet  3  . loperamide (IMODIUM A-D) 2 MG tablet Take 2 mg by mouth 4 (four) times daily as needed. As needed for loose stools.      . naphazoline-glycerin (CLEAR EYES) 0.012-0.2 % SOLN Place 1 drop into both  eyes every 4 (four) hours as needed. For irritated eyes      . NITROSTAT 0.4 MG SL tablet place 1 tablet under the tongue every 5 minutes if needed for chest pain  25 tablet  12  . psyllium (METAMUCIL) 58.6 % powder Take 1 packet by mouth daily as needed.        No current facility-administered medications on file prior to visit.    Allergies  Allergen Reactions  . Latex Itching  . Flagyl [Metronidazole] Rash    Past Medical History  Diagnosis Date  . Hypertension   . CVA (cerebral vascular accident)     Right brain CVA 04/2011 - carotid dopplers showed no significant stenosis (tortuous ICA).   . Diverticulosis   . Subdural hematoma     2004 s/p craniotomy  . Pyelonephritis 2006  . Abnormal CT of the abdomen     Nodes  resolved  . Inguinal hernia     Right, seen on CT 2008  . Diverticulosis   . Osteoporosis   . Coronary artery disease     a. [place] s/p BMS to prox LAD (with residual moderate mid-LAD & mid-RCA dz for medical therapy) 01/2012 - EF 40% at that time by cath.  . Arthritis   . LV dysfunction  EF 40% by cath 01/2012 with WMA, but 50% by f/u echo same admission    Past Surgical History  Procedure Laterality Date  . Tonsillectomy and adenoidectomy    . Cesarean section      X 2  . Craniotomy  2004  . Total abdominal hysterectomy  1985    FIBROID  . Coronary stent placement  02/07/2012    LAD  . Cardiac catheterization  02/07/2012    History  Smoking status  . Former Smoker  . Types: Cigarettes  . Quit date: 04/10/2003  Smokeless tobacco  . Never Used    Comment: quit 2004    History  Alcohol Use  . Yes    Comment: occasional    Family History  Problem Relation Age of Onset  . Stroke Father     Review of Systems: As noted in history of present illness.  All other systems were reviewed and are negative.  Physical Exam: BP 140/90  Pulse 69  Ht 4\' 11"  (1.499 m)  Wt 106 lb (48.081 kg)  BMI 21.40 kg/m2 She is a pleasant, elderly white female  in no acute distress. Her HEENT exam is unremarkable. She has no JVD or bruits. Lungs are clear. Cardiac exam reveals a regular rate and rhythm without gallop, murmur, or click. Abdomen is soft and nontender without masses or bruits. Extremities are without bruits or edema. Pedal pulses are good. She is alert and oriented x3. Cranial nerves II through XII are intact. LABORATORY DATA: ECG today demonstrates normal sinus rhythm with nonspecific T-wave abnormality.  Lab Results  Component Value Date   WBC 8.4 01/07/2013   HGB 12.3 01/07/2013   HCT 39.4 01/07/2013   PLT 162 02/08/2012   GLUCOSE 92 01/07/2013   CHOL 104 10/18/2012   TRIG 58.0 10/18/2012   HDL 42.30 10/18/2012   LDLCALC 50 10/18/2012   ALT 12 01/07/2013   AST 16 01/07/2013   NA 140 01/07/2013   K 4.2 01/07/2013   CL 102 01/07/2013   CREATININE 0.75 01/07/2013   BUN 10 01/07/2013   CO2 28 01/07/2013   TSH 2.085 02/07/2012   INR 1.12 02/07/2012   HGBA1C 5.8* 04/13/2011    Assessment / Plan: 1. Coronary disease status post stenting of the proximal LAD in July 2013 for unstable angina. This was a bare-metal stent. I recommended that she may stop Plavix at this point. Continue baby aspirin daily.  2. Hypertension, blood pressure is mildly elevated. She did not take her medications this morning. We will continue on her current therapy.  3. Hyperlipidemia. She is on high-dose statin and fish oil therapy.   4. history of subdural hematoma following trauma in 2004.  5. History of CVA in October 2012.

## 2013-05-29 NOTE — Telephone Encounter (Signed)
New message    Saw Dr [place] this am---have a question regarding medication she is taking for her bp. She said the doctor was taking her off some of her medication, but looking at her printout of her medications---all medications are listed.

## 2013-05-31 NOTE — Telephone Encounter (Signed)
Returned call to patient no answer.LMTC. 

## 2013-06-05 NOTE — Telephone Encounter (Signed)
Returned call to patient she wanted to know what medication did Dr.Jordan want her to stop,at 05/29/13 office visit with Dr.Jordan he advised to stop plavix and continue all other medication.

## 2013-07-18 ENCOUNTER — Other Ambulatory Visit: Payer: Self-pay

## 2013-07-18 DIAGNOSIS — I2510 Atherosclerotic heart disease of native coronary artery without angina pectoris: Secondary | ICD-10-CM

## 2013-07-18 DIAGNOSIS — I10 Essential (primary) hypertension: Secondary | ICD-10-CM

## 2013-07-18 MED ORDER — ATORVASTATIN CALCIUM 80 MG PO TABS: 80.0000 mg | ORAL_TABLET | Freq: Every day | ORAL | Status: DC

## 2013-07-18 MED ORDER — LISINOPRIL 5 MG PO TABS: 5.0000 mg | ORAL_TABLET | Freq: Every day | ORAL | Status: DC

## 2013-07-18 MED ORDER — HYDROCHLOROTHIAZIDE 12.5 MG PO CAPS: 12.5000 mg | ORAL_CAPSULE | ORAL | Status: DC

## 2013-07-18 MED ORDER — CARVEDILOL 6.25 MG PO TABS: 6.2500 mg | ORAL_TABLET | Freq: Two times a day (BID) | ORAL | Status: DC

## 2013-07-18 NOTE — Telephone Encounter (Signed)
req from [facility] for all pts maintenance meds, now getting there locally. I am sending remaining RFs Dr Cleta Albertsaub put on them 01/07/13.

## 2013-07-19 ENCOUNTER — Other Ambulatory Visit: Payer: Self-pay | Admitting: Emergency Medicine

## 2013-07-19 ENCOUNTER — Ambulatory Visit (HOSPITAL_COMMUNITY)
Admission: RE | Admit: 2013-07-19 | Discharge: 2013-07-19 | Disposition: A | Discharge status: Home / Self Care | Payer: Medicare HMO | Source: Ambulatory Visit | Attending: Emergency Medicine | Admitting: Emergency Medicine

## 2013-07-19 ENCOUNTER — Encounter (HOSPITAL_COMMUNITY): Payer: Self-pay

## 2013-07-19 ENCOUNTER — Ambulatory Visit (INDEPENDENT_AMBULATORY_CARE_PROVIDER_SITE_OTHER): Payer: Managed Care, Other (non HMO) | Admitting: Emergency Medicine

## 2013-07-19 ENCOUNTER — Ambulatory Visit: Payer: Managed Care, Other (non HMO)

## 2013-07-19 VITALS — BP 146/98 | HR 67 | Temp 98.6°F | Resp 16 | Ht 58.75 in | Wt 108.0 lb

## 2013-07-19 DIAGNOSIS — R634 Abnormal weight loss: Secondary | ICD-10-CM

## 2013-07-19 DIAGNOSIS — R109 Unspecified abdominal pain: Secondary | ICD-10-CM

## 2013-07-19 DIAGNOSIS — K429 Umbilical hernia without obstruction or gangrene: Secondary | ICD-10-CM | POA: Insufficient documentation

## 2013-07-19 DIAGNOSIS — N390 Urinary tract infection, site not specified: Secondary | ICD-10-CM

## 2013-07-19 DIAGNOSIS — K8020 Calculus of gallbladder without cholecystitis without obstruction: Secondary | ICD-10-CM | POA: Insufficient documentation

## 2013-07-19 DIAGNOSIS — K4090 Unilateral inguinal hernia, without obstruction or gangrene, not specified as recurrent: Secondary | ICD-10-CM | POA: Insufficient documentation

## 2013-07-19 LAB — POCT UA - MICROSCOPIC ONLY
Casts, Ur, LPF, POC: NEGATIVE
Crystals, Ur, HPF, POC: NEGATIVE
Mucus, UA: NEGATIVE
Yeast, UA: NEGATIVE

## 2013-07-19 LAB — POCT CBC
GRANULOCYTE PERCENT: 66.1 % (ref 37–80)
HCT, POC: 44.3 % (ref 37.7–47.9)
Hemoglobin: 13.7 g/dL (ref 12.2–16.2)
LYMPH, POC: 1.5 (ref 0.6–3.4)
MCH, POC: 30.2 pg (ref 27–31.2)
MCHC: 30.9 g/dL — AB (ref 31.8–35.4)
MCV: 97.9 fL — AB (ref 80–97)
MID (CBC): 0.4 (ref 0–0.9)
MPV: 9.1 fL (ref 0–99.8)
PLATELET COUNT, POC: 198 10*3/uL (ref 142–424)
POC GRANULOCYTE: 3.7 (ref 2–6.9)
POC LYMPH %: 26.9 % (ref 10–50)
POC MID %: 7 %M (ref 0–12)
RBC: 4.53 M/uL (ref 4.04–5.48)
RDW, POC: 14.6 %
WBC: 5.6 10*3/uL (ref 4.6–10.2)

## 2013-07-19 LAB — COMPREHENSIVE METABOLIC PANEL
ALT: 10 U/L (ref 0–35)
AST: 15 U/L (ref 0–37)
Albumin: 3.8 g/dL (ref 3.5–5.2)
Alkaline Phosphatase: 56 U/L (ref 39–117)
BUN: 10 mg/dL (ref 6–23)
CO2: 31 mEq/L (ref 19–32)
Calcium: 9.7 mg/dL (ref 8.4–10.5)
Chloride: 101 mEq/L (ref 96–112)
Creat: 0.75 mg/dL (ref 0.50–1.10)
Glucose, Bld: 74 mg/dL (ref 70–99)
Potassium: 4.2 mEq/L (ref 3.5–5.3)
Sodium: 138 mEq/L (ref 135–145)
Total Bilirubin: 0.6 mg/dL (ref 0.3–1.2)
Total Protein: 6.6 g/dL (ref 6.0–8.3)

## 2013-07-19 LAB — POCT URINALYSIS DIPSTICK
Bilirubin, UA: NEGATIVE
Glucose, UA: NEGATIVE
Ketones, UA: NEGATIVE
Nitrite, UA: NEGATIVE
Protein, UA: NEGATIVE
Spec Grav, UA: 1.01
UROBILINOGEN UA: 0.2
pH, UA: 7

## 2013-07-19 LAB — CREATININE, SERUM
CREATININE: 0.78 mg/dL (ref 0.50–1.10)
GFR calc Af Amer: >90 mL/min (ref >90)
GFR calc non Af Amer: 79 mL/min — ABNORMAL LOW (ref >90)

## 2013-07-19 LAB — IFOBT (OCCULT BLOOD): IFOBT: POSITIVE

## 2013-07-19 LAB — BUN: BUN: 9 mg/dL (ref 6–23)

## 2013-07-19 MED ORDER — CIPROFLOXACIN HCL 500 MG PO TABS: 500.0000 mg | ORAL_TABLET | Freq: Two times a day (BID) | ORAL | Status: DC

## 2013-07-19 MED ORDER — IOHEXOL 300 MG/ML  SOLN
25.0000 mL | INTRAMUSCULAR | Status: AC
  Administered 2013-07-19 (×2): 25 mL via ORAL

## 2013-07-19 MED ORDER — IOHEXOL 300 MG/ML  SOLN
80.0000 mL | Freq: Once | INTRAMUSCULAR | Status: AC | PRN
Start: 2013-07-19 — End: 2013-07-19
  Administered 2013-07-19: 80 mL via INTRAVENOUS

## 2013-07-19 NOTE — Progress Notes (Addendum)
Subjective:    Patient ID: [name], female    DOB: 1933-09-19, 80 y.o.   MRN: [id number]  HPI This chart was scribed for [name]-MD, by Ladona Ridgel Day, Scribe. This patient was seen in room 3 and the patient's care was started at 2:39 PM.  HPI Comments: [name] is a 80 y.o. female who presents to the [facility] for request to have a flu shot; she also c/o lower abdominal pain, onset 9 days ago.   She reports having normal BMs recently, she sometimes has loose stool but will use imodium w/relief. She reports that she has been feeling well and active, she works at [employer]. She reports x1 emesis episode. She denies any fever. Her last colonoscopy was in 2012.  Patient Active Problem List   Diagnosis Date Noted  . Coronary artery disease   . CAD (coronary artery disease) of artery bypass graft 04/13/2012  . Hyperlipidemia 04/13/2012  . History of CVA (cerebrovascular accident) 02/08/2012  . Personal history of subdural hematoma 02/08/2012  . Hypertension   . Diverticulosis   . Osteoporosis    Past Surgical History  Procedure Laterality Date  . Tonsillectomy and adenoidectomy    . Cesarean section      X 2  . Craniotomy  2004  . Total abdominal hysterectomy  1985    FIBROID  . Coronary stent placement  02/07/2012    LAD  . Cardiac catheterization  02/07/2012   Family History  Problem Relation Age of Onset  . Stroke Father    History   Social History  . Marital Status: Single    Spouse Name: N/A    Number of Children: N/A  . Years of Education: N/A   Occupational History  . Not on file.   Social History Main Topics  . Smoking status: Former Smoker    Types: Cigarettes    Quit date: 04/10/2003  . Smokeless tobacco: Never Used     Comment: quit 2004  . Alcohol Use: Yes     Comment: occasional  . Drug Use: No  . Sexual Activity: Not Currently    Birth Control/ Protection: Post-menopausal   Other Topics Concern  . Not on file    Social History Narrative  . No narrative on file   Allergies  Allergen Reactions  . Latex Itching  . Flagyl [Metronidazole] Rash   Results for orders placed in visit on 01/07/13  COMPREHENSIVE METABOLIC PANEL      Result Value Range   Sodium 140  135 - 145 mEq/L   Potassium 4.2  3.5 - 5.3 mEq/L   Chloride 102  96 - 112 mEq/L   CO2 28  19 - 32 mEq/L   Glucose, Bld 92  70 - 99 mg/dL   BUN 10  6 - 23 mg/dL   Creat 3.47  4.25 - 9.56 mg/dL   Total Bilirubin 0.4  0.3 - 1.2 mg/dL   Alkaline Phosphatase 81  39 - 117 U/L   AST 16  0 - 37 U/L   ALT 12  0 - 35 U/L   Total Protein 6.6  6.0 - 8.3 g/dL   Albumin 4.0  3.5 - 5.2 g/dL   Calcium 38.7  8.4 - 56.4 mg/dL  POCT CBC      Result Value Range   WBC 8.4  4.6 - 10.2 K/uL   Lymph, poc 2.1  0.6 - 3.4   POC LYMPH PERCENT 25.1  10 -  50 %L   MID (cbc) 0.6  0 - 0.9   POC MID % 7.2  0 - 12 %M   POC Granulocyte 5.7  2 - 6.9   Granulocyte percent 67.7  37 - 80 %G   RBC 4.10  4.04 - 5.48 M/uL   Hemoglobin 12.3  12.2 - 16.2 g/dL   HCT, POC 65.7  84.6 - 47.9 %   MCV 96.1  80 - 97 fL   MCH, POC 30.0  27 - 31.2 pg   MCHC 31.2 (*) 31.8 - 35.4 g/dL   RDW, POC 96.2     Platelet Count, POC 212  142 - 424 K/uL   MPV 9.9  0 - 99.8 fL  POCT SEDIMENTATION RATE      Result Value Range   POCT SED RATE 54 (*) 0 - 22 mm/hr   Review of Systems  Constitutional: Negative for fever and chills.  HENT: Negative for congestion.   Respiratory: Negative for cough and shortness of breath.   Cardiovascular: Negative for chest pain.  Gastrointestinal: Positive for vomiting and abdominal pain. Negative for diarrhea.      Objective:   Physical Exam Nursing note and vitals reviewed. Constitutional: Patient is oriented to person, place, and time. Patient appears well-developed and well-nourished. No distress.  HENT:  Head: Normocephalic and atraumatic.  Neck: Neck supple. No tracheal deviation present.  Cardiovascular: Normal rate, regular rhythm and  normal heart sounds.   No murmur heard. Pulmonary/Chest: Effort normal and breath sounds normal. No respiratory distress. Patient has no wheezes. Patient has no rales.  Musculoskeletal: Normal range of motion.  Neurological: Patient is alert and oriented to person, place, and time.  Skin: Skin is warm and dry.  Psychiatric: Patient has a normal mood and affect. Patient's behavior is normal.  ABD there is a tubular mass in the left lower abdomen. Rectal exam was also suspicious for a mass felt on deep digital exam. Triage Vitals: BP 146/98  Pulse 67  Temp(Src) 98.6 F (37 C) (Oral)  Resp 16  Ht 4' 10.75" (1.492 m)  Wt 108 lb (48.988 kg)  BMI 22.01 kg/m2  SpO2 96%  DIAGNOSTIC STUDIES: Oxygen Saturation is 96% on room air, adequate by my interpretation.   Results for orders placed in visit on 07/19/13  POCT CBC      Result Value Range   WBC 5.6  4.6 - 10.2 K/uL   Lymph, poc 1.5  0.6 - 3.4   POC LYMPH PERCENT 26.9  10 - 50 %L   MID (cbc) 0.4  0 - 0.9   POC MID % 7.0  0 - 12 %M   POC Granulocyte 3.7  2 - 6.9   Granulocyte percent 66.1  37 - 80 %G   RBC 4.53  4.04 - 5.48 M/uL   Hemoglobin 13.7  12.2 - 16.2 g/dL   HCT, POC 95.2  84.1 - 47.9 %   MCV 97.9 (*) 80 - 97 fL   MCH, POC 30.2  27 - 31.2 pg   MCHC 30.9 (*) 31.8 - 35.4 g/dL   RDW, POC 32.4     Platelet Count, POC 198  142 - 424 K/uL   MPV 9.1  0 - 99.8 fL  POCT URINALYSIS DIPSTICK      Result Value Range   Color, UA yellow     Clarity, UA cloudy     Glucose, UA neg     Bilirubin, UA neg     Ketones,  UA neg     Spec Grav, UA 1.010     Blood, UA trace-intact     pH, UA 7.0     Protein, UA neg     Urobilinogen, UA 0.2     Nitrite, UA neg     Leukocytes, UA large (3+)    POCT UA - MICROSCOPIC ONLY      Result Value Range   WBC, Ur, HPF, POC TNTC     RBC, urine, microscopic TNTC     Bacteria, U Microscopic 4+     Mucus, UA neg     Epithelial cells, urine per micros 6-15     Crystals, Ur, HPF, POC neg     Casts,  Ur, LPF, POC neg     Yeast, UA neg    IFOBT (OCCULT BLOOD)      Result Value Range   IFOBT Positive    [facility] reading (PRIMARY) by  Dr. Cleta Alberts 1 isolated air-fluid level on the right. Significant scoliosis .   COORDINATION OF CARE: At 235 PM Discussed treatment plan with patient which includes UA, abdominal X-ray, blood work. Patient agrees.      Assessment & Plan:  Patient here stating she is having some diarrhea and abdominal discomfort. She has a history of weight loss. Apparently her last sigmoidoscopy could only be performed to 20 cm. We'll schedule CT abdomen and pelvis rule out mass left lower abdomen. CT scan shows thickening of the sigmoid colon. I'm concerned she could have a mass in this area. She did have a heme positive stool. We'll treat with Cipro 500 twice a day patient referred to Dr. Loreta Ave.

## 2013-07-21 LAB — URINE CULTURE

## 2013-07-22 ENCOUNTER — Ambulatory Visit (INDEPENDENT_AMBULATORY_CARE_PROVIDER_SITE_OTHER): Payer: Managed Care, Other (non HMO) | Admitting: Emergency Medicine

## 2013-07-22 VITALS — BP 132/90 | HR 63 | Temp 98.1°F | Resp 16 | Ht 59.0 in | Wt 106.4 lb

## 2013-07-22 DIAGNOSIS — K5792 Diverticulitis of intestine, part unspecified, without perforation or abscess without bleeding: Secondary | ICD-10-CM

## 2013-07-22 DIAGNOSIS — K5732 Diverticulitis of large intestine without perforation or abscess without bleeding: Secondary | ICD-10-CM

## 2013-07-22 DIAGNOSIS — N390 Urinary tract infection, site not specified: Secondary | ICD-10-CM

## 2013-07-22 NOTE — Progress Notes (Signed)
   Subjective:    Patient ID: [name], female    DOB: 11/16/1933, 80 y.o.   MRN: [id number]  HPI 80 y.o. Female here for follow up of abdominal pain. Denies any diarrhea or nausea and is moving bowels ok. Patient states that she is feeling better. Is able to eat. Currently taking 500 mg Cipro. Has referal with Dr. Loreta AveMann pending.   Review of Systems     Objective:   Physical Exam Patient is alert and cooperative she does not appear ill. Her neck is supple. Chest is clear to auscultation and percussion. Abdomen is soft there's no tenderness in the lower abdomen       Assessment & Plan:  Patient refer to Dr. Loreta AveMann to evaluate her abnormal CT. She will need visualization of the sigmoid colon to be sure she doesn't have an underlying colon CA. She definitely  had a urinary tract infection and changes suspicious for diverticulitis. She is significantly better on Cipro

## 2013-07-25 ENCOUNTER — Telehealth: Payer: Self-pay

## 2013-07-25 NOTE — Telephone Encounter (Signed)
Patient called requesting a member of the medical staff to call her today before 5pm. Patient is leaving on a trip, and needs medication. Patient can be reached at [phone] until 5pm today, Thursday 07/25/2013.   Thank You!!!

## 2013-07-27 NOTE — Telephone Encounter (Signed)
Left message to return call 

## 2013-07-29 ENCOUNTER — Telehealth: Payer: Self-pay

## 2013-07-29 NOTE — Telephone Encounter (Signed)
Patient called stating she was told she could discontinue Rx.  Patient does not know which medicine.  I did not see a note regarding this.  Call patient at daughter's [phone], ok to speak with daughter Archie Patten(Tonya)

## 2013-07-29 NOTE — Telephone Encounter (Signed)
Did she get a phone call about discontinuing a medication? I do not see one documented?  Does she know who called and told her this.  I reviewed her chart and it looks like she was on Cipro for a possible UTI and the bacteria in her urine is sensitive to this bacteria.

## 2013-07-30 NOTE — Telephone Encounter (Signed)
Spoke to daughter. Pt may have gotten a call from a different dr. She is going to let us know if there are any changes to her medication.

## 2013-07-31 NOTE — Telephone Encounter (Signed)
LM for pt to rtn call if necessary.

## 2013-08-07 ENCOUNTER — Encounter: Payer: Self-pay | Admitting: Internal Medicine

## 2013-08-13 ENCOUNTER — Telehealth: Payer: Self-pay

## 2013-08-13 DIAGNOSIS — N390 Urinary tract infection, site not specified: Secondary | ICD-10-CM

## 2013-08-13 NOTE — Telephone Encounter (Signed)
Patient called requesting refill for her Citrofloxacin (500mg ). Patient out of meds for five days.     Thank You!!!

## 2013-08-15 MED ORDER — CIPROFLOXACIN HCL 500 MG PO TABS: 500.0000 mg | ORAL_TABLET | Freq: Two times a day (BID) | ORAL | Status: DC

## 2013-08-15 NOTE — Telephone Encounter (Signed)
Go ahead and refill her Cipro. She was supposed to see Dr. Loreta AveMann the GI specialist but I tried to call and leave a message but will have to wait her reply

## 2013-08-15 NOTE — Telephone Encounter (Signed)
Sent RF and notified pt on VM. Asked for CB w/plans for f/up w/Dr Loreta AveMann so we can let Dr Cleta Albertsaub know.

## 2013-08-15 NOTE — Telephone Encounter (Signed)
Dr Cleta Albertsaub, I denied RF of this yesterday because we normally do not RF Abxs and it looks like you Rxd for an acute problem. Do you normally Rx this for continuing therapy for pt?

## 2013-08-16 NOTE — Telephone Encounter (Signed)
Dr Daub, FYI 

## 2013-08-16 NOTE — Telephone Encounter (Signed)
Ms. Chestine SporeClark returned call with f/u plan. She will be seeing Dr. Leone PayorGessner on 09/09/13 at 10 am.

## 2013-08-26 ENCOUNTER — Telehealth: Payer: Self-pay

## 2013-08-26 DIAGNOSIS — N390 Urinary tract infection, site not specified: Secondary | ICD-10-CM

## 2013-08-26 NOTE — Telephone Encounter (Signed)
Lm for rtn call 

## 2013-08-26 NOTE — Telephone Encounter (Signed)
PT STATES SHE WOULD LIKE TO HAVE SOME ANTIBIOTICS CALLED IN FOR HER. PLEASE CALL [phone]    [facility] ON [address]

## 2013-08-27 MED ORDER — CIPROFLOXACIN HCL 500 MG PO TABS
500.0000 mg | ORAL_TABLET | Freq: Two times a day (BID) | ORAL | Status: DC
Start: 2013-08-27 — End: 2013-09-09

## 2013-08-27 MED ORDER — CIPROFLOXACIN HCL 500 MG PO TABS: 500.0000 mg | ORAL_TABLET | Freq: Two times a day (BID) | ORAL | Status: DC

## 2013-08-27 NOTE — Telephone Encounter (Signed)
Pt CB and I advised her another RF was sent for Cipro to cover her until GI appt, but that Dr Cleta Albertsaub wants to check her this week tomorrow or Thurs to make sure she is not forming an abscess. Pt agreed to come in and reported that she was able to get her ins to approve her using the [facility] instead of having to [facility] to [facility]. I have updated her profile to delete the [facility].

## 2013-08-27 NOTE — Telephone Encounter (Signed)
Please refill her Cipro. Pelvic Have patient come in to see me either tomorrow morning.

## 2013-08-27 NOTE — Telephone Encounter (Signed)
LMOM for CB. Pt got Cipro on 08/15/13 for 10 day course for diverticulitis. Her appt w/GI is not until 09/09/13. I'm sure this is probably the Abx she is requesting. Dr Cleta Albertsaub, do you want to Rx enough to keep pt on this until her appt? Pended for review.

## 2013-08-27 NOTE — Telephone Encounter (Signed)
I discussed the issue with Britta MccreedyBarbara. I do want to see Claris CheMargaret prior to her GI appointment which is not until March 2. I need to be sure she's not forming a diverticular abscess.

## 2013-08-27 NOTE — Telephone Encounter (Signed)
[facility] to discuss Dr Cleta Albertsaub needing to see her.

## 2013-08-29 ENCOUNTER — Ambulatory Visit (INDEPENDENT_AMBULATORY_CARE_PROVIDER_SITE_OTHER): Payer: Managed Care, Other (non HMO) | Admitting: Emergency Medicine

## 2013-08-29 VITALS — BP 186/102 | HR 72 | Temp 98.3°F | Resp 16 | Ht <= 58 in | Wt 109.4 lb

## 2013-08-29 DIAGNOSIS — N390 Urinary tract infection, site not specified: Secondary | ICD-10-CM

## 2013-08-29 DIAGNOSIS — I10 Essential (primary) hypertension: Secondary | ICD-10-CM

## 2013-08-29 DIAGNOSIS — R109 Unspecified abdominal pain: Secondary | ICD-10-CM

## 2013-08-29 LAB — POCT CBC
Granulocyte percent: 64.5 %G (ref 37–80)
HCT, POC: 48.7 % — AB (ref 37.7–47.9)
HEMOGLOBIN: 15.2 g/dL (ref 12.2–16.2)
LYMPH, POC: 1.3 (ref 0.6–3.4)
MCH, POC: 30.9 pg (ref 27–31.2)
MCHC: 31.2 g/dL — AB (ref 31.8–35.4)
MCV: 99 fL — AB (ref 80–97)
MID (CBC): 0.4 (ref 0–0.9)
MPV: 11 fL (ref 0–99.8)
PLATELET COUNT, POC: 164 10*3/uL (ref 142–424)
POC GRANULOCYTE: 3.2 (ref 2–6.9)
POC LYMPH PERCENT: 26.6 %L (ref 10–50)
POC MID %: 8.9 % (ref 0–12)
RBC: 4.92 M/uL (ref 4.04–5.48)
RDW, POC: 15.3 %
WBC: 5 10*3/uL (ref 4.6–10.2)

## 2013-08-29 NOTE — Addendum Note (Signed)
Addended by: Johnnette LitterARDWELL, Antwine Agosto M on: 08/29/2013 03:43 PM   Modules accepted: Orders

## 2013-08-29 NOTE — Progress Notes (Addendum)
Subjective:    Patient ID: [name], female    DOB: 1934/01/01, 80 y.o.   MRN: [id number] This chart was scribed for Jill Chris, MD by [name], ED Scribe. This patient was seen in room 9 and the patient's care was started at 1:17 PM.  Chief Complaint  Patient presents with  . Follow-up    having colonoscopy 3/2  . Medication Refill    cipro    HPI HPI Comments: [name] is a 80 y.o. female with a h/o HTN, CVA, CAD who presents to the [facility] for a follow up and medication refill on the cipro. She states she has only had one episode of abdominal pain since she was here last and is not on any antibiotics currently. She states she has been feeling good. She states she is having a colonoscopy on 3/2. She has had 2 colonoscopies with Dr [name]. She has not seen the GI doctor. She denies weight loss. She lives at home with her son.   PCP - Jill Edin, MD  Past Medical History  Diagnosis Date  . Hypertension   . CVA (cerebral vascular accident)     Right brain CVA 04/2011 - carotid dopplers showed no significant stenosis (tortuous ICA).   . Diverticulosis   . Subdural hematoma     2004 s/p craniotomy  . Pyelonephritis 2006  . Abnormal CT of the abdomen     Nodes  resolved  . Inguinal hernia     Right, seen on CT 2008  . Diverticulosis   . Osteoporosis   . Coronary artery disease     a. [place] s/p BMS to prox LAD (with residual moderate mid-LAD & mid-RCA dz for medical therapy) 01/2012 - EF 40% at that time by cath.  . Arthritis   . LV dysfunction     EF 40% by cath 01/2012 with WMA, but 50% by f/u echo same admission   Current Outpatient Prescriptions on File Prior to Visit  Medication Sig Dispense Refill  . alum & mag hydroxide-simeth (MAALOX/MYLANTA) 200-200-20 MG/5ML suspension Take 15 mLs by mouth every 6 (six) hours as needed. As needed for gas.      Marland Kitchen aspirin 81 MG tablet Take 81 mg by mouth daily.      Marland Kitchen atorvastatin (LIPITOR) 80  MG tablet Take 1 tablet (80 mg total) by mouth daily.  90 tablet  1  . CALCIUM PO Take 600 Units by mouth 2 (two) times daily.       . carvedilol (COREG) 6.25 MG tablet Take 1 tablet (6.25 mg total) by mouth 2 (two) times daily with a meal.  180 tablet  1  . cetirizine (ZYRTEC) 10 MG tablet Take 10 mg by mouth daily as needed.      . ciprofloxacin (CIPRO) 500 MG tablet Take 1 tablet (500 mg total) by mouth 2 (two) times daily.  28 tablet  0  . fish oil-omega-3 fatty acids 1000 MG capsule Take 2 g by mouth 2 (two) times daily.       . hydrochlorothiazide (MICROZIDE) 12.5 MG capsule Take 1 capsule (12.5 mg total) by mouth every morning.  90 capsule  1  . lisinopril (PRINIVIL,ZESTRIL) 5 MG tablet Take 1 tablet (5 mg total) by mouth daily.  90 tablet  1  . loperamide (IMODIUM A-D) 2 MG tablet Take 2 mg by mouth 4 (four) times daily as needed. As needed for loose stools.      Marland Kitchen  naphazoline-glycerin (CLEAR EYES) 0.012-0.2 % SOLN Place 1 drop into both eyes every 4 (four) hours as needed. For irritated eyes      . NITROSTAT 0.4 MG SL tablet place 1 tablet under the tongue every 5 minutes if needed for chest pain  25 tablet  12  . psyllium (METAMUCIL) 58.6 % powder Take 1 packet by mouth daily as needed.        No current facility-administered medications on file prior to visit.   Allergies  Allergen Reactions  . Latex Itching  . Flagyl [Metronidazole] Rash    Review of Systems     Objective:   Physical Exam CONSTITUTIONAL: Well developed/well nourished HEAD: Normocephalic/atraumatic EYES: EOMI/PERRL ENMT: Mucous membranes moist NECK: supple no meningeal signs SPINE:entire spine nontender CV: S1/S2 noted, no murmurs/rubs/gallops noted LUNGS: Lungs are clear to auscultation bilaterally, no apparent distress ABDOMEN: soft, nontender, no rebound or guarding GU:no cva tenderness NEURO: Pt is awake/alert, moves all extremitiesx4 EXTREMITIES: pulses normal, full ROM SKIN: warm, color  normal PSYCH: no abnormalities of mood noted   Filed Vitals:   08/29/13 1323  BP: 186/102  Pulse: 72  Temp: 98.3 F (36.8 C)  TempSrc: Oral  Resp: 16  Height: 4\' 10"  (1.473 m)  Weight: 109 lb 6.4 oz (49.624 kg)    Wt Readings from Last 3 Encounters:  08/29/13 109 lb 6.4 oz (49.624 kg)  07/22/13 106 lb 6.4 oz (48.263 kg)  07/19/13 108 lb (48.988 kg)    Results for orders placed in visit on 08/29/13  POCT CBC      Result Value Ref Range   WBC 5.0  4.6 - 10.2 K/uL   Lymph, poc 1.3  0.6 - 3.4   POC LYMPH PERCENT 26.6  10 - 50 %L   MID (cbc) 0.4  0 - 0.9   POC MID % 8.9  0 - 12 %M   POC Granulocyte 3.2  2 - 6.9   Granulocyte percent 64.5  37 - 80 %G   RBC 4.92  4.04 - 5.48 M/uL   Hemoglobin 15.2  12.2 - 16.2 g/dL   HCT, POC 10.2 (*) 72.5 - 47.9 %   MCV 99.0 (*) 80 - 97 fL   MCH, POC 30.9  27 - 31.2 pg   MCHC 31.2 (*) 31.8 - 35.4 g/dL   RDW, POC 36.6     Platelet Count, POC 164  142 - 424 K/uL   MPV 11.0  0 - 99.8 fL      Assessment & Plan:   White count is 5000 I do not see an indication for further Cipro at the present time. I am concerned about the left lower quadrant and possibility of a tubular mass in this area. Her blood pressure is not under control. I advised her to get the pill containers so she is sure she takes her medicine regular and increased her lisinopril from 5 mg to 10 mg a day. **Disclaimer: This note was dictated with voice recognition software. Similar sounding words can inadvertently be transcribed and this note may contain transcription errors which may not have been corrected upon publication of note.**    I personally performed the services described in this documentation, which was scribed in my presence. The recorded information has been reviewed and is accurate.

## 2013-09-02 ENCOUNTER — Telehealth: Payer: Self-pay

## 2013-09-02 NOTE — Telephone Encounter (Signed)
Billy FischerClark, Toleen A - 08/29/13 ','<More Detail >>       Iva Booparl E Gessner, MD       Sent: Caleen EssexFri August 30, 2013 12:18 PM    To: Rossie MuskratSheri Lynn Jones, RN                   Message     I have reviewed her records and see that she is being referred because of abnormal sigmoid colon on CT.        She could have a direct colonoscopy for that if desired - please call her and see if she would like to do it that way vs. OV on 3/2 and colonoscopy.        Could also keep 3/2 OV and get her a colonoscopy date now.        CEG        ----- Message -----    From: Collene GobbleSteven A Daub, MD    Sent: 08/29/2013 3:09 PM    To: Iva Booparl E Gessner, MD        I am concerned about a tubular mass like area in the left lower quadrant. She's had previous CT           Left message for patient to call back

## 2013-09-03 NOTE — Telephone Encounter (Signed)
Left message for patient to call back  

## 2013-09-04 ENCOUNTER — Encounter: Payer: Self-pay | Admitting: *Deleted

## 2013-09-06 NOTE — Telephone Encounter (Signed)
Patient has not returned any calls.  Her appt is scheduled for Monday

## 2013-09-09 ENCOUNTER — Ambulatory Visit (INDEPENDENT_AMBULATORY_CARE_PROVIDER_SITE_OTHER): Payer: Managed Care, Other (non HMO) | Admitting: Internal Medicine

## 2013-09-09 ENCOUNTER — Encounter: Payer: Self-pay | Admitting: Internal Medicine

## 2013-09-09 VITALS — BP 132/64 | HR 60 | Ht <= 58 in | Wt 110.0 lb

## 2013-09-09 DIAGNOSIS — K5732 Diverticulitis of large intestine without perforation or abscess without bleeding: Secondary | ICD-10-CM

## 2013-09-09 DIAGNOSIS — Z1211 Encounter for screening for malignant neoplasm of colon: Secondary | ICD-10-CM

## 2013-09-09 MED ORDER — NA SULFATE-K SULFATE-MG SULF 17.5-3.13-1.6 GM/177ML PO SOLN: ORAL | Status: DC

## 2013-09-09 NOTE — Progress Notes (Signed)
Subjective:    Patient ID: Jill FischerMargaret A Chapman, female    DOB: 06/16/1934, 80 y.o.   MRN: [id number]  HPI Patient is a very nice elderly woman here for evaluation at the request of Dr. Cleta Albertsaub. She was seen recently with left lower quadrant pain and a CT scan suggested diverticulitis. She has known severe diverticulosis. She was treated with ciprofloxacin and had resolution of her symptoms of left lower quadrant pain. She was having a little loose bowel movement issue at that time as well. She describes some fecal incontinence on 2 occasions. She previously had a colonoscopy attempt which was unsuccessful, but was completed with a CT colonoscopy. The CT colonoscopy described some diminutive rectal polyps. The attempted colonoscopy, this was in May of 2006, showed passage to the mid sigmoid only. She has not had colonoscopy since. Allergies  Allergen Reactions  . Latex Itching  . Flagyl [Metronidazole] Rash   Outpatient Prescriptions Prior to Visit  Medication Sig Dispense Refill  . alum & mag hydroxide-simeth (MAALOX/MYLANTA) 200-200-20 MG/5ML suspension Take 15 mLs by mouth every 6 (six) hours as needed. As needed for gas.      Marland Kitchen. aspirin 81 MG tablet Take 81 mg by mouth daily.      Marland Kitchen. atorvastatin (LIPITOR) 80 MG tablet Take 1 tablet (80 mg total) by mouth daily.  90 tablet  1  . CALCIUM PO Take 600 Units by mouth 2 (two) times daily.       . carvedilol (COREG) 6.25 MG tablet Take 1 tablet (6.25 mg total) by mouth 2 (two) times daily with a meal.  180 tablet  1  . cetirizine (ZYRTEC) 10 MG tablet Take 10 mg by mouth daily as needed.      . fish oil-omega-3 fatty acids 1000 MG capsule Take 2 g by mouth 2 (two) times daily.       . hydrochlorothiazide (MICROZIDE) 12.5 MG capsule Take 1 capsule (12.5 mg total) by mouth every morning.  90 capsule  1  . lisinopril (PRINIVIL,ZESTRIL) 5 MG tablet Take 1 tablet (5 mg total) by mouth daily.  90 tablet  1  . loperamide (IMODIUM A-D) 2 MG tablet Take 2  mg by mouth 4 (four) times daily as needed. As needed for loose stools.      . naphazoline-glycerin (CLEAR EYES) 0.012-0.2 % SOLN Place 1 drop into both eyes every 4 (four) hours as needed. For irritated eyes      . NITROSTAT 0.4 MG SL tablet place 1 tablet under the tongue every 5 minutes if needed for chest pain  25 tablet  12  . psyllium (METAMUCIL) 58.6 % powder Take 1 packet by mouth daily as needed.       . ciprofloxacin (CIPRO) 500 MG tablet Take 1 tablet (500 mg total) by mouth 2 (two) times daily.  28 tablet  0   No facility-administered medications prior to visit.   Past Medical History  Diagnosis Date  . Hypertension   . CVA (cerebral vascular accident)     Right brain CVA 04/2011 - carotid dopplers showed no significant stenosis (tortuous ICA).   . Diverticulosis   . Subdural hematoma     2004 s/p craniotomy  . Pyelonephritis 2006  . Abnormal CT of the abdomen     Nodes  resolved  . Inguinal hernia     Right, seen on CT 2008  . Osteoporosis   . Coronary artery disease  a. [place] s/p BMS to prox LAD (with residual moderate mid-LAD & mid-RCA dz for medical therapy) 01/2012 - EF 40% at that time by cath.  . Arthritis   . LV dysfunction     EF 40% by cath 01/2012 with WMA, but 50% by f/u echo same admission   Past Surgical History  Procedure Laterality Date  . Tonsillectomy and adenoidectomy    . Cesarean section      X 2  . Craniotomy  2004  . Total abdominal hysterectomy  1985    FIBROID  . Coronary stent placement  02/07/2012    LAD  . Cardiac catheterization  02/07/2012  . Flexible sigmoidoscopy  11/24/2004    Dr. Mann--Diverticulosis    History   Social History  . Marital Status: Single    Spouse Name: N/A    Number of Children: N/A  . Years of Education: N/A   Social History Main Topics  . Smoking status: Former Smoker    Types: Cigarettes    Quit date: 04/10/2003  . Smokeless tobacco: Never Used     Comment: quit 2004  . Alcohol Use: Yes      Comment: occasional  . Drug Use: No  . Sexual Activity: Not Currently    Birth Control/ Protection: Post-menopausal   Other Topics Concern  . 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 She is also work at [employer].   2 grown children, a daughter who lives in [place] area and a son who lives with the patient.   Family History  Problem Relation Age of Onset  . Stroke Father     Review of Systems Urine leaks intermittently without warning and is not necessarily associated with stress, small volume. She has not yet discussed this with her primary care provider. She has some joint pain and says she is cold nature and is ready for winter to be over. All other review of systems  negative.    Objective:   Physical Exam General:  Well-developed, well-nourished and in no acute distress - petite Eyes:  anicteric. ENT:   Mouth and posterior pharynx free of lesions. + upper dentures, few remaining lower teeth poor repair (lower plate broken) Neck:   supple w/o thyromegaly or mass.  Lungs: Clear to auscultation bilaterally. Heart:  S1S2, no rubs, murmurs, gallops. Abdomen:  soft, non-tender, no hepatosplenomegaly, or mass and BS+. Low midline scar - + small reducible umbilical hernia and slight right inguinal hernia - reducible Rectal: deferred Lymph:  no cervical or supraclavicular adenopathy. Extremities:   no edema Skin   no rash. Neuro:  A&O x 3.  Psych:  appropriate mood and  Affect.   Data Reviewed: CT scan Labs  PCP notes    Assessment & Plan:   1. Diverticulitis of colon (without mention of hemorrhage)   2. Special screening for malignant neoplasms, colon    Her diverticulitis seems resolved. I think an attempted a screening colonoscopy is reasonable. Will either try pediatric colonoscope or perhaps just start with an upper endoscope which might make sense. She is a small lady I think I get the colonoscopy done  with that.The risks and benefits as well as alternatives of endoscopic procedure(s) have been discussed and reviewed. All questions answered. The patient agrees to proceed.

## 2013-09-09 NOTE — Patient Instructions (Signed)
You have been scheduled for a colonoscopy with propofol. Please follow written instructions given to you at your visit today.  Please pick up your prep kit at the pharmacy within the next 1-3 days. If you use inhalers (even only as needed), please bring them with you on the day of your procedure.  I appreciate the opportunity to care for you.   

## 2013-09-12 ENCOUNTER — Encounter: Payer: Self-pay | Admitting: Internal Medicine

## 2013-10-24 ENCOUNTER — Other Ambulatory Visit: Payer: Self-pay | Admitting: *Deleted

## 2013-10-24 ENCOUNTER — Telehealth: Payer: Self-pay

## 2013-10-24 NOTE — Telephone Encounter (Signed)
LMVM for patient to return call. 

## 2013-10-24 NOTE — Telephone Encounter (Signed)
We would not want to put her on medication for this issue. If she is having any burning or stinging when she pees she would need to come in and us check her urine be sure there is no infection

## 2013-10-24 NOTE — Telephone Encounter (Signed)
Pt states she is having a colonoscopy on 10/30/13 and has to drink a lot of water for that test, howeveer she is having bladder leakage issues and wants to know how that can be controled with the up coming procedure Please call pt to advise

## 2013-10-24 NOTE — Telephone Encounter (Signed)
Pt reports she is having a problem with urine control every day- she is not comfortable with drinking that much water. She uses poise and centry pads/panty liners. But she has to change them multiple times a day. She is wondering if there is any other suggestions we have to help her control this situation.

## 2013-10-25 NOTE — Telephone Encounter (Signed)
Spoke with patient.  Advised her that Dr. Cleta Albertsaub does not want to put her on any medication at this time.  Is she having burning or stinging when she pees?  Patient denies.  States that she will take her colonoscopy on the 22nd and hopefully everything will return to normal.  Advised patient to follow up with Dr. Cleta Albertsaub if symptoms persist.

## 2013-10-31 ENCOUNTER — Ambulatory Visit (AMBULATORY_SURGERY_CENTER): Payer: Managed Care, Other (non HMO) | Admitting: Internal Medicine

## 2013-10-31 ENCOUNTER — Other Ambulatory Visit: Payer: Self-pay | Admitting: Internal Medicine

## 2013-10-31 ENCOUNTER — Telehealth: Payer: Self-pay | Admitting: Internal Medicine

## 2013-10-31 ENCOUNTER — Encounter: Payer: Self-pay | Admitting: Internal Medicine

## 2013-10-31 VITALS — BP 168/91 | HR 66 | Temp 97.3°F | Resp 23 | Ht <= 58 in | Wt 110.0 lb

## 2013-10-31 DIAGNOSIS — K5790 Diverticulosis of intestine, part unspecified, without perforation or abscess without bleeding: Secondary | ICD-10-CM

## 2013-10-31 DIAGNOSIS — Z1211 Encounter for screening for malignant neoplasm of colon: Secondary | ICD-10-CM

## 2013-10-31 DIAGNOSIS — K5730 Diverticulosis of large intestine without perforation or abscess without bleeding: Secondary | ICD-10-CM

## 2013-10-31 MED ORDER — SODIUM CHLORIDE 0.9 % IV SOLN: 500.0000 mL | INTRAVENOUS | Status: DC

## 2013-10-31 MED ORDER — FLEET ENEMA 7-19 GM/118ML RE ENEM
1.0000 | ENEMA | Freq: Once | RECTAL | Status: AC
  Administered 2013-10-31: 1 via RECTAL

## 2013-10-31 NOTE — Progress Notes (Addendum)
Pt bp was elevated upon arrival to admitting, BP was 175/116, pt did not take bp meds this am, pt states she fells "off balance when stepping", Dr Leone PayorGessner is aware of elevated BP and advised he will get her labetalol if needed during the procedure. Pt's daughter is concerned about BP and wants to speak with Dr. Leone PayorGessner, Dr Leone PayorGessner spoke with pt and daughter about BP and procedure-cm

## 2013-10-31 NOTE — Patient Instructions (Addendum)
I was only able to see part of the colon. The diverticulosis (thickened muscles and pockets) made passing the scope all the way impossible and put you at risk of having damage so I stopped. I tried 2 different scopes.  I would not do anything else at this time.  I appreciate the opportunity to care for you. Iva Booparl E. Martita Brumm, MD, FACG  YOU HAD AN ENDOSCOPIC PROCEDURE TODAY AT THE [facility]: Refer to the procedure report that was given to you for any specific questions about what was found during the examination.  If the procedure report does not answer your questions, please call your gastroenterologist to clarify.  If you requested that your care partner not be given the details of your procedure findings, then the procedure report has been included in a sealed envelope for you to review at your convenience later.  YOU SHOULD EXPECT: Some feelings of bloating in the abdomen. Passage of more gas than usual.  Walking can help get rid of the air that was put into your GI tract during the procedure and reduce the bloating. If you had a lower endoscopy (such as a colonoscopy or flexible sigmoidoscopy) you may notice spotting of blood in your stool or on the toilet paper. If you underwent a bowel prep for your procedure, then you may not have a normal bowel movement for a few days.  DIET: Your first meal following the procedure should be a light meal and then it is ok to progress to your normal diet.  A half-sandwich or bowl of soup is an example of a good first meal.  Heavy or fried foods are harder to digest and may make you feel nauseous or bloated.  Likewise meals heavy in dairy and vegetables can cause extra gas to form and this can also increase the bloating.  Drink plenty of fluids but you should avoid alcoholic beverages for 24 hours.  ACTIVITY: Your care partner should take you home directly after the procedure.  You should plan to take it easy, moving slowly for the rest of the day.   You can resume normal activity the day after the procedure however you should NOT DRIVE or use heavy machinery for 24 hours (because of the sedation medicines used during the test).    SYMPTOMS TO REPORT IMMEDIATELY: A gastroenterologist can be reached at any hour.  During normal business hours, 8:30 AM to 5:00 PM Monday through Friday, call [phone].  After hours and on weekends, please call the GI answering service at [phone] who will take a message and have the physician on call contact you.   Following lower endoscopy (colonoscopy or flexible sigmoidoscopy):  Excessive amounts of blood in the stool  Significant tenderness or worsening of abdominal pains  Swelling of the abdomen that is new, acute  Fever of 100F or higher  FOLLOW UP: If any biopsies were taken you will be contacted by phone or by letter within the next 1-3 weeks.  Call your gastroenterologist if you have not heard about the biopsies in 3 weeks.  Our staff will call the home number listed on your records the next business day following your procedure to check on you and address any questions or concerns that you may have at that time regarding the information given to you following your procedure. This is a courtesy call and so if there is no answer at the home number and we have not heard from you through the emergency physician on  call, we will assume that you have returned to your regular daily activities without incident.  SIGNATURES/CONFIDENTIALITY: You and/or your care partner have signed paperwork which will be entered into your electronic medical record.  These signatures attest to the fact that that the information above on your After Visit Summary has been reviewed and is understood.  Full responsibility of the confidentiality of this discharge information lies with you and/or your care-partner.

## 2013-10-31 NOTE — Telephone Encounter (Signed)
Spoke with patient. She states she took both suprep bottles together last night at once with water, she states she miss understood instructions. Is having BM's, last Bm was liquid with some pieces of stool. Notified Dr.Gessner. He recommended patient take miralax 2 doses OTC if she has this at home, if not push fluids and [facility]. Explained this to patient. She states she does not have any laxatives at home. Encouraged patient to drink a lot of fluids but stop by 2 pm. She verbalizes understanding. RM

## 2013-10-31 NOTE — Op Note (Signed)
[facility] [address] [place], [id number]   COLONOSCOPY PROCEDURE REPORT  PATIENT: [name], Jill A.  MR#: [id number] BIRTHDATE: 12/10/1933 , 80  yrs. old GENDER: Female ENDOSCOPIST: Iva Booparl E Gessner, MD, [facility] REFERRED XB:JYNWGNBY:Steven Cleta Albertsaub, M.D. PROCEDURE DATE:  10/31/2013 PROCEDURE:   Colonoscopy, screening - incomplete First Screening Colonoscopy - Avg.  risk and is 50 yrs.  old or older - No.  Prior Negative Screening - Now for repeat screening. 10 or more years since last screening  History of Adenoma - Now for follow-up colonoscopy & has been > or = to 3 yrs.  N/A  Polyps Removed Today? No.  Recommend repeat exam, <10 yrs? No. ASA CLASS:   Class III INDICATIONS:average risk screening. MEDICATIONS: propofol (Diprivan) 200mg  IV, MAC sedation, administered by CRNA, and These medications were titrated to patient response per physician's verbal order  DESCRIPTION OF PROCEDURE:   After the risks benefits and alternatives of the procedure were thoroughly explained, informed consent was obtained.  A digital rectal exam revealed no abnormalities of the rectum.   The     endoscope was introduced through the anus and advanced to the sigmoid colon. No adverse events experienced.   Limited by a tortuous colon.   The quality of the prep was Suprep good  The instrument was then slowly withdrawn as the colon was fully examined.      COLON FINDINGS: There was severe diverticulosis noted in the sigmoid colon with associated angulation and colonic narrowing. Retroflexed views revealed internal hemorrhoids. The time to cecum=not taken.  Withdrawal time=not taken     .  The scope was withdrawn and the procedure completed. COMPLICATIONS: There were no complications.  ENDOSCOPIC IMPRESSION: There was severe diverticulosis noted in the sigmoid colon - this prevented passage of a pediatric colonoscope and a gastroscope. Hemorrhoids in rectum.  RECOMMENDATIONS: Follow-up as  needed - would not pursue other imaging/procedures at this time.   eSigned:  Iva Booparl E Gessner, MD, [facility] 10/31/2013 4:28 PM   cc: Lesle ChrisSteven Daub, MD and The Patient

## 2013-11-01 ENCOUNTER — Telehealth: Payer: Self-pay | Admitting: *Deleted

## 2013-11-01 NOTE — Telephone Encounter (Signed)
LMOM.  Patient identifier.  Advised patient to call back if any questions or concerns. 

## 2013-11-25 ENCOUNTER — Ambulatory Visit: Payer: Managed Care, Other (non HMO) | Admitting: Cardiology

## 2013-11-28 ENCOUNTER — Ambulatory Visit: Payer: Managed Care, Other (non HMO) | Admitting: Cardiology

## 2013-12-03 ENCOUNTER — Encounter: Payer: Self-pay | Admitting: Cardiology

## 2014-01-02 ENCOUNTER — Encounter: Payer: Self-pay | Admitting: Cardiology

## 2014-01-13 ENCOUNTER — Ambulatory Visit (INDEPENDENT_AMBULATORY_CARE_PROVIDER_SITE_OTHER): Payer: Managed Care, Other (non HMO) | Admitting: Cardiology

## 2014-01-13 ENCOUNTER — Ambulatory Visit: Payer: Managed Care, Other (non HMO) | Admitting: Cardiology

## 2014-01-13 ENCOUNTER — Encounter: Payer: Self-pay | Admitting: Cardiology

## 2014-01-13 VITALS — BP 156/98 | HR 71 | Ht <= 58 in | Wt 113.0 lb

## 2014-01-13 DIAGNOSIS — I2582 Chronic total occlusion of coronary artery: Secondary | ICD-10-CM

## 2014-01-13 DIAGNOSIS — Z79899 Other long term (current) drug therapy: Secondary | ICD-10-CM

## 2014-01-13 DIAGNOSIS — I2510 Atherosclerotic heart disease of native coronary artery without angina pectoris: Secondary | ICD-10-CM

## 2014-01-13 DIAGNOSIS — E785 Hyperlipidemia, unspecified: Secondary | ICD-10-CM

## 2014-01-13 DIAGNOSIS — I10 Essential (primary) hypertension: Secondary | ICD-10-CM

## 2014-01-13 NOTE — Progress Notes (Signed)
01/15/2014   PCP: Lucilla Chapman, Jill A, MD   Chief Complaint  Patient presents with  . Follow-up    [name]; 6 month visit    Primary Cardiologist:Dr. P. Jill   HPI  Patient presents to clinic for 6 month F/U.  History of bare metal stent to proximal LAD 2013,  Last EF 50%.  Denies chest pain/SOB.  Continues to C/O tingling sensations in middle fingers, bilaterally, otherwise doing well.  Medications reviewed.  States she is not taking Lipitor.  BP elevated  but patient states she did not take her coreg this AM.    Allergies  Allergen Reactions  . Latex Itching  . Flagyl [Metronidazole] Rash    Current Outpatient Prescriptions  Medication Sig Dispense Refill  . alum & mag hydroxide-simeth (MAALOX/MYLANTA) 200-200-20 MG/5ML suspension Take 15 mLs by mouth every 6 (six) hours as needed. As needed for gas.      Marland Kitchen. aspirin 81 MG tablet Take 81 mg by mouth daily.      . carvedilol (COREG) 6.25 MG tablet Take 1 tablet (6.25 mg total) by mouth 2 (two) times daily with a meal.  180 tablet  1  . cetirizine (ZYRTEC) 10 MG tablet Take 10 mg by mouth daily as needed.      . fish oil-omega-3 fatty acids 1000 MG capsule Take 2 g by mouth 2 (two) times daily.       . hydrochlorothiazide (MICROZIDE) 12.5 MG capsule Take 1 capsule (12.5 mg total) by mouth every morning.  90 capsule  1  . loperamide (IMODIUM A-D) 2 MG tablet Take 2 mg by mouth 4 (four) times daily as needed. As needed for loose stools.      . naphazoline-glycerin (CLEAR EYES) 0.012-0.2 % SOLN Place 1 drop into both eyes every 4 (four) hours as needed. For irritated eyes      . NITROSTAT 0.4 MG SL tablet place 1 tablet under the tongue every 5 minutes if needed for chest pain  25 tablet  12  . psyllium (METAMUCIL) 58.6 % powder Take 1 packet by mouth daily as needed.        No current facility-administered medications for this visit.    Past Medical History  Diagnosis Date  . Hypertension   . CVA (cerebral  vascular accident)     Right brain CVA 04/2011 - carotid dopplers showed no significant stenosis (tortuous ICA).   . Diverticulosis   . Subdural hematoma     2004 s/p craniotomy  . Pyelonephritis 2006  . Abnormal CT of the abdomen     Nodes  resolved  . Inguinal hernia     Right, seen on CT 2008  . Osteoporosis   . Coronary artery disease     a. [place] s/[place] BMS to prox LAD (with residual moderate mid-LAD & mid-RCA dz for medical therapy) 01/2012 - EF 40% at that time by cath.  . Arthritis   . LV dysfunction     EF 40% by cath 01/2012 with WMA, but 50% by f/u echo same admission    Past Surgical History  Procedure Laterality Date  . Tonsillectomy and adenoidectomy    . Cesarean section      X 2  . Craniotomy  2004  . Total abdominal hysterectomy  1985    FIBROID  . Coronary stent placement  02/07/2012    LAD  . Cardiac catheterization  02/07/2012  . Flexible sigmoidoscopy  11/24/2004  Dr. Mann--Diverticulosis     MWU:XLKGMWN:UUROS:General:no colds or fevers, minimal weight changes Skin:no rashes or ulcers HEENT:no blurred vision, no congestion CV:see HPI PUL:see HPI GI:no diarrhea constipation or melena, no indigestion GU:no hematuria, no dysuria MS:no joint pain, no claudication Neuro:no syncope, no lightheadedness Endo:no diabetes, no thyroid disease  Wt Readings from Last 3 Encounters:  01/13/14 113 lb (51.256 kg)  10/31/13 110 lb (49.896 kg)  09/09/13 110 lb (49.896 kg)    PHYSICAL EXAM BP 156/98  Pulse 71  Ht 4\' 9"  (1.448 m)  Wt 113 lb (51.256 kg)  BMI 24.45 kg/m2 General:Pleasant affect, NAD Skin:Warm and dry, brisk capillary refill HEENT:normocephalic, sclera clear, mucus membranes moist Neck:supple, no JVD, no bruits  Heart:S1S2 RRR without murmur, gallup, rub or click Lungs:clear without rales, rhonchi, or wheezes VOZ:DGUYAbd:soft, non tender, + BS, do not palpate liver spleen or masses Ext:L lower ext trace edema, 2+ pedal pulses, 2+ radial pulses Neuro:alert and oriented  X3 , MAE, follows commands, + facial symmetry  EKG: SR without ST segment/T wave abnormalities ASSESSMENT AND PLAN Coronary artery disease  [place] s/p BMS to prox LAD (with residual moderate mid-LAD & mid-RCA dz for medical therapy) 01/2012 - EF 40% at that time by cath EKG stable, no chest pain  Hyperlipidemia Now off Lipitor, may need to resume, will check lipids and hepatic.  Hypertension Elevated today but has not taken her coreg. She resume her meds.

## 2014-01-13 NOTE — Patient Instructions (Signed)
Your physician recommends that you return for lab work This week or next.  Your physician recommends that you schedule a follow-up appointment in: 6 months with Dr.Jordan

## 2014-01-15 NOTE — Assessment & Plan Note (Signed)
[place] s/p BMS to prox LAD (with residual moderate mid-LAD & mid-RCA dz for medical therapy) 01/2012 - EF 40% at that time by cath EKG stable, no chest pain

## 2014-01-15 NOTE — Assessment & Plan Note (Signed)
Now off Lipitor, may need to resume, will check lipids and hepatic.

## 2014-01-15 NOTE — Assessment & Plan Note (Signed)
Elevated today but has not taken her coreg. She resume her meds.

## 2014-01-29 ENCOUNTER — Telehealth: Payer: Self-pay | Admitting: Cardiology

## 2014-01-29 LAB — LIPID PANEL
CHOL/HDL RATIO: 3 ratio
CHOLESTEROL: 144 mg/dL (ref 0–200)
HDL: 48 mg/dL (ref >39)
LDL CALC: 82 mg/dL (ref 0–99)
Triglycerides: 69 mg/dL (ref <150)
VLDL: 14 mg/dL (ref 0–40)

## 2014-01-29 LAB — COMPREHENSIVE METABOLIC PANEL WITH GFR
ALT: 15 U/L (ref 0–35)
AST: 15 U/L (ref 0–37)
Albumin: 3.8 g/dL (ref 3.5–5.2)
Alkaline Phosphatase: 66 U/L (ref 39–117)
BUN: 12 mg/dL (ref 6–23)
CO2: 27 meq/L (ref 19–32)
Calcium: 9.2 mg/dL (ref 8.4–10.5)
Chloride: 104 meq/L (ref 96–112)
Creat: 0.69 mg/dL (ref 0.50–1.10)
Glucose, Bld: 82 mg/dL (ref 70–99)
Potassium: 3.7 meq/L (ref 3.5–5.3)
Sodium: 138 meq/L (ref 135–145)
Total Bilirubin: 0.7 mg/dL (ref 0.2–1.2)
Total Protein: 6.8 g/dL (ref 6.0–8.3)

## 2014-01-29 NOTE — Telephone Encounter (Signed)
[facility] called and wanted confirmation on lipid and liver

## 2014-02-13 ENCOUNTER — Telehealth: Payer: Self-pay

## 2014-02-13 MED ORDER — LISINOPRIL 5 MG PO TABS: 5.0000 mg | ORAL_TABLET | Freq: Every day | ORAL | Status: DC

## 2014-02-13 NOTE — Telephone Encounter (Signed)
Sent in Rx as instr'd. 

## 2014-02-13 NOTE — Telephone Encounter (Signed)
Leave her on the 5 mg dose 90 day supply with 3 refills until she is seen here for followup

## 2014-02-13 NOTE — Telephone Encounter (Signed)
Dr Cleta Albertsaub, I [facility] [employer] [facility] advising that pt can save $ if we send 90 day supplies of lisinopril 5 mg to her local pharm. You last saw pt in Feb, and your plan was to increase her lisinopril to 10 mg QD d/t uncontrolled HTN. I do not see where we ever sent her a new Rx for the 10 mg. I checked 01/2014 cardiologist's notes and she is still listed as being on the lisinopril 5mg  QD, her BP was uncontrolled and his note said she hadn't taken her Coreg that day. Please advise if you want pt on the 10 mg, and if you want to OK a 90 day RF or just 30 day w/note to RTC for recheck?

## 2014-03-22 ENCOUNTER — Other Ambulatory Visit: Payer: Self-pay | Admitting: Physician Assistant

## 2014-03-24 NOTE — Telephone Encounter (Signed)
Dr Cleta Alberts, pt hasn't been in for check up since 08/2013, but you just OKd 90 day supply of another med w/3RFs until pt can f/up. When would you like pt to f/up? Do you want to also OK 90 day supplies of these meds?

## 2014-03-31 ENCOUNTER — Other Ambulatory Visit: Payer: Self-pay | Admitting: *Deleted

## 2014-03-31 MED ORDER — NITROGLYCERIN 0.4 MG SL SUBL: SUBLINGUAL_TABLET | SUBLINGUAL | Status: DC

## 2014-06-19 ENCOUNTER — Encounter (HOSPITAL_COMMUNITY): Payer: Self-pay | Admitting: Cardiology

## 2014-06-27 ENCOUNTER — Ambulatory Visit (INDEPENDENT_AMBULATORY_CARE_PROVIDER_SITE_OTHER): Payer: Managed Care, Other (non HMO) | Admitting: Emergency Medicine

## 2014-06-27 ENCOUNTER — Other Ambulatory Visit: Payer: Self-pay | Admitting: Emergency Medicine

## 2014-06-27 ENCOUNTER — Ambulatory Visit (INDEPENDENT_AMBULATORY_CARE_PROVIDER_SITE_OTHER): Payer: Managed Care, Other (non HMO)

## 2014-06-27 VITALS — BP 154/92 | HR 60 | Temp 98.4°F | Resp 16 | Ht 59.0 in | Wt 116.8 lb

## 2014-06-27 DIAGNOSIS — M25572 Pain in left ankle and joints of left foot: Secondary | ICD-10-CM

## 2014-06-27 DIAGNOSIS — M5440 Lumbago with sciatica, unspecified side: Secondary | ICD-10-CM

## 2014-06-27 DIAGNOSIS — R1013 Epigastric pain: Secondary | ICD-10-CM

## 2014-06-27 DIAGNOSIS — I10 Essential (primary) hypertension: Secondary | ICD-10-CM

## 2014-06-27 DIAGNOSIS — Z23 Encounter for immunization: Secondary | ICD-10-CM

## 2014-06-27 LAB — POCT CBC
Granulocyte percent: 63.9 %G (ref 37–80)
HEMATOCRIT: 46.2 % (ref 37.7–47.9)
HEMOGLOBIN: 15 g/dL (ref 12.2–16.2)
LYMPH, POC: 1.5 (ref 0.6–3.4)
MCH, POC: 30.7 pg (ref 27–31.2)
MCHC: 32.5 g/dL (ref 31.8–35.4)
MCV: 94.5 fL (ref 80–97)
MID (cbc): 0.4 (ref 0–0.9)
MPV: 8.5 fL (ref 0–99.8)
POC GRANULOCYTE: 3.4 (ref 2–6.9)
POC LYMPH %: 28.6 % (ref 10–50)
POC MID %: 7.5 %M (ref 0–12)
Platelet Count, POC: 167 10*3/uL (ref 142–424)
RBC: 4.89 M/uL (ref 4.04–5.48)
RDW, POC: 14.5 %
WBC: 5.3 10*3/uL (ref 4.6–10.2)

## 2014-06-27 MED ORDER — CARVEDILOL 6.25 MG PO TABS: 6.2500 mg | ORAL_TABLET | Freq: Two times a day (BID) | ORAL | Status: DC

## 2014-06-27 NOTE — Progress Notes (Addendum)
Subjective:  This chart was scribed for Nena Jordan, MD by Dellis Filbert, ED Scribe at [facility].The patient was seen in exam room 09 and the patient's care was started at 2:42 PM.   Patient ID: [name], female    DOB: [birth year], 80 y.o.   MRN: [id number] Chief Complaint  Patient presents with   other    pt is here to have an check up so make sure everything is going ok with her   HPI HPI Comments: [name] is a 80 y.o. female who presents to [facility] for a check up. Pt is overall ok and is eating well. Pt saw Cecilie Kicks and her GI doctor this year for a colonoscopy. She is not currently taking medicine for her cholesterol. She denies CP and SOB.  Abdominal Pain: She has has two episodes of gas build up causing pain in her abdomen. This is an acute complaint and does not currently bother her.  Generalized Body aches: She also has hip pain and lower back pain. Pt also notes aching in her upper back. She does not currently have pain.  Left ankle swelling: She denies a fall or injury  Left eye rash:  Onset this morning, she notes her eyeball aches, pt denies discharge from her eye. Pt wants her flu shot today.  Patient Active Problem List   Diagnosis Date Noted   Coronary artery disease    Hyperlipidemia 04/13/2012   History of CVA (cerebrovascular accident) 02/08/2012   Personal history of subdural hematoma 02/08/2012   Hypertension    Diverticulosis    Osteoporosis    Past Medical History  Diagnosis Date   Hypertension    CVA (cerebral vascular accident)     Right brain CVA 04/2011 - carotid dopplers showed no significant stenosis (tortuous ICA).    Diverticulosis    Subdural hematoma     2004 s/p craniotomy   Pyelonephritis 2006   Abnormal CT of the abdomen     Nodes  resolved   Inguinal hernia     Right, seen on CT 2008   Osteoporosis    Coronary artery disease     a. [place] s/p BMS to prox LAD (with residual  moderate mid-LAD & mid-RCA dz for medical therapy) 01/2012 - EF 40% at that time by cath.   Arthritis    LV dysfunction     EF 40% by cath 01/2012 with WMA, but 50% by f/u echo same admission   Past Surgical History  Procedure Laterality Date   Tonsillectomy and adenoidectomy     Cesarean section      X 2   Craniotomy  2004   Total abdominal hysterectomy  1985    FIBROID   Coronary stent placement  02/07/2012    LAD   Cardiac catheterization  02/07/2012   Flexible sigmoidoscopy  11/24/2004    Dr. Mann--Diverticulosis    Left heart catheterization with coronary angiogram N/A 02/07/2012    Procedure: LEFT HEART CATHETERIZATION WITH CORONARY ANGIOGRAM;  Surgeon: Peter M Martinique, MD;  Location: [facility] CATH LAB;  Service: Cardiovascular;  Laterality: N/A;   Percutaneous coronary stent intervention (pci-s)  02/07/2012    Procedure: PERCUTANEOUS CORONARY STENT INTERVENTION (PCI-S);  Surgeon: Peter M Martinique, MD;  Location: [facility] CATH LAB;  Service: Cardiovascular;;   Allergies  Allergen Reactions   Latex Itching   Flagyl [Metronidazole] Rash   Prior to Admission medications   Medication Sig Start Date End Date Taking?  Authorizing Provider  alum & mag hydroxide-simeth (MAALOX/MYLANTA) 200-200-20 MG/5ML suspension Take 15 mLs by mouth every 6 (six) hours as needed. As needed for gas.   Yes Historical Provider, MD  aspirin 81 MG tablet Take 81 mg by mouth daily.   Yes Historical Provider, MD  cetirizine (ZYRTEC) 10 MG tablet Take 10 mg by mouth daily as needed.   Yes Historical Provider, MD  hydrochlorothiazide (MICROZIDE) 12.5 MG capsule Take 1 capsule (12.5 mg total) by mouth daily. 03/25/14  Yes Darlyne Russian, MD  lisinopril (PRINIVIL,ZESTRIL) 5 MG tablet Take 1 tablet (5 mg total) by mouth daily. 02/13/14  Yes Darlyne Russian, MD  loperamide (IMODIUM A-D) 2 MG tablet Take 2 mg by mouth 4 (four) times daily as needed. As needed for loose stools.   Yes Historical Provider, MD    naphazoline-glycerin (CLEAR EYES) 0.012-0.2 % SOLN Place 1 drop into both eyes every 4 (four) hours as needed. For irritated eyes   Yes Historical Provider, MD  nitroGLYCERIN (NITROSTAT) 0.4 MG SL tablet place 1 tablet under the tongue every 5 minutes if needed for chest pain 03/31/14  Yes Peter M Martinique, MD  psyllium (METAMUCIL) 58.6 % powder Take 1 packet by mouth daily as needed.    Yes Historical Provider, MD   History   Social History   Marital Status: Single    Spouse Name: N/A    Number of Children: N/A   Years of Education: N/A   Occupational History   Not on file.   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 She is also work at [employer].   2 grown children, a daughter who lives in [place] area and a son who lives with the patient.   Review of Systems  Eyes: Positive for redness. Negative for discharge.  Respiratory: Negative for shortness of breath.   Cardiovascular: Negative for chest pain.  Musculoskeletal: Positive for myalgias, joint swelling and arthralgias.      Objective:  Blood pressure 172/92, pulse 60, temperature 98.4 F (36.9 C), temperature source Oral, resp. rate 16, height 4' 11"  (1.499 m), weight 116 lb 12.8 oz (52.98 kg), SpO2 98 %.  Physical Exam  Constitutional: She is oriented to person, place, and time. She appears well-developed and well-nourished. No distress.  HENT:  Head: Normocephalic and atraumatic.  Eyes: EOM are normal.  Puffiness of the left upper eye lid. No drainage from the left eye.  Neck: Normal range of motion.  Cardiovascular:  Normal rate, regular rhythm and normal heart sounds.   No murmur heard. Pulmonary/Chest: Effort normal and breath sounds normal.  Abdominal:  Large midline scar with a 2 cm upper abdominal hernia.  Musculoskeletal:  Puffiness around the left ankle with decreased ROM and large bunion formation  Neurological: She is alert and oriented to person, place, and time.  Skin: Skin is warm and dry.  Psychiatric: She has a normal mood and affect. Her behavior is normal.  Nursing note and vitals reviewed.  Results  for orders placed or performed in visit on 06/27/14  POCT CBC  Result Value Ref Range   WBC 5.3 4.6 - 10.2 K/uL   Lymph, poc 1.5 0.6 - 3.4   POC LYMPH PERCENT 28.6 10 - 50 %L   MID (cbc) 0.4 0 - 0.9   POC MID % 7.5 0 - 12 %M   POC Granulocyte 3.4 2 - 6.9   Granulocyte percent 63.9 37 - 80 %G   RBC 4.89 4.04 - 5.48 M/uL   Hemoglobin 15.0 12.2 - 16.2 g/dL   HCT, POC 46.2 37.7 - 47.9 %   MCV 94.5 80 - 97 fL   MCH, POC 30.7 27 - 31.2 pg   MCHC 32.5 31.8 - 35.4 g/dL   RDW, POC 14.5 %   Platelet Count, POC 167 142 - 424 K/uL   MPV 8.5 0 - 99.8 fL  [facility] reading (PRIMARY) by  Dr.Daub patient has severe multilevel degenerative disc disease with severe lumbar scoliosis multiple bridging fused osteophytes no destructive lesions are seen ankle film reveals soft tissue swelling otherwise unremarkable.     Assessment & Plan:  Patient states whenever she bends she has had discomfort she feels in her upper abdomen which extends around from both flanks. She denies any urinary symptoms but states whenever she changes positions this is very uncomfortable for her. She has had a previous colonoscopy. We'll schedule CT abdomen pelvis for evaluation. She states she has been taking her carvedilol regular. Her weight has been stable she has had no further weight loss. She has significant back disease on x-ray and will be on Tylenol for this. She did have a be met done to be sure her BUN/creatinine are  normal. We will be sure she has been taking the carvedilol as instructed.

## 2014-06-27 NOTE — Patient Instructions (Signed)
Influenza Vaccine (Flu Vaccine, Inactivated or Recombinant) 2014-2015: What You Need to Know 1. Why get vaccinated? Influenza ("flu") is a contagious disease that spreads around the [place] every winter, usually between October and May. Flu is caused by influenza viruses, and is spread mainly by coughing, sneezing, and close contact. Anyone can get flu, but the risk of getting flu is highest among children. Symptoms come on suddenly and may last several days. They can include:  fever/chills  sore throat  muscle aches  fatigue  cough  headache  runny or stuffy nose Flu can make some people much sicker than others. These people include young children, people 65 and older, pregnant women, and people with certain health conditions-such as heart, lung or kidney disease, nervous system disorders, or a weakened immune system. Flu vaccination is especially important for these people, and anyone in close contact with them. Flu can also lead to pneumonia, and make existing medical conditions worse. It can cause diarrhea and seizures in children. Each year thousands of people in the [place] die from flu, and many more are hospitalized. Flu vaccine is the best protection against flu and its complications. Flu vaccine also helps prevent spreading flu from person to person. 2. Inactivated and recombinant flu vaccines You are getting an injectable flu vaccine, which is either an "inactivated" or "recombinant" vaccine. These vaccines do not contain any live influenza virus. They are given by injection with a needle, and often called the "flu shot."  A different live, attenuated (weakened) influenza vaccine is sprayed into the nostrils. This vaccine is described in a separate Vaccine Information Statement. Flu vaccination is recommended every year. Some children 6 months through 8 years of age might need two doses during one year. Flu viruses are always changing. Each year's flu vaccine is made  to protect against 3 or 4 viruses that are likely to cause disease that year. Flu vaccine cannot prevent all cases of flu, but it is the best defense against the disease.  It takes about 2 weeks for protection to develop after the vaccination, and protection lasts several months to a year. Some illnesses that are not caused by influenza virus are often mistaken for flu. Flu vaccine will not prevent these illnesses. It can only prevent influenza. Some inactivated flu vaccine contains a very small amount of a mercury-based preservative called thimerosal. Studies have shown that thimerosal in vaccines is not harmful, but flu vaccines that do not contain a preservative are available. 3. Some people should not get this vaccine Tell the person who gives you the vaccine:  If you have any severe, life-threatening allergies. If you ever had a life-threatening allergic reaction after a dose of flu vaccine, or have a severe allergy to any part of this vaccine, including (for example) an allergy to gelatin, antibiotics, or eggs, you may be advised not to get vaccinated. Most, but not all, types of flu vaccine contain a small amount of egg protein.  If you ever had Guillain-Barr Syndrome (a severe paralyzing illness, also called GBS). Some people with a history of GBS should not get this vaccine. This should be discussed with your doctor.  If you are not feeling well. It is usually okay to get flu vaccine when you have a mild illness, but you might be advised to wait until you feel better. You should come back when you are better. 4. Risks of a vaccine reaction With a vaccine, like any medicine, there is a chance of side   effects. These are usually mild and go away on their own. Problems that could happen after any vaccine:  Brief fainting spells can happen after any medical procedure, including vaccination. Sitting or lying down for about 15 minutes can help prevent fainting, and injuries caused by a fall. Tell  your doctor if you feel dizzy, or have vision changes or ringing in the ears.  Severe shoulder pain and reduced range of motion in the arm where a shot was given can happen, very rarely, after a vaccination.  Severe allergic reactions from a vaccine are very rare, estimated at less than 1 in a million doses. If one were to occur, it would usually be within a few minutes to a few hours after the vaccination. Mild problems following inactivated flu vaccine:  soreness, redness, or swelling where the shot was given  hoarseness  sore, red or itchy eyes  cough  fever  aches  headache  itching  fatigue If these problems occur, they usually begin soon after the shot and last 1 or 2 days. Moderate problems following inactivated flu vaccine:  Young children who get inactivated flu vaccine and pneumococcal vaccine (PCV13) at the same time may be at increased risk for seizures caused by fever. Ask your doctor for more information. Tell your doctor if a child who is getting flu vaccine has ever had a seizure. Inactivated flu vaccine does not contain live flu virus, so you cannot get the flu from this vaccine. As with any medicine, there is a very remote chance of a vaccine causing a serious injury or death. The safety of vaccines is always being monitored. For more information, visit: www.cdc.gov/vaccinesafety/ 5. What if there is a serious reaction? What should I look for?  Look for anything that concerns you, such as signs of a severe allergic reaction, very high fever, or behavior changes. Signs of a severe allergic reaction can include hives, swelling of the face and throat, difficulty breathing, a fast heartbeat, dizziness, and weakness. These would start a few minutes to a few hours after the vaccination. What should I do?  If you think it is a severe allergic reaction or other emergency that can't wait, call 9-1-1 and get the person to the nearest hospital. Otherwise, call your  doctor.  Afterward, the reaction should be reported to the Vaccine Adverse Event Reporting System (VAERS). Your doctor should file this report, or you can do it yourself through the VAERS web site at www.vaers.hhs.gov, or by calling 1-800-822-7967. VAERS does not give medical advice. 6. The National Vaccine Injury Compensation Program The National Vaccine Injury Compensation Program (VICP) is a federal program that was created to compensate people who may have been injured by certain vaccines. Persons who believe they may have been injured by a vaccine can learn about the program and about filing a claim by calling 1-800-338-2382 or visiting the VICP website at www.hrsa.gov/vaccinecompensation. There is a time limit to file a claim for compensation. 7. How can I learn more?  Ask your health care provider.  Call your local or state health department.  Contact the Centers for Disease Control and Prevention (CDC):  Call 1-800-232-4636 (1-800-CDC-INFO) or  Visit CDC's website at www.cdc.gov/flu CDC Vaccine Information Statement (Interim) Inactivated Influenza Vaccine (02/26/2013) Document Released: 04/21/2006 Document Revised: 11/11/2013 Document Reviewed: 06/14/2013 ExitCare Patient Information 2015 [employer]. This information is not intended to replace advice given to you by your health care provider. Make sure you discuss any questions you have with your health   care provider.  

## 2014-06-27 NOTE — Progress Notes (Signed)
Subjective:  This chart was scribed for Earl Lites, MD by Haywood Pao, ED Scribe at [facility].The patient was seen in exam room 09 and the patient's care was started at 2:42 PM.   Patient ID: [name], female    DOB: Nov 05, 1933, 80 y.o.   MRN: [id number] Chief Complaint  Patient presents with  . other    pt is here to have an check up so make sure everything is going ok with her   HPI HPI Comments: [name] is a 80 y.o. female who presents to [facility] for a check up. Pt is overall ok and is eating well. Pt saw Nada Boozer and her GI doctor this year for a colonoscopy. She is not currently taking medicine for her cholesterol. She denies CP and SOB.  Abdominal Pain: She has has two episodes of gas build up causing pain in her abdomen. This is an acute complaint and does not currently bother her.  Generalized Body aches: She also has hip pain and lower back pain. Pt also notes aching in her upper back. She does not currently have pain.  Left ankle swelling: She denies a fall or injury  Left eye rash:  Onset this morning, she notes her eyeball aches, pt denies discharge from her eye. Pt wants her flu shot today.  Patient Active Problem List   Diagnosis Date Noted  . Coronary artery disease   . Hyperlipidemia 04/13/2012  . History of CVA (cerebrovascular accident) 02/08/2012  . Personal history of subdural hematoma 02/08/2012  . Hypertension   . Diverticulosis   . Osteoporosis    Past Medical History  Diagnosis Date  . Hypertension   . CVA (cerebral vascular accident)     Right brain CVA 04/2011 - carotid dopplers showed no significant stenosis (tortuous ICA).   . Diverticulosis   . Subdural hematoma     2004 s/p craniotomy  . Pyelonephritis 2006  . Abnormal CT of the abdomen     Nodes  resolved  . Inguinal hernia     Right, seen on CT 2008  . Osteoporosis   . Coronary artery disease     a. [place] s/p BMS to prox LAD (with residual  moderate mid-LAD & mid-RCA dz for medical therapy) 01/2012 - EF 40% at that time by cath.  . Arthritis   . LV dysfunction     EF 40% by cath 01/2012 with WMA, but 50% by f/u echo same admission   Past Surgical History  Procedure Laterality Date  . Tonsillectomy and adenoidectomy    . Cesarean section      X 2  . Craniotomy  2004  . Total abdominal hysterectomy  1985    FIBROID  . Coronary stent placement  02/07/2012    LAD  . Cardiac catheterization  02/07/2012  . Flexible sigmoidoscopy  11/24/2004    Dr. Mann--Diverticulosis   . Left heart catheterization with coronary angiogram N/A 02/07/2012    Procedure: LEFT HEART CATHETERIZATION WITH CORONARY ANGIOGRAM;  Surgeon: Peter M Swaziland, MD;  Location: [facility] CATH LAB;  Service: Cardiovascular;  Laterality: N/A;  . Percutaneous coronary stent intervention (pci-s)  02/07/2012    Procedure: PERCUTANEOUS CORONARY STENT INTERVENTION (PCI-S);  Surgeon: Peter M Swaziland, MD;  Location: [facility] CATH LAB;  Service: Cardiovascular;;   Allergies  Allergen Reactions  . Latex Itching  . Flagyl [Metronidazole] Rash   Prior to Admission medications   Medication Sig Start Date End Date Taking?  Authorizing Provider  alum & mag hydroxide-simeth (MAALOX/MYLANTA) 200-200-20 MG/5ML suspension Take 15 mLs by mouth every 6 (six) hours as needed. As needed for gas.   Yes Historical Provider, MD  aspirin 81 MG tablet Take 81 mg by mouth daily.   Yes Historical Provider, MD  cetirizine (ZYRTEC) 10 MG tablet Take 10 mg by mouth daily as needed.   Yes Historical Provider, MD  hydrochlorothiazide (MICROZIDE) 12.5 MG capsule Take 1 capsule (12.5 mg total) by mouth daily. 03/25/14  Yes Collene Gobble, MD  lisinopril (PRINIVIL,ZESTRIL) 5 MG tablet Take 1 tablet (5 mg total) by mouth daily. 02/13/14  Yes Collene Gobble, MD  loperamide (IMODIUM A-D) 2 MG tablet Take 2 mg by mouth 4 (four) times daily as needed. As needed for loose stools.   Yes Historical Provider, MD    naphazoline-glycerin (CLEAR EYES) 0.012-0.2 % SOLN Place 1 drop into both eyes every 4 (four) hours as needed. For irritated eyes   Yes Historical Provider, MD  nitroGLYCERIN (NITROSTAT) 0.4 MG SL tablet place 1 tablet under the tongue every 5 minutes if needed for chest pain 03/31/14  Yes Peter M Swaziland, MD  psyllium (METAMUCIL) 58.6 % powder Take 1 packet by mouth daily as needed.    Yes Historical Provider, MD   History   Social History  . Marital Status: Single    Spouse Name: N/A    Number of Children: N/A  . Years of Education: N/A   Occupational History  . Not on file.   Social History Main Topics  . Smoking status: Former Smoker    Types: Cigarettes    Quit date: 04/10/2003  . Smokeless tobacco: Never Used     Comment: quit 2004  . Alcohol Use: Yes     Comment: occasional  . Drug Use: No  . Sexual Activity: Not Currently    Birth Control/ Protection: Post-menopausal   Other Topics Concern  . 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   2 grown children, a daughter who lives in [place] area and a son who lives with the patient.   Review of Systems  Eyes: Positive for redness. Negative for discharge.  Respiratory: Negative for shortness of breath.   Cardiovascular: Negative for chest pain.  Musculoskeletal: Positive for myalgias, joint swelling and arthralgias.      Objective:  Blood pressure 154/92, pulse 60, temperature 98.4 F (36.9 C), temperature source Oral, resp. rate 16, height 4\' 11"  (1.499 m), weight 116 lb 12.8 oz (52.98 kg), SpO2 98 %.  Physical Exam  Constitutional: She is oriented to person, place, and time. She appears well-developed and well-nourished. No distress.  HENT:  Head: Normocephalic and atraumatic.  Eyes: EOM are normal.  Puffiness of the left upper eye lid. No drainage from the left eye.  Neck: Normal range of motion.  Cardiovascular:  Normal rate, regular rhythm and normal heart sounds.   No murmur heard. Pulmonary/Chest: Effort normal and breath sounds normal.  Abdominal:  Large midline scar with a 2 cm upper abdominal hernia.  Musculoskeletal:  Puffiness around the left ankle with decreased ROM and large bunion formation  Neurological: She is alert and oriented to person, place, and time.  Skin: Skin is warm and dry.  Psychiatric: She has a normal mood and affect. Her behavior is normal.  Nursing note and vitals reviewed.  Results  for orders placed or performed in visit on 06/27/14  POCT CBC  Result Value Ref Range   WBC 5.3 4.6 - 10.2 K/uL   Lymph, poc 1.5 0.6 - 3.4   POC LYMPH PERCENT 28.6 10 - 50 %L   MID (cbc) 0.4 0 - 0.9   POC MID % 7.5 0 - 12 %M   POC Granulocyte 3.4 2 - 6.9   Granulocyte percent 63.9 37 - 80 %G   RBC 4.89 4.04 - 5.48 M/uL   Hemoglobin 15.0 12.2 - 16.2 g/dL   HCT, POC 24.4 01.0 - 47.9 %   MCV 94.5 80 - 97 fL   MCH, POC 30.7 27 - 31.2 pg   MCHC 32.5 31.8 - 35.4 g/dL   RDW, POC 27.2 %   Platelet Count, POC 167 142 - 424 K/uL   MPV 8.5 0 - 99.8 fL  [facility] reading (PRIMARY) by  Dr.Kanin Lia patient has severe multilevel degenerative disc disease with severe lumbar scoliosis multiple bridging fused osteophytes no destructive lesions are seen ankle film reveals soft tissue swelling otherwise unremarkable.     Assessment & Plan:  Patient states whenever she bends she has had discomfort she feels in her upper abdomen which extends around from both flanks. She denies any urinary symptoms but states whenever she changes positions this is very uncomfortable for her. She has had a previous colonoscopy. We'll schedule CT abdomen pelvis for evaluation. She states she has been taking her carvedilol regular. Her weight has been stable she has had no further weight loss. She has significant back disease on x-ray and will be on Tylenol for this. She did have a be met done to be sure her BUN/creatinine are  normal. We will be sure she has been taking the carvedilol as instructed.

## 2014-06-28 LAB — BASIC METABOLIC PANEL WITH GFR
BUN: 10 mg/dL (ref 6–23)
CO2: 32 meq/L (ref 19–32)
Calcium: 9.8 mg/dL (ref 8.4–10.5)
Chloride: 102 mEq/L (ref 96–112)
Creat: 0.87 mg/dL (ref 0.50–1.10)
GFR, Est African American: 74 mL/min
GFR, Est Non African American: 64 mL/min
Glucose, Bld: 70 mg/dL (ref 70–99)
POTASSIUM: 3.5 meq/L (ref 3.5–5.3)
SODIUM: 141 meq/L (ref 135–145)

## 2014-06-30 ENCOUNTER — Telehealth: Payer: Self-pay

## 2014-06-30 ENCOUNTER — Encounter: Payer: Self-pay | Admitting: *Deleted

## 2014-06-30 NOTE — Telephone Encounter (Signed)
She just had her flu shot 12/18-this past Friday and she thinks this has given her a cold. She is sneezing and mucous is coming out of her nose. She wants to know what to do. Please advise at [phone]

## 2014-06-30 NOTE — Telephone Encounter (Signed)
Lm for rtn call 

## 2014-07-01 NOTE — Telephone Encounter (Signed)
Lm for pt to rtc if still having symptoms.

## 2014-07-16 ENCOUNTER — Telehealth: Payer: Self-pay

## 2014-07-16 NOTE — Telephone Encounter (Signed)
[facility] called to say that they saw a note in this pt's referral for a CT to not schedule. Unable to see this note and Dr. Ellis Parentsaub's OV note said he wanted it, so told them to go ahead and schedule pt.

## 2014-08-01 ENCOUNTER — Other Ambulatory Visit: Payer: Managed Care, Other (non HMO)

## 2014-08-06 ENCOUNTER — Ambulatory Visit
Admission: RE | Admit: 2014-08-06 | Discharge: 2014-08-06 | Disposition: A | Discharge status: Home / Self Care | Payer: Managed Care, Other (non HMO) | Source: Ambulatory Visit | Attending: Emergency Medicine | Admitting: Emergency Medicine

## 2014-08-06 DIAGNOSIS — R1013 Epigastric pain: Secondary | ICD-10-CM

## 2014-08-06 MED ORDER — IOHEXOL 300 MG/ML  SOLN
100.0000 mL | Freq: Once | INTRAMUSCULAR | Status: AC | PRN
  Administered 2014-08-06: 100 mL via INTRAVENOUS

## 2014-08-07 ENCOUNTER — Other Ambulatory Visit: Payer: Self-pay | Admitting: Emergency Medicine

## 2014-08-07 DIAGNOSIS — K8020 Calculus of gallbladder without cholecystitis without obstruction: Secondary | ICD-10-CM

## 2014-08-07 DIAGNOSIS — K4090 Unilateral inguinal hernia, without obstruction or gangrene, not specified as recurrent: Secondary | ICD-10-CM

## 2014-08-08 ENCOUNTER — Other Ambulatory Visit: Payer: Self-pay | Admitting: Emergency Medicine

## 2014-08-08 DIAGNOSIS — K4090 Unilateral inguinal hernia, without obstruction or gangrene, not specified as recurrent: Secondary | ICD-10-CM

## 2014-08-08 DIAGNOSIS — K8020 Calculus of gallbladder without cholecystitis without obstruction: Secondary | ICD-10-CM

## 2014-08-09 ENCOUNTER — Other Ambulatory Visit: Payer: Self-pay

## 2014-08-09 DIAGNOSIS — K8020 Calculus of gallbladder without cholecystitis without obstruction: Secondary | ICD-10-CM

## 2014-08-09 DIAGNOSIS — K4090 Unilateral inguinal hernia, without obstruction or gangrene, not specified as recurrent: Secondary | ICD-10-CM

## 2014-08-13 ENCOUNTER — Telehealth: Payer: Self-pay

## 2014-08-13 NOTE — Telephone Encounter (Signed)
Pt states Dr.[facility]ub [facility] and even though she got her results, would like to know what foods should she stay away from or what foods should she be eating Please call pt at [phone] or her cell at [phone]

## 2014-08-14 NOTE — Telephone Encounter (Signed)
Patient returned phone call. °

## 2014-08-14 NOTE — Telephone Encounter (Signed)
Pt has gallstones and a hernia. Referred to [facility]. Please advise if pt should have any food restrictions.

## 2014-08-14 NOTE — Telephone Encounter (Signed)
Left message for pt to call back  °

## 2014-08-15 NOTE — Telephone Encounter (Signed)
I would suggest she avoid greasy foods.

## 2014-08-15 NOTE — Telephone Encounter (Signed)
Spoke with with pt, advised message from Dr. Cleta Albertsaub. Pt understood.

## 2014-08-19 ENCOUNTER — Telehealth: Payer: Self-pay | Admitting: Cardiology

## 2014-08-19 NOTE — Telephone Encounter (Signed)
New message      Request for surgical clearance:  1. What type of surgery is being performed? Hernia repair  2. When is this surgery scheduled? Not scheduled   3. Are there any medications that need to be held prior to surgery and how long? no  4. Name of physician performing surgery? Dr Dwain SarnaWakefield  5. What is your office phone and fax number? Fax [phone]

## 2014-08-19 NOTE — Telephone Encounter (Signed)
She is cleared for hernia surgery. OK to continue all meds.  Kyser Wandel SwazilandJordan MD, Kahi MohalaFACC

## 2014-08-19 NOTE — Telephone Encounter (Signed)
Message sent to Dr.Jordan for advice. 

## 2014-08-20 NOTE — Telephone Encounter (Signed)
Returned call to [place] with Dr.Wakefield's office.Dr.Jordan cleared patient for hernia surgery.Ok to continue all meds.

## 2014-08-22 ENCOUNTER — Telehealth: Payer: Self-pay

## 2014-09-05 NOTE — Telephone Encounter (Signed)
Opened in error

## 2014-10-20 ENCOUNTER — Ambulatory Visit (INDEPENDENT_AMBULATORY_CARE_PROVIDER_SITE_OTHER): Payer: Managed Care, Other (non HMO) | Admitting: Cardiology

## 2014-10-20 ENCOUNTER — Encounter: Payer: Self-pay | Admitting: Cardiology

## 2014-10-20 VITALS — BP 160/100 | HR 76 | Ht 59.0 in | Wt 118.5 lb

## 2014-10-20 DIAGNOSIS — I10 Essential (primary) hypertension: Secondary | ICD-10-CM | POA: Diagnosis not present

## 2014-10-20 DIAGNOSIS — I2510 Atherosclerotic heart disease of native coronary artery without angina pectoris: Secondary | ICD-10-CM

## 2014-10-20 DIAGNOSIS — E785 Hyperlipidemia, unspecified: Secondary | ICD-10-CM | POA: Diagnosis not present

## 2014-10-20 NOTE — Patient Instructions (Signed)
Continue on your medications including Coreg, HCTZ and lisinopril.  Try and walk regularly  I will see you in 6 months

## 2014-10-20 NOTE — Progress Notes (Signed)
[name] Date of Birth: 1934-05-16 Medical Record #[id number]  History of Present Illness: [name] is seen for followup CAD. She is status post stenting of the proximal LAD with a bare-metal stent in July of 2013. Ejection fraction was 50% by echocardiogram. She continues to do very well and denies any recurrent chest pain, shortness of breath, dizziness, or palpitations. She is confused about her medication. She states the last time she picked up her medication from the pharmacy she didn't even open the bag and thinks she has lost it. She does think she is taking Coreg and HCTZ but isn't taking lisinopril. Reviewing her prior notes it seems that compliance with medication has been an issue.   Current Outpatient Prescriptions on File Prior to Visit  Medication Sig Dispense Refill  . alum & mag hydroxide-simeth (MAALOX/MYLANTA) 200-200-20 MG/5ML suspension Take 15 mLs by mouth every 6 (six) hours as needed. As needed for gas.    Marland Kitchen aspirin 81 MG tablet Take 81 mg by mouth daily.    . carvedilol (COREG) 6.25 MG tablet Take 1 tablet (6.25 mg total) by mouth 2 (two) times daily. 180 tablet 3  . cetirizine (ZYRTEC) 10 MG tablet Take 10 mg by mouth daily as needed.    . hydrochlorothiazide (MICROZIDE) 12.5 MG capsule Take 1 capsule (12.5 mg total) by mouth daily. 90 capsule 0  . lisinopril (PRINIVIL,ZESTRIL) 5 MG tablet Take 1 tablet (5 mg total) by mouth daily. 90 tablet 3  . loperamide (IMODIUM A-D) 2 MG tablet Take 2 mg by mouth 4 (four) times daily as needed. As needed for loose stools.    . naphazoline-glycerin (CLEAR EYES) 0.012-0.2 % SOLN Place 1 drop into both eyes every 4 (four) hours as needed. For irritated eyes    . nitroGLYCERIN (NITROSTAT) 0.4 MG SL tablet place 1 tablet under the tongue every 5 minutes if needed for chest pain 25 tablet 1  . psyllium (METAMUCIL) 58.6 % powder Take 1 packet by mouth daily as needed.      No current facility-administered medications on file  prior to visit.    Allergies  Allergen Reactions  . Latex Itching  . Flagyl [Metronidazole] Rash    Past Medical History  Diagnosis Date  . Hypertension   . CVA (cerebral vascular accident)     Right brain CVA 04/2011 - carotid dopplers showed no significant stenosis (tortuous ICA).   . Diverticulosis   . Subdural hematoma     2004 s/p craniotomy  . Pyelonephritis 2006  . Abnormal CT of the abdomen     Nodes  resolved  . Inguinal hernia     Right, seen on CT 2008  . Osteoporosis   . Coronary artery disease     a. [place] s/p BMS to prox LAD (with residual moderate mid-LAD & mid-RCA dz for medical therapy) 01/2012 - EF 40% at that time by cath.  . Arthritis   . LV dysfunction     EF 40% by cath 01/2012 with WMA, but 50% by f/u echo same admission    Past Surgical History  Procedure Laterality Date  . Tonsillectomy and adenoidectomy    . Cesarean section      X 2  . Craniotomy  2004  . Total abdominal hysterectomy  1985    FIBROID  . Coronary stent placement  02/07/2012    LAD  . Cardiac catheterization  02/07/2012  . Flexible sigmoidoscopy  11/24/2004    Dr. Mann--Diverticulosis   .  Left heart catheterization with coronary angiogram N/A 02/07/2012    Procedure: LEFT HEART CATHETERIZATION WITH CORONARY ANGIOGRAM;  Surgeon: Peter M SwazilandJordan, MD;  [facility];  Service: Cardiovascular;  Laterality: N/A;  . Percutaneous coronary stent intervention (pci-s)  02/07/2012    Procedure: PERCUTANEOUS CORONARY STENT INTERVENTION (PCI-S);  Surgeon: Peter M SwazilandJordan, MD;  [facility];  Service: Cardiovascular;;    History  Smoking status  . Former Smoker  . Types: Cigarettes  . Quit date: 04/10/2003  Smokeless tobacco  . Never Used    Comment: quit 2004    History  Alcohol Use  . Yes    Comment: occasional    Family History  Problem Relation Age of Onset  . Stroke Father     Review of Systems: As noted in history of present illness. She does note some  chronic swelling in her left ankle.  All other systems were reviewed and are negative.  Physical Exam: BP 160/100 mmHg  Pulse 76  Ht 4\' 11"  (1.499 m)  Wt 118 lb 8 oz (53.751 kg)  BMI 23.92 kg/m2 She is a pleasant, elderly white female in no acute distress. Her HEENT exam is unremarkable. She has no JVD or bruits. Lungs are clear. Cardiac exam reveals a regular rate and rhythm without gallop, murmur, or click. Abdomen is soft and nontender without masses or bruits. Extremities show good pulses. Mild swelling left ankle.  She is alert and oriented x3. Cranial nerves II through XII are intact.  LABORATORY DATA:   Lab Results  Component Value Date   WBC 5.3 06/27/2014   HGB 15.0 06/27/2014   HCT 46.2 06/27/2014   PLT 162 02/08/2012   GLUCOSE 70 06/27/2014   CHOL 144 01/29/2014   TRIG 69 01/29/2014   HDL 48 01/29/2014   LDLCALC 82 01/29/2014   ALT 15 01/29/2014   AST 15 01/29/2014   NA 141 06/27/2014   K 3.5 06/27/2014   CL 102 06/27/2014   CREATININE 0.87 06/27/2014   BUN 10 06/27/2014   CO2 32 06/27/2014   TSH 2.085 02/07/2012   INR 1.12 02/07/2012   HGBA1C 5.8* 04/13/2011    Assessment / Plan: 1. Coronary disease status post stenting of the proximal LAD in July 2013 for unstable angina. This was a bare-metal stent.  She is asymptomatic. Continue baby aspirin daily.  2. Hypertension, blood pressure is elevated. She is not taking medications as prescribed. Printed out a new list of medication and instructed her to get new refills.   3. Hyperlipidemia. Under good control.   4. history of subdural hematoma following trauma in 2004.  5. History of CVA in October 2012.

## 2014-10-29 ENCOUNTER — Telehealth: Payer: Self-pay

## 2014-10-29 NOTE — Telephone Encounter (Signed)
Dr Cleta Albertsaub, [facility] stated that they sent a request for medical clearance for surgery twice in Feb, but they have not received a note yet. I do not see any notes in EPIC about it so don't know if you received it or not. I asked her to re-fax to me and I have put it in your box. Dr SwazilandJordan already gave cardiac clearance, so they only need medical clearance from you.

## 2014-10-30 NOTE — Telephone Encounter (Signed)
Babara placed this form in your box.

## 2014-10-30 NOTE — Telephone Encounter (Signed)
I can give medical clearance but she is on blood thinners and has recently received clearance from Dr. SwazilandJordan. If I have the form I will be happy to sign it for medical clearance. I have actually sent in a previous release.

## 2014-11-09 DEATH — deceased

## 2014-11-25 ENCOUNTER — Telehealth: Payer: Self-pay

## 2014-11-25 NOTE — Telephone Encounter (Signed)
Pt states she is coming in to see Dr Cleta Albertsaub in the walkin on Friday 11/28/14 But she wanted me to note and advise Dr Cleta Albertsaub, she is scheduled to see Dr Dwain SarnaWakefield on 12/19/14 @ 11:00

## 2014-11-25 NOTE — Telephone Encounter (Signed)
Dr Cleta Albertsaub: Lorain ChildesFYI

## 2014-12-18 ENCOUNTER — Other Ambulatory Visit: Payer: Self-pay | Admitting: Emergency Medicine

## 2014-12-19 NOTE — Telephone Encounter (Signed)
Advise on refill. On last office visit she was planning to come in on 11/28/2014.

## 2015-03-25 ENCOUNTER — Ambulatory Visit (INDEPENDENT_AMBULATORY_CARE_PROVIDER_SITE_OTHER): Payer: Medicare HMO | Admitting: Emergency Medicine

## 2015-03-25 VITALS — BP 130/92 | HR 69 | Temp 97.8°F | Resp 18 | Ht 59.0 in | Wt 120.0 lb

## 2015-03-25 DIAGNOSIS — I10 Essential (primary) hypertension: Secondary | ICD-10-CM | POA: Diagnosis not present

## 2015-03-25 DIAGNOSIS — Z23 Encounter for immunization: Secondary | ICD-10-CM

## 2015-03-25 DIAGNOSIS — R609 Edema, unspecified: Secondary | ICD-10-CM

## 2015-03-25 LAB — POCT CBC
Granulocyte percent: 67 %G (ref 37–80)
HCT, POC: 40.1 % (ref 37.7–47.9)
Hemoglobin: 13 g/dL (ref 12.2–16.2)
Lymph, poc: 1.4 (ref 0.6–3.4)
MCH: 29.9 pg (ref 27–31.2)
MCHC: 32.3 g/dL (ref 31.8–35.4)
MCV: 92.3 fL (ref 80–97)
MID (cbc): 0.4 (ref 0–0.9)
MPV: 8.6 fL (ref 0–99.8)
POC Granulocyte: 3.6 (ref 2–6.9)
POC LYMPH PERCENT: 25.9 %L (ref 10–50)
POC MID %: 7.1 % (ref 0–12)
Platelet Count, POC: 155 10*3/uL (ref 142–424)
RBC: 4.35 M/uL (ref 4.04–5.48)
RDW, POC: 16.2 %
WBC: 5.4 10*3/uL (ref 4.6–10.2)

## 2015-03-25 LAB — TSH: TSH: 2.283 u[IU]/mL (ref 0.350–4.500)

## 2015-03-25 MED ORDER — FUROSEMIDE 20 MG PO TABS: 20.0000 mg | ORAL_TABLET | Freq: Every day | ORAL | Status: DC

## 2015-03-25 MED ORDER — POTASSIUM CHLORIDE ER 10 MEQ PO CPCR: ORAL_CAPSULE | ORAL | Status: DC

## 2015-03-25 NOTE — Progress Notes (Addendum)
Subjective:  This chart was scribed for Lesle Chris MD, by Veverly Fells, at [facility].  This patient was seen in room 2 and the patient's care was started at 1:16 PM.    Patient ID: [name], female    DOB: 10-25-1933, 81 y.o.   MRN: [id number] Chief Complaint  Patient presents with  . Edema    feet and ankles more than usual  . Memory Loss    Struggling to remember stuff    HPI  HPI Comments: [name] is a 81 y.o. female who presents to the [facility] complaining of edema in her lower extremeties.  Patient recently had her hernia checked and was told that she did not need an operation.  She currently lives alone in an independent living senior apartment.  and sometimes takes her medication around 1 pm in the afternoon (when she eats breakfast).  She saw Dr. Swaziland in April who was concerned that she was not compliant fully with her medication.   Edema: Patients daughter states that her mothers legs are more swollen than usual.  She states that she can feel the swelling but doesn't have any pain associated with it.  Denies any recent travel. Patient has an appointment with her cardiologist in October.  Her weight has gone up from July of last year and slightly since April.  Patient denies chest pain or SOB and sleeps with 1 pillow at night.    Memory Loss: Daughter states that her mother does not sleep often and doesn't interact with those in her community.  She is wondering if there is anything that she can eat to help her with her memory as it seems to be slightly changing recently.      Patient Active Problem List   Diagnosis Date Noted  . Coronary artery disease   . Hyperlipidemia 04/13/2012  . History of CVA (cerebrovascular accident) 02/08/2012  . Personal history of subdural hematoma 02/08/2012  . Hypertension   . Diverticulosis   . Osteoporosis    Past Medical History  Diagnosis Date  . Hypertension   . CVA  (cerebral vascular accident)     Right brain CVA 04/2011 - carotid dopplers showed no significant stenosis (tortuous ICA).   . Diverticulosis   . Subdural hematoma     2004 s/p craniotomy  . Pyelonephritis 2006  . Abnormal CT of the abdomen     Nodes  resolved  . Inguinal hernia     Right, seen on CT 2008  . Osteoporosis   . Coronary artery disease     a. [place] s/p BMS to prox LAD (with residual moderate mid-LAD & mid-RCA dz for medical therapy) 01/2012 - EF 40% at that time by cath.  . Arthritis   . LV dysfunction     EF 40% by cath 01/2012 with WMA, but 50% by f/u echo same admission   Past Surgical History  Procedure Laterality Date  . Tonsillectomy and adenoidectomy    . Cesarean section      X 2  . Craniotomy  2004  . Total abdominal hysterectomy  1985    FIBROID  . Coronary stent placement  02/07/2012    LAD  . Cardiac catheterization  02/07/2012  . Flexible sigmoidoscopy  11/24/2004    Dr. Mann--Diverticulosis   . Left heart catheterization with coronary angiogram N/A 02/07/2012    Procedure: LEFT HEART CATHETERIZATION WITH CORONARY ANGIOGRAM;  Surgeon: Peter M Swaziland,  MD;  Location: [facility] CATH LAB;  Service: Cardiovascular;  Laterality: N/A;  . Percutaneous coronary stent intervention (pci-s)  02/07/2012    Procedure: PERCUTANEOUS CORONARY STENT INTERVENTION (PCI-S);  Surgeon: Peter M Swaziland, MD;  Location: [facility] CATH LAB;  Service: Cardiovascular;;   Allergies  Allergen Reactions  . Latex Itching  . Flagyl [Metronidazole] Rash   Prior to Admission medications   Medication Sig Start Date End Date Taking? Authorizing Provider  alum & mag hydroxide-simeth (MAALOX/MYLANTA) 200-200-20 MG/5ML suspension Take 15 mLs by mouth every 6 (six) hours as needed. As needed for gas.    Historical Provider, MD  aspirin 81 MG tablet Take 81 mg by mouth daily.    Historical Provider, MD  carvedilol (COREG) 6.25 MG tablet Take 1 tablet (6.25 mg total) by mouth 2 (two) times daily. 06/27/14    Collene Gobble, MD  cetirizine (ZYRTEC) 10 MG tablet Take 10 mg by mouth daily as needed.    Historical Provider, MD  hydrochlorothiazide (MICROZIDE) 12.5 MG capsule take 1 capsule by mouth once daily 12/19/14   Collene Gobble, MD  lisinopril (PRINIVIL,ZESTRIL) 5 MG tablet Take 1 tablet (5 mg total) by mouth daily. 02/13/14   Collene Gobble, MD  loperamide (IMODIUM A-D) 2 MG tablet Take 2 mg by mouth 4 (four) times daily as needed. As needed for loose stools.    Historical Provider, MD  naphazoline-glycerin (CLEAR EYES) 0.012-0.2 % SOLN Place 1 drop into both eyes every 4 (four) hours as needed. For irritated eyes    Historical Provider, MD  nitroGLYCERIN (NITROSTAT) 0.4 MG SL tablet place 1 tablet under the tongue every 5 minutes if needed for chest pain 03/31/14   Peter M Swaziland, MD  psyllium (METAMUCIL) 58.6 % powder Take 1 packet by mouth daily as needed.     Historical Provider, MD   Social History   Social History  . Marital Status: Single    Spouse Name: N/A  . Number of Children: N/A  . Years of Education: N/A   Occupational History  . Not on file.   Social History Main Topics  . Smoking status: Former Smoker    Types: Cigarettes    Quit date: 04/10/2003  . Smokeless tobacco: Never Used     Comment: quit 2004  . Alcohol Use: Yes     Comment: occasional  . Drug Use: No  . Sexual Activity: Not Currently    Birth Control/ Protection: Post-menopausal   Other Topics Concern  . 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   2 grown children, a daughter who lives in [place] area and a son who lives with the patient.        Review of Systems  Constitutional: Negative for fever and chills.  Respiratory: Negative for cough, choking, chest tightness and shortness of breath.   Cardiovascular: Positive for leg swelling. Negative for chest pain.  Gastrointestinal: Negative for nausea  and vomiting.  Musculoskeletal: Negative for neck pain and neck stiffness.       Objective:   Physical Exam CONSTITUTIONAL: Well developed/well nourished HEAD: Normocephalic/atraumatic EYES: EOMI/PERRL ENMT: Mucous membranes moist NECK: supple no meningeal signs SPINE/BACK:entire spine nontender CV: S1/S2 noted, no murmurs/rubs/gallops noted  LUNGS: Lungs are clear to auscultation bilaterally, no apparent distress ABDOMEN: soft, nontender, no rebound or guarding, bowel sounds noted throughout abdomen GU:no cva tenderness EXTREMITIES:  She has 2+ edema up to the proximal tibia.  SKIN: warm, color normal PSYCH: no abnormalities of mood noted, alert and oriented to situation     Filed Vitals:   03/25/15 1238  BP: 130/92  Pulse: 69  Temp: 97.8 F (36.6 C)  TempSrc: Oral  Resp: 18  Height: 4\' 11"  (1.499 m)  Weight: 120 lb (54.432 kg)  SpO2: 98%   Results for orders placed or performed in visit on 03/25/15  POCT CBC  Result Value Ref Range   WBC 5.4 4.6 - 10.2 K/uL   Lymph, poc 1.4 0.6 - 3.4   POC LYMPH PERCENT 25.9 10 - 50 %L   MID (cbc) 0.4 0 - 0.9   POC MID % 7.1 0 - 12 %M   POC Granulocyte 3.6 2 - 6.9   Granulocyte percent 67.0 37 - 80 %G   RBC 4.35 4.04 - 5.48 M/uL   Hemoglobin 13.0 12.2 - 16.2 g/dL   HCT, POC 40.9 81.1 - 47.9 %   MCV 92.3 80 - 97 fL   MCH, POC 29.9 27 - 31.2 pg   MCHC 32.3 31.8 - 35.4 g/dL   RDW, POC 91.4 %   Platelet Count, POC 155 142 - 424 K/uL   MPV 8.6 0 - 99.8 fL          Assessment & Plan:  We'll check a renal panel. I encouraged her to be more active. Will change her diuretic from [facility] diuril . Lasix 20mg . one a day along with potassium 10 mEq 1 a day. She does have a follow-up visit with her cardiologist in about 2-1/2 weeks. I personally performed the services described in this documentation, which was scribed in my presence. The recorded information has been reviewed and is accurate.

## 2015-03-25 NOTE — Patient Instructions (Addendum)
Please stop your hydrochlorothiazide. Take Lasix one a day. Take potassium pill one a day.       Edema Edema is an abnormal buildup of fluids in your bodytissues. Edema is somewhatdependent on gravity to pull the fluid to the lowest place in your body. That makes the condition more common in the legs and thighs (lower extremities). Painless swelling of the feet and ankles is common and becomes more likely as you get older. It is also common in looser tissues, like around your eyes.  When the affected area is squeezed, the fluid may move out of that spot and leave a dent for a few moments. This dent is called pitting.  CAUSES  There are many possible causes of edema. Eating too much salt and being on your feet or sitting for a long time can cause edema in your legs and ankles. Hot weather may make edema worse. Common medical causes of edema include:  Heart failure.  Liver disease.  Kidney disease.  Weak blood vessels in your legs.  Cancer.  An injury.  Pregnancy.  Some medications.  Obesity. SYMPTOMS  Edema is usually painless.Your skin may look swollen or shiny.  DIAGNOSIS  Your health care provider may be able to diagnose edema by asking about your medical history and doing a physical exam. You may need to have tests such as X-rays, an electrocardiogram, or blood tests to check for medical conditions that may cause edema.  TREATMENT  Edema treatment depends on the cause. If you have heart, liver, or kidney disease, you need the treatment appropriate for these conditions. General treatment may include:  Elevation of the affected body part above the level of your heart.  Compression of the affected body part. Pressure from elastic bandages or support stockings squeezes the tissues and forces fluid back into the blood vessels. This keeps fluid from entering the tissues.  Restriction of fluid and salt intake.  Use of a water pill (diuretic). These medications are  appropriate only for some types of edema. They pull fluid out of your body and make you urinate more often. This gets rid of fluid and reduces swelling, but diuretics can have side effects. Only use diuretics as directed by your health care provider. HOME CARE INSTRUCTIONS   Keep the affected body part above the level of your heart when you are lying down.   Do not sit still or stand for prolonged periods.   Do not put anything directly under your knees when lying down.  Do not wear constricting clothing or garters on your upper legs.   Exercise your legs to work the fluid back into your blood vessels. This may help the swelling go down.   Wear elastic bandages or support stockings to reduce ankle swelling as directed by your health care provider.   Eat a low-salt diet to reduce fluid if your health care provider recommends it.   Only take medicines as directed by your health care provider. SEEK MEDICAL CARE IF:   Your edema is not responding to treatment.  You have heart, liver, or kidney disease and notice symptoms of edema.  You have edema in your legs that does not improve after elevating them.   You have sudden and unexplained weight gain. SEEK IMMEDIATE MEDICAL CARE IF:   You develop shortness of breath or chest pain.   You cannot breathe when you lie down.  You develop pain, redness, or warmth in the swollen areas.   You have heart, liver,  or kidney disease and suddenly get edema.  You have a fever and your symptoms suddenly get worse. MAKE SURE YOU:   Understand these instructions.  Will watch your condition.  Will get help right away if you are not doing well or get worse. Document Released: 06/27/2005 Document Revised: 11/11/2013 Document Reviewed: 04/19/2013 [facility] Patient Information 2015 [place], [place]. This information is not intended to replace advice given to you by your health care provider. Make sure you discuss any questions you have with  your health care provider. Influenza Virus Vaccine injection (Fluarix) What is this medicine? INFLUENZA VIRUS VACCINE (in floo EN zuh VAHY ruhs vak SEEN) helps to reduce the risk of getting influenza also known as the flu. This medicine may be used for other purposes; ask your health care provider or pharmacist if you have questions. COMMON BRAND NAME(S): Fluarix, Fluzone What should I tell my health care provider before I take this medicine? They need to know if you have any of these conditions: -bleeding disorder like hemophilia -fever or infection -Guillain-Barre syndrome or other neurological problems -immune system problems -infection with the human immunodeficiency virus (HIV) or AIDS -low blood platelet counts -multiple sclerosis -an unusual or allergic reaction to influenza virus vaccine, eggs, chicken proteins, latex, gentamicin, other medicines, foods, dyes or preservatives -pregnant or trying to get pregnant -breast-feeding How should I use this medicine? This vaccine is for injection into a muscle. It is given by a health care professional. A copy of Vaccine Information Statements will be given before each vaccination. Read this sheet carefully each time. The sheet may change frequently. Talk to your pediatrician regarding the use of this medicine in children. Special care may be needed. Overdosage: If you think you have taken too much of this medicine contact a poison control center or emergency room at once. NOTE: This medicine is only for you. Do not share this medicine with others. What if I miss a dose? This does not apply. What may interact with this medicine? -chemotherapy or radiation therapy -medicines that lower your immune system like etanercept, anakinra, infliximab, and adalimumab -medicines that treat or prevent blood clots like warfarin -phenytoin -steroid medicines like prednisone or cortisone -theophylline -vaccines This list may not describe all  possible interactions. Give your health care provider a list of all the medicines, herbs, non-prescription drugs, or dietary supplements you use. Also tell them if you smoke, drink alcohol, or use illegal drugs. Some items may interact with your medicine. What should I watch for while using this medicine? Report any side effects that do not go away within 3 days to your doctor or health care professional. Call your health care provider if any unusual symptoms occur within 6 weeks of receiving this vaccine. You may still catch the flu, but the illness is not usually as bad. You cannot get the flu from the vaccine. The vaccine will not protect against colds or other illnesses that may cause fever. The vaccine is needed every year. What side effects may I notice from receiving this medicine? Side effects that you should report to your doctor or health care professional as soon as possible: -allergic reactions like skin rash, itching or hives, swelling of the face, lips, or tongue Side effects that usually do not require medical attention (report to your doctor or health care professional if they continue or are bothersome): -fever -headache -muscle aches and pains -pain, tenderness, redness, or swelling at site where injected -weak or tired This list may not describe  all possible side effects. Call your doctor for medical advice about side effects. You may report side effects to FDA at 1-800-FDA-1088. Where should I keep my medicine? This vaccine is only given in a clinic, pharmacy, doctor's office, or other health care setting and will not be stored at home. NOTE: This sheet is a summary. It may not cover all possible information. If you have questions about this medicine, talk to your doctor, pharmacist, or health care provider.  2015, Elsevier/Gold Standard. (2008-01-23 09:30:40)

## 2015-03-26 ENCOUNTER — Other Ambulatory Visit: Payer: Self-pay | Admitting: Emergency Medicine

## 2015-03-26 LAB — BASIC METABOLIC PANEL WITH GFR
BUN: 11 mg/dL (ref 7–25)
CO2: 24 mmol/L (ref 20–31)
CREATININE: 0.78 mg/dL (ref 0.60–0.93)
Calcium: 9.3 mg/dL (ref 8.6–10.4)
Chloride: 109 mmol/L (ref 98–110)
GFR, EST AFRICAN AMERICAN: 84 mL/min (ref >=60)
GFR, Est Non African American: 73 mL/min (ref >=60)
GLUCOSE: 102 mg/dL — AB (ref 65–99)
POTASSIUM: 3.7 mmol/L (ref 3.5–5.3)
Sodium: 145 mmol/L (ref 135–146)

## 2015-04-14 ENCOUNTER — Encounter: Payer: Self-pay | Admitting: Emergency Medicine

## 2015-04-21 ENCOUNTER — Encounter: Payer: Self-pay | Admitting: Cardiology

## 2015-04-21 ENCOUNTER — Ambulatory Visit (INDEPENDENT_AMBULATORY_CARE_PROVIDER_SITE_OTHER): Payer: Medicare HMO | Admitting: Cardiology

## 2015-04-21 VITALS — BP 140/68 | HR 63 | Ht 63.5 in | Wt 115.6 lb

## 2015-04-21 DIAGNOSIS — E785 Hyperlipidemia, unspecified: Secondary | ICD-10-CM

## 2015-04-21 DIAGNOSIS — I10 Essential (primary) hypertension: Secondary | ICD-10-CM

## 2015-04-21 DIAGNOSIS — I2510 Atherosclerotic heart disease of native coronary artery without angina pectoris: Secondary | ICD-10-CM | POA: Diagnosis not present

## 2015-04-21 NOTE — Progress Notes (Signed)
[name] Date of Birth: 1934/02/26 Medical Record #[id number]  History of Present Illness: Mrs. [name] is seen for followup CAD. She is status post stenting of the proximal LAD with a bare-metal stent in July of 2013. Ejection fraction was 50% by echocardiogram. When seen in July she was not taking her medicine like she should and BP was elevated 160/100. She is taking her medication now. BP improve. Seen by Dr. Cleta Alberts 2 weeks ago and noted increased swelling in legs. He switched HCTZ to lasix 20 mg daily and swelling now resolved. She denies any SOB or chest pain.   Current Outpatient Prescriptions on File Prior to Visit  Medication Sig Dispense Refill  . alum & mag hydroxide-simeth (MAALOX/MYLANTA) 200-200-20 MG/5ML suspension Take 15 mLs by mouth every 6 (six) hours as needed. As needed for gas.    Marland Kitchen aspirin 81 MG tablet Take 81 mg by mouth daily.    . carvedilol (COREG) 6.25 MG tablet Take 1 tablet (6.25 mg total) by mouth 2 (two) times daily. 180 tablet 3  . cetirizine (ZYRTEC) 10 MG tablet Take 10 mg by mouth daily as needed.    . furosemide (LASIX) 20 MG tablet Take 1 tablet (20 mg total) by mouth daily. 30 tablet 11  . lisinopril (PRINIVIL,ZESTRIL) 5 MG tablet take 1 tablet by mouth once daily 90 tablet 1  . loperamide (IMODIUM A-D) 2 MG tablet Take 2 mg by mouth 4 (four) times daily as needed. As needed for loose stools.    . naphazoline-glycerin (CLEAR EYES) 0.012-0.2 % SOLN Place 1 drop into both eyes every 4 (four) hours as needed. For irritated eyes    . nitroGLYCERIN (NITROSTAT) 0.4 MG SL tablet place 1 tablet under the tongue every 5 minutes if needed for chest pain 25 tablet 1  . potassium chloride (MICRO-K) 10 MEQ CR capsule Take 1 tablet daily 30 capsule 11  . psyllium (METAMUCIL) 58.6 % powder Take 1 packet by mouth daily as needed.      No current facility-administered medications on file prior to visit.    Allergies  Allergen Reactions  . Latex Itching  . Flagyl  [Metronidazole] Rash    Past Medical History  Diagnosis Date  . Hypertension   . CVA (cerebral vascular accident) (HCC)     Right brain CVA 04/2011 - carotid dopplers showed no significant stenosis (tortuous ICA).   . Diverticulosis   . Subdural hematoma (HCC)     2004 s/p craniotomy  . Pyelonephritis 2006  . Abnormal CT of the abdomen     Nodes  resolved  . Inguinal hernia     Right, seen on CT 2008  . Osteoporosis   . Coronary artery disease     a. [place] s/p BMS to prox LAD (with residual moderate mid-LAD & mid-RCA dz for medical therapy) 01/2012 - EF 40% at that time by cath.  . Arthritis   . LV dysfunction     EF 40% by cath 01/2012 with WMA, but 50% by f/u echo same admission    Past Surgical History  Procedure Laterality Date  . Tonsillectomy and adenoidectomy    . Cesarean section      X 2  . Craniotomy  2004  . Total abdominal hysterectomy  1985    FIBROID  . Coronary stent placement  02/07/2012    LAD  . Cardiac catheterization  02/07/2012  . Flexible sigmoidoscopy  11/24/2004    Dr. Mann--Diverticulosis   . Left heart  catheterization with coronary angiogram N/A 02/07/2012    Procedure: LEFT HEART CATHETERIZATION WITH CORONARY ANGIOGRAM;  Surgeon: Peter M Swaziland, MD;  Location: [facility] CATH LAB;  Service: Cardiovascular;  Laterality: N/A;  . Percutaneous coronary stent intervention (pci-s)  02/07/2012    Procedure: PERCUTANEOUS CORONARY STENT INTERVENTION (PCI-S);  Surgeon: Peter M Swaziland, MD;  Location: [facility] CATH LAB;  Service: Cardiovascular;;    History  Smoking status  . Former Smoker  . Types: Cigarettes  . Quit date: 04/10/2003  Smokeless tobacco  . Never Used    Comment: quit 2004    History  Alcohol Use  . Yes    Comment: occasional    Family History  Problem Relation Age of Onset  . Stroke Father     Review of Systems: As noted in history of present illness. She does note some chronic swelling in her left ankle.  All other systems were reviewed and  are negative.  Physical Exam: BP 140/68 mmHg  Pulse 63  Ht 5' 3.5" (1.613 m)  Wt 52.436 kg (115 lb 9.6 oz)  BMI 20.15 kg/m2 She is a pleasant, elderly white female in no acute distress. Her HEENT exam is unremarkable. She has no JVD or bruits. Lungs are clear. Cardiac exam reveals a regular rate and rhythm without gallop, murmur, or click. Abdomen is soft and nontender without masses or bruits. Extremities show good pulses. Trace left ankle edema.  She is alert and oriented x3. Cranial nerves II through XII are intact.  LABORATORY DATA:   Lab Results  Component Value Date   WBC 5.4 03/25/2015   HGB 13.0 03/25/2015   HCT 40.1 03/25/2015   PLT 162 02/08/2012   GLUCOSE 102* 03/25/2015   CHOL 144 01/29/2014   TRIG 69 01/29/2014   HDL 48 01/29/2014   LDLCALC 82 01/29/2014   ALT 15 01/29/2014   AST 15 01/29/2014   NA 145 03/25/2015   K 3.7 03/25/2015   CL 109 03/25/2015   CREATININE 0.78 03/25/2015   BUN 11 03/25/2015   CO2 24 03/25/2015   TSH 2.283 03/25/2015   INR 1.12 02/07/2012   HGBA1C 5.8* 04/13/2011   Ecg today shows NSR with low voltage. Otherwise normal. I have personally reviewed and interpreted this study.  Assessment / Plan: 1. Coronary disease status post stenting of the proximal LAD in July 2013 for unstable angina. This was a bare-metal stent.  She is asymptomatic. Continue baby aspirin daily.  2. Hypertension, blood pressure is under good control. Continue current medication.   3. Hyperlipidemia.   4. history of subdural hematoma following trauma in 2004.  5. History of CVA in October 2012.  6. Ankle edema. Improved on lasix. Continue same. Discussed importance of sodium restriction.  Follow up in 6 months.

## 2015-04-21 NOTE — Patient Instructions (Signed)
Restrict salt intake   Continue your current therapy  I will see you in 6 months.

## 2015-06-29 ENCOUNTER — Other Ambulatory Visit: Payer: Self-pay | Admitting: Emergency Medicine

## 2015-10-21 ENCOUNTER — Telehealth: Payer: Self-pay | Admitting: Radiology

## 2015-10-21 NOTE — Telephone Encounter (Signed)
Patient has made appt with Dr SwazilandJordan for Monday 17th at 4pm, she wants to see you she is itching all over. I have told her to take Zyrtec or Claritin , if not helping come in to see you on Friday or Saturday. (before noon) She thinks she drank some juice which she has had allergy to. No trouble breathing or facial problems / just itching, no rash. To you FYI

## 2015-10-23 ENCOUNTER — Telehealth: Payer: Self-pay | Admitting: Emergency Medicine

## 2015-10-23 NOTE — Telephone Encounter (Signed)
Phone call received after hours. Patient having severe itching and hives. Patient thinks it may be due to mango juice which she has been recently drinking. I advised her to stop the juice to take Benadryl 25 mg every 6 hours as needed and return to clinic in the morning for evaluation. She has had no respiratory distress.

## 2015-10-26 ENCOUNTER — Encounter: Payer: Self-pay | Admitting: Cardiology

## 2015-10-26 ENCOUNTER — Ambulatory Visit (INDEPENDENT_AMBULATORY_CARE_PROVIDER_SITE_OTHER): Payer: Medicare HMO | Admitting: Cardiology

## 2015-10-26 ENCOUNTER — Encounter: Payer: Self-pay | Admitting: *Deleted

## 2015-10-26 VITALS — BP 122/82 | HR 62 | Ht 63.5 in | Wt 110.5 lb

## 2015-10-26 DIAGNOSIS — Z8673 Personal history of transient ischemic attack (TIA), and cerebral infarction without residual deficits: Secondary | ICD-10-CM

## 2015-10-26 DIAGNOSIS — I2510 Atherosclerotic heart disease of native coronary artery without angina pectoris: Secondary | ICD-10-CM | POA: Diagnosis not present

## 2015-10-26 DIAGNOSIS — E785 Hyperlipidemia, unspecified: Secondary | ICD-10-CM

## 2015-10-26 DIAGNOSIS — I10 Essential (primary) hypertension: Secondary | ICD-10-CM | POA: Diagnosis not present

## 2015-10-26 MED ORDER — FUROSEMIDE 40 MG PO TABS: 40.0000 mg | ORAL_TABLET | Freq: Every day | ORAL | Status: DC

## 2015-10-26 MED ORDER — NITROGLYCERIN 0.4 MG SL SUBL: SUBLINGUAL_TABLET | SUBLINGUAL | Status: AC

## 2015-10-26 NOTE — Patient Instructions (Signed)
Increase furosemide to 40 mg daily  Limit salt intake  Wear compression socks- knee high.  I will see you in 6 months

## 2015-10-26 NOTE — Progress Notes (Signed)
[name] Date of Birth: October 16, 1933 Medical Record #[id number]  History of Present Illness: [name] is seen for followup CAD. She is status post stenting of the proximal LAD with a bare-metal stent in July of 2013. Ejection fraction was 50% by echocardiogram. She also has a history of HTN. She is seen with her daughter today. She does complain of swelling in her ankles and feet. She denies any chest pain or SOB. No orthopnea or PND. Swelling is improved with elevation and is down in am.     Current Outpatient Prescriptions on File Prior to Visit  Medication Sig Dispense Refill  . aspirin 81 MG tablet Take 81 mg by mouth daily.    . carvedilol (COREG) 6.25 MG tablet TAKE 1 TABLET BY MOUTH TWICE A DAY 180 tablet 0  . cetirizine (ZYRTEC) 10 MG tablet Take 10 mg by mouth daily as needed.    Marland Kitchen lisinopril (PRINIVIL,ZESTRIL) 5 MG tablet take 1 tablet by mouth once daily 90 tablet 1  . loperamide (IMODIUM A-D) 2 MG tablet Take 2 mg by mouth 4 (four) times daily as needed. As needed for loose stools.    . naphazoline-glycerin (CLEAR EYES) 0.012-0.2 % SOLN Place 1 drop into both eyes every 4 (four) hours as needed. For irritated eyes    . potassium chloride (MICRO-K) 10 MEQ CR capsule Take 1 tablet daily 30 capsule 11  . psyllium (METAMUCIL) 58.6 % powder Take 1 packet by mouth daily as needed.     Marland Kitchen alum & mag hydroxide-simeth (MAALOX/MYLANTA) 200-200-20 MG/5ML suspension Take 15 mLs by mouth every 6 (six) hours as needed. Reported on 10/26/2015     No current facility-administered medications on file prior to visit.    Allergies  Allergen Reactions  . Latex Itching  . Flagyl [Metronidazole] Rash    Past Medical History  Diagnosis Date  . Hypertension   . CVA (cerebral vascular accident) (HCC)     Right brain CVA 04/2011 - carotid dopplers showed no significant stenosis (tortuous ICA).   . Diverticulosis   . Subdural hematoma (HCC)     2004 s/p craniotomy  . Pyelonephritis 2006   . Abnormal CT of the abdomen     Nodes  resolved  . Inguinal hernia     Right, seen on CT 2008  . Osteoporosis   . Coronary artery disease     a. [place] s/p BMS to prox LAD (with residual moderate mid-LAD & mid-RCA dz for medical therapy) 01/2012 - EF 40% at that time by cath.  . Arthritis   . LV dysfunction     EF 40% by cath 01/2012 with WMA, but 50% by f/u echo same admission    Past Surgical History  Procedure Laterality Date  . Tonsillectomy and adenoidectomy    . Cesarean section      X 2  . Craniotomy  2004  . Total abdominal hysterectomy  1985    FIBROID  . Coronary stent placement  02/07/2012    LAD  . Cardiac catheterization  02/07/2012  . Flexible sigmoidoscopy  11/24/2004    Dr. Mann--Diverticulosis   . Left heart catheterization with coronary angiogram N/A 02/07/2012    Procedure: LEFT HEART CATHETERIZATION WITH CORONARY ANGIOGRAM;  Surgeon: Lestat Golob M Swaziland, MD;  Location: [facility] CATH LAB;  Service: Cardiovascular;  Laterality: N/A;  . Percutaneous coronary stent intervention (pci-s)  02/07/2012    Procedure: PERCUTANEOUS CORONARY STENT INTERVENTION (PCI-S);  Surgeon: Hayato Guaman M Swaziland, MD;  Location: [facility]  CATH LAB;  Service: Cardiovascular;;    History  Smoking status  . Former Smoker  . Types: Cigarettes  . Quit date: 04/10/2003  Smokeless tobacco  . Never Used    Comment: quit 2004    History  Alcohol Use  . Yes    Comment: occasional    Family History  Problem Relation Age of Onset  . Stroke Father     Review of Systems: As noted in history of present illness. She does have some incontinence.  All other systems were reviewed and are negative.  Physical Exam: BP 122/82 mmHg  Pulse 62  Ht 5' 3.5" (1.613 m)  Wt 50.122 kg (110 lb 8 oz)  BMI 19.26 kg/m2 She is a pleasant, elderly white female in no acute distress. Her HEENT exam is unremarkable. She has no JVD or bruits. Lungs are clear. Cardiac exam reveals a regular rate and rhythm without gallop, murmur, or  click. Abdomen is soft and nontender without masses or bruits. Extremities show good pulses. 1-2+ bilateral ankle edema.  She is alert and oriented x3. Cranial nerves II through XII are intact.  LABORATORY DATA:   Lab Results  Component Value Date   WBC 5.4 03/25/2015   HGB 13.0 03/25/2015   HCT 40.1 03/25/2015   PLT 162 02/08/2012   GLUCOSE 102* 03/25/2015   CHOL 144 01/29/2014   TRIG 69 01/29/2014   HDL 48 01/29/2014   LDLCALC 82 01/29/2014   ALT 15 01/29/2014   AST 15 01/29/2014   NA 145 03/25/2015   K 3.7 03/25/2015   CL 109 03/25/2015   CREATININE 0.78 03/25/2015   BUN 11 03/25/2015   CO2 24 03/25/2015   TSH 2.283 03/25/2015   INR 1.12 02/07/2012   HGBA1C 5.8* 04/13/2011    Assessment / Plan: 1. Coronary disease status post stenting of the proximal LAD in July 2013 for unstable angina. This was a bare-metal stent.  She is asymptomatic. Continue baby aspirin daily.  2. Hypertension, blood pressure is under good control. Continue current medication.   3. Hyperlipidemia. Controlled.  4. history of subdural hematoma following trauma in 2004.  5. History of CVA in October 2012.  6. Ankle edema. Recommend increased lasix to 40 mg daily. Discussed importance of sodium restriction. Wear support hose daily.   Follow up in 6 months.

## 2015-11-04 DIAGNOSIS — I10 Essential (primary) hypertension: Secondary | ICD-10-CM | POA: Diagnosis not present

## 2015-11-04 DIAGNOSIS — Z0000 Encounter for general adult medical examination without abnormal findings: Secondary | ICD-10-CM | POA: Diagnosis not present

## 2015-11-04 DIAGNOSIS — I2510 Atherosclerotic heart disease of native coronary artery without angina pectoris: Secondary | ICD-10-CM | POA: Diagnosis not present

## 2015-11-07 ENCOUNTER — Ambulatory Visit (INDEPENDENT_AMBULATORY_CARE_PROVIDER_SITE_OTHER): Payer: Medicare HMO | Admitting: Emergency Medicine

## 2015-11-07 ENCOUNTER — Encounter: Payer: Self-pay | Admitting: Emergency Medicine

## 2015-11-07 VITALS — BP 140/84 | HR 65 | Temp 98.4°F | Resp 16 | Ht 60.0 in | Wt 113.2 lb

## 2015-11-07 DIAGNOSIS — L509 Urticaria, unspecified: Secondary | ICD-10-CM | POA: Diagnosis not present

## 2015-11-07 DIAGNOSIS — R413 Other amnesia: Secondary | ICD-10-CM

## 2015-11-07 NOTE — Progress Notes (Addendum)
By signing my name below, I, Stann Ore, attest that this documentation has been prepared under the direction and in the presence of Lesle Chris, MD. Electronically Signed: Stann Ore, Scribe. 11/07/2015 , 4:43 PM .  Patient was seen in room 2 .  Chief Complaint:  Chief Complaint  Patient presents with  . Rash     wheals with itching x 2 weeks    HPI: [name] is a 82 y.o. female who reports to [facility] today complaining of an itchy that was noticed approximately 2 weeks ago. She noticed some welts over her back after drinking tropical fruit juice (guava, mango and pineapple). She's also had some acidic reaction after drinking the juices. She was referred to an allergist (Dr. Cocoa Callas) back in 2013. She takes zyrtec for her seasonal allergies.   She also has swelling in her ankles and feet bilaterally. She was seen by her cardiologist (Dr. Swaziland) 12 days ago, recommended increasing her lasix to  daily, discussed sodium restriction and to wear support hose daily.   She was brought in by her daughter today.   Past Medical History  Diagnosis Date  . Hypertension   . CVA (cerebral vascular accident) (HCC)     Right brain CVA 04/2011 - carotid dopplers showed no significant stenosis (tortuous ICA).   . Diverticulosis   . Subdural hematoma (HCC)     2004 s/p craniotomy  . Pyelonephritis 2006  . Abnormal CT of the abdomen     Nodes  resolved  . Inguinal hernia     Right, seen on CT 2008  . Osteoporosis   . Coronary artery disease     a. [place] s/p BMS to prox LAD (with residual moderate mid-LAD & mid-RCA dz for medical therapy) 01/2012 - EF 40% at that time by cath.  . Arthritis   . LV dysfunction     EF 40% by cath 01/2012 with WMA, but 50% by f/u echo same admission   Past Surgical History  Procedure Laterality Date  . Tonsillectomy and adenoidectomy    . Cesarean section      X 2  . Craniotomy  2004  . Total abdominal hysterectomy  1985    FIBROID  . Coronary  stent placement  02/07/2012    LAD  . Cardiac catheterization  02/07/2012  . Flexible sigmoidoscopy  11/24/2004    Dr. Mann--Diverticulosis   . Left heart catheterization with coronary angiogram N/A 02/07/2012    Procedure: LEFT HEART CATHETERIZATION WITH CORONARY ANGIOGRAM;  Surgeon: Peter M Swaziland, MD;  Location: [facility] CATH LAB;  Service: Cardiovascular;  Laterality: N/A;  . Percutaneous coronary stent intervention (pci-s)  02/07/2012    Procedure: PERCUTANEOUS CORONARY STENT INTERVENTION (PCI-S);  Surgeon: Peter M Swaziland, MD;  Location: [facility] CATH LAB;  Service: Cardiovascular;;   Social History   Social History  . Marital Status: Single    Spouse Name: N/A  . Number of Children: N/A  . Years of Education: N/A   Social History Main Topics  . Smoking status: Former Smoker    Types: Cigarettes    Quit date: 04/10/2003  . Smokeless tobacco: Never Used     Comment: quit 2004  . Alcohol Use: Yes     Comment: occasional  . Drug Use: No  . Sexual Activity: Not Currently    Birth Control/ Protection: Post-menopausal   Other Topics Concern  . 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 She is also work at [employer].   2 grown children, a daughter who lives in [place] area and a son who lives with the patient.   Family History  Problem Relation Age of Onset  . Stroke Father    Allergies  Allergen Reactions  . Latex Itching  . Flagyl [Metronidazole] Rash   Prior to Admission medications   Medication Sig Start Date End Date Taking? Authorizing Provider  alum & mag hydroxide-simeth (MAALOX/MYLANTA) 200-200-20 MG/5ML suspension Take 15 mLs by mouth every 6 (six) hours as needed. Reported on 10/26/2015    Historical Provider, MD  aspirin 81 MG tablet Take 81 mg by mouth daily.    Historical Provider, MD  carvedilol (COREG) 6.25 MG tablet TAKE 1 TABLET BY MOUTH TWICE A DAY 06/30/15   Collene GobbleSteven A Nilay Mangrum, MD  cetirizine (ZYRTEC) 10 MG  tablet Take 10 mg by mouth daily as needed.    Historical Provider, MD  furosemide (LASIX) 40 MG tablet Take 1 tablet (40 mg total) by mouth daily. 10/26/15   Peter M SwazilandJordan, MD  lisinopril (PRINIVIL,ZESTRIL) 5 MG tablet take 1 tablet by mouth once daily 03/27/15   Collene GobbleSteven A Sylvan Lahm, MD  loperamide (IMODIUM A-D) 2 MG tablet Take 2 mg by mouth 4 (four) times daily as needed. As needed for loose stools.    Historical Provider, MD  naphazoline-glycerin (CLEAR EYES) 0.012-0.2 % SOLN Place 1 drop into both eyes every 4 (four) hours as needed. For irritated eyes    Historical Provider, MD  nitroGLYCERIN (NITROSTAT) 0.4 MG SL tablet place 1 tablet under the tongue every 5 minutes if needed for chest pain 10/26/15   Peter M SwazilandJordan, MD  potassium chloride (MICRO-K) 10 MEQ CR capsule Take 1 tablet daily 03/25/15   Collene GobbleSteven A Edan Juday, MD  psyllium (METAMUCIL) 58.6 % powder Take 1 packet by mouth daily as needed.     Historical Provider, MD     ROS:  Constitutional: negative for fever, chills, night sweats, weight changes, or fatigue  HEENT: negative for vision changes, hearing loss, congestion, rhinorrhea, ST, epistaxis, or sinus pressure Cardiovascular: negative for chest pain or palpitations; positive for ankle swelling Respiratory: negative for hemoptysis, wheezing, shortness of breath, or cough Abdominal: negative for abdominal pain, nausea, vomiting, diarrhea, or constipation Dermatological: positive for rash Neurologic: negative for headache, dizziness, or syncope All other systems reviewed and are otherwise negative with the exception to those above and in the HPI.  PHYSICAL EXAM: Filed Vitals:   11/07/15 1607  BP: 140/84  Pulse: 65  Temp: 98.4 F (36.9 C)  Resp: 16   Body mass index is 22.11 kg/(m^2).   General: Alert and cooperative HEENT:  Normocephalic, atraumatic, oropharynx patent. Eye: Nonie HoyerOMI, [facility] Cardiovascular:  Regular rate and rhythm, no rubs murmurs or gallops.  No Carotid bruits,  radial pulse intact. No pedal edema.  Respiratory: Clear to auscultation bilaterally.  No wheezes, rales, or rhonchi.  No cyanosis, no use of accessory musculature Abdominal: No organomegaly, abdomen is soft and non-tender, positive bowel sounds. No masses. Musculoskeletal: Lower extremities 4+ edema, 2 inches approximately below knee Skin: no hives Neurologic: Facial musculature symmetric. Psychiatric: Patient acts appropriately throughout our interaction.  Lymphatic: No cervical or submandibular lymphadenopathy Genitourinary/Anorectal: No acute findings  LABS:   EKG/XRAY:     ASSESSMENT/PLAN She was advised to take Zyrtec every day. She is advised to decrease her juice intake. The daughter feels like she is having memory issue but she  scored 28 out of 30 on her Mini-Mental status exam. Referral made to neurology for their evaluation. If she continues to have episodes of hives referred to an allergist.I personally performed the services described in this documentation, which was scribed in my presence. The recorded information has been reviewed and is accurate she had significant swelling in her legs but had not gotten her prescription for Lasix filled yet.I personally performed the services described in this documentation, which was scribed in my presence. The recorded information has been reviewed and is accurate.Michaell Cowing  sideeffects, risk and benefits, and alternatives of medications d/w patient. Patient is aware that all medications have potential sideeffects and we are unable to predict every sideeffect or drug-drug interaction that may occur.  Lesle Chris MD 11/07/2015 4:45 PM

## 2015-11-07 NOTE — Patient Instructions (Signed)
Please take your Zyrtec every day. I have made you an appointment to see the neurologist regarding memory issues.

## 2016-02-01 ENCOUNTER — Encounter (HOSPITAL_COMMUNITY): Payer: Self-pay | Admitting: Emergency Medicine

## 2016-02-01 ENCOUNTER — Ambulatory Visit (HOSPITAL_COMMUNITY)
Admission: EM | Admit: 2016-02-01 | Discharge: 2016-02-01 | Disposition: A | Discharge status: Home / Self Care | Payer: Medicare HMO | Attending: Internal Medicine | Admitting: Internal Medicine

## 2016-02-01 DIAGNOSIS — R600 Localized edema: Secondary | ICD-10-CM | POA: Insufficient documentation

## 2016-02-01 DIAGNOSIS — I5023 Acute on chronic systolic (congestive) heart failure: Secondary | ICD-10-CM

## 2016-02-01 DIAGNOSIS — M1990 Unspecified osteoarthritis, unspecified site: Secondary | ICD-10-CM | POA: Diagnosis not present

## 2016-02-01 DIAGNOSIS — I10 Essential (primary) hypertension: Secondary | ICD-10-CM | POA: Insufficient documentation

## 2016-02-01 DIAGNOSIS — I2510 Atherosclerotic heart disease of native coronary artery without angina pectoris: Secondary | ICD-10-CM | POA: Insufficient documentation

## 2016-02-01 DIAGNOSIS — Z7982 Long term (current) use of aspirin: Secondary | ICD-10-CM | POA: Insufficient documentation

## 2016-02-01 DIAGNOSIS — Z8673 Personal history of transient ischemic attack (TIA), and cerebral infarction without residual deficits: Secondary | ICD-10-CM | POA: Insufficient documentation

## 2016-02-01 DIAGNOSIS — Z87891 Personal history of nicotine dependence: Secondary | ICD-10-CM | POA: Insufficient documentation

## 2016-02-01 LAB — BASIC METABOLIC PANEL
Anion gap: 7 (ref 5–15)
BUN: 12 mg/dL (ref 6–20)
CO2: 28 mmol/L (ref 22–32)
Calcium: 9.5 mg/dL (ref 8.9–10.3)
Chloride: 107 mmol/L (ref 101–111)
Creatinine, Ser: 0.86 mg/dL (ref 0.44–1.00)
GFR calc Af Amer: >60 mL/min (ref >60)
GFR calc non Af Amer: >60 mL/min (ref >60)
Glucose, Bld: 95 mg/dL (ref 65–99)
Potassium: 3.3 mmol/L — ABNORMAL LOW (ref 3.5–5.1)
Sodium: 142 mmol/L (ref 135–145)

## 2016-02-01 LAB — TSH: TSH: 1.817 u[IU]/mL (ref 0.350–4.500)

## 2016-02-01 NOTE — ED Triage Notes (Signed)
The patient presented to the [facility] with a complaint of bilateral pedal edema that has increased over the last 2 weeks. The patient's family stated that they are not sure whether the patient consistently takes her diuretic.

## 2016-02-01 NOTE — Discharge Instructions (Signed)
Please take half of your Coreg (carvedilol) twice daily for the next week until you see Dr. Swaziland.  If you begin to feel bad you may resume taking the full tablet twice daily as you were before today's visit.    Also, you may take an extra tablet of potassium tomorrow as your blood work showed slightly low potassium.

## 2016-02-01 NOTE — ED Provider Notes (Signed)
[facility]    CSN: [id number] Arrival date & time: 02/01/16  1520  First Provider Contact:  First MD Initiated Contact with Patient 02/01/16 1621        History   Chief Complaint Chief Complaint  Patient presents with  . Foot Swelling    HPI [name] is a 82 y.o. female.   The patient presents to urgent care complaining of lower extremity edema that has been a wxing and waning recurrent problem for the last 2 months.  Lately her leg swelling does not decrease by morning despite elevating her legs at night.  Also, she reports that her cardiologist increased her dose of Lasix but she has seen little improvement.  She denies shortness of breath, paroxysmal nocturnal dyspnea, chest pain, or cough.  She also denies swelling in her hands or face.   The history is provided by the patient.    Past Medical History:  Diagnosis Date  . Abnormal CT of the abdomen    Nodes  resolved  . Arthritis   . Coronary artery disease    a. [place] s/p BMS to prox LAD (with residual moderate mid-LAD & mid-RCA dz for medical therapy) 01/2012 - EF 40% at that time by cath.  . CVA (cerebral vascular accident) (HCC)    Right brain CVA 04/2011 - carotid dopplers showed no significant stenosis (tortuous ICA).   . Diverticulosis   . Hypertension   . Inguinal hernia    Right, seen on CT 2008  . LV dysfunction    EF 40% by cath 01/2012 with WMA, but 50% by f/u echo same admission  . Osteoporosis   . Pyelonephritis 2006  . Subdural hematoma (HCC)    2004 s/p craniotomy    Patient Active Problem List   Diagnosis Date Noted  . Coronary artery disease   . Hyperlipidemia 04/13/2012  . History of CVA (cerebrovascular accident) 02/08/2012  . Personal history of subdural hematoma 02/08/2012  . Hypertension   . Diverticulosis   . Osteoporosis     Past Surgical History:  Procedure Laterality Date  . CARDIAC CATHETERIZATION  02/07/2012  . CESAREAN SECTION     X 2  . CORONARY STENT  PLACEMENT  02/07/2012   LAD  . CRANIOTOMY  2004  . FLEXIBLE SIGMOIDOSCOPY  11/24/2004   Dr. Mann--Diverticulosis   . LEFT HEART CATHETERIZATION WITH CORONARY ANGIOGRAM N/A 02/07/2012   Procedure: LEFT HEART CATHETERIZATION WITH CORONARY ANGIOGRAM;  Surgeon: Peter M Swaziland, MD;  Location: [facility] CATH LAB;  Service: Cardiovascular;  Laterality: N/A;  . PERCUTANEOUS CORONARY STENT INTERVENTION (PCI-S)  02/07/2012   Procedure: PERCUTANEOUS CORONARY STENT INTERVENTION (PCI-S);  Surgeon: Peter M Swaziland, MD;  Location: [facility] CATH LAB;  Service: Cardiovascular;;  . TONSILLECTOMY AND ADENOIDECTOMY    . TOTAL ABDOMINAL HYSTERECTOMY  1985   FIBROID    OB History    No data available       Home Medications    Prior to Admission medications   Medication Sig Start Date End Date Taking? Authorizing Provider  aspirin 81 MG tablet Take 81 mg by mouth daily.   Yes Historical Provider, MD  carvedilol (COREG) 6.25 MG tablet TAKE 1 TABLET BY MOUTH TWICE A DAY 06/30/15  Yes Collene Gobble, MD  furosemide (LASIX) 40 MG tablet Take 1 tablet (40 mg total) by mouth daily. 10/26/15  Yes Peter M [place]il[place], MD  lisinopril (PRINIVIL,ZESTRIL) 5 MG tablet take 1 tablet by mouth once daily 03/27/15  Yes Viviann Spare  A Daub, MD  potassium chloride (MICRO-K) 10 MEQ CR capsule Take 1 tablet daily 03/25/15  Yes Collene Gobble, MD  alum & mag hydroxide-simeth (MAALOX/MYLANTA) 200-200-20 MG/5ML suspension Take 15 mLs by mouth every 6 (six) hours as needed. Reported on 10/26/2015    Historical Provider, MD  cetirizine (ZYRTEC) 10 MG tablet Take 10 mg by mouth daily as needed. Reported on 11/07/2015    Historical Provider, MD  loperamide (IMODIUM A-D) 2 MG tablet Take 2 mg by mouth 4 (four) times daily as needed. As needed for loose stools.    Historical Provider, MD  naphazoline-glycerin (CLEAR EYES) 0.012-0.2 % SOLN Place 1 drop into both eyes every 4 (four) hours as needed. For irritated eyes    Historical Provider, MD  nitroGLYCERIN (NITROSTAT)  0.4 MG SL tablet place 1 tablet under the tongue every 5 minutes if needed for chest pain 10/26/15   Peter M [place]il[place], MD  psyllium (METAMUCIL) 58.6 % powder Take 1 packet by mouth daily as needed.     Historical Provider, MD    Family History Family History  Problem Relation Age of Onset  . Stroke Father     Social History Social History  Substance Use Topics  . Smoking status: Former Smoker    Types: Cigarettes    Quit date: 04/10/2003  . Smokeless tobacco: Never Used     Comment: quit 2004  . Alcohol use Yes     Comment: occasional     Allergies   Latex and Flagyl [metronidazole]   Review of Systems Review of Systems  Constitutional: Negative for fever.  Respiratory: Negative for cough, chest tightness and shortness of breath.   Cardiovascular: Positive for leg swelling. Negative for chest pain and palpitations.  Gastrointestinal: Negative for abdominal distention and abdominal pain.  Genitourinary: Negative for difficulty urinating.     Physical Exam Triage Vital Signs ED Triage Vitals  Enc Vitals Group     BP 02/01/16 1641 140/88     Pulse Rate 02/01/16 1641 60     Resp 02/01/16 1641 14     Temp 02/01/16 1641 97.9 F (36.6 C)     Temp Source 02/01/16 1641 Oral     SpO2 02/01/16 1641 95 %     Weight --      Height --      Head Circumference --      Peak Flow --      Pain Score 02/01/16 1652 0     Pain Loc --      Pain Edu? --      Excl. in GC? --    No data found.   Updated Vital Signs BP 140/88 (BP Location: Right Arm)   Pulse 60   Temp 97.9 F (36.6 C) (Oral)   Resp 14   SpO2 95%   Visual Acuity Right Eye Distance:   Left Eye Distance:   Bilateral Distance:    Right Eye Near:   Left Eye Near:    Bilateral Near:     Physical Exam  Constitutional: She is oriented to person, place, and time. She appears well-developed and well-nourished. No distress.  Neck: No JVD present.  Cardiovascular: Normal rate and regular rhythm.  Exam reveals no  gallop and no friction rub.   No murmur heard. Heart sounds slightly distant  Pulmonary/Chest: Effort normal and breath sounds normal.  Musculoskeletal: She exhibits edema (to upper calves bilaterally).  Neurological: She is alert and oriented to person, place, and time.  Skin: Skin is warm and dry.  Psychiatric: Cognition and memory are impaired.  Nursing note and vitals reviewed.    UC Treatments / Results  Labs (all labs ordered are listed, but only abnormal results are displayed) Labs Reviewed  BASIC METABOLIC PANEL - Abnormal; Notable for the following:       Result Value   Potassium 3.3 (*)    All other components within normal limits  TSH    EKG  EKG Interpretation None       Radiology No results found.  Procedures Procedures (including critical care time)  Medications Ordered in UC Medications - No data to display   Initial Impression / Assessment and Plan / UC Course  I have reviewed the triage vital signs and the nursing notes.  Pertinent labs & imaging results that were available during my care of the patient were reviewed by me and considered in my medical decision making (see chart for details).  Clinical Course    Records reviewed.  BMP and TSH ordered and reviewed.  Echo from 2013 shows relatively well-preserved EF 50%.  Given lack of pulmonary edema, orthopnea or PND, I believe the patient has some diastolic heart failure in addition to mild systolic failure.  Thus I have decided to decrease her beta blocker. Increasing heart rate may promote less venous stasis.  Upside potential is not large, but neither is downside (patient has a history of CAD s/p BMS placement in 2013 but it is not clear that she has had an MI).  Encouraged close follow up with her cardiologist (1 week).    Final Clinical Impressions(s) / UC Diagnoses   Final diagnoses:  None    New Prescriptions New Prescriptions   No medications on file     Arnaldo Natal,  MD 02/02/16 0202

## 2016-02-02 ENCOUNTER — Ambulatory Visit: Payer: Medicare HMO

## 2016-02-03 ENCOUNTER — Ambulatory Visit: Payer: Medicare HMO

## 2016-02-17 ENCOUNTER — Encounter (INDEPENDENT_AMBULATORY_CARE_PROVIDER_SITE_OTHER): Payer: Self-pay

## 2016-02-17 ENCOUNTER — Encounter: Payer: Self-pay | Admitting: Cardiology

## 2016-02-17 ENCOUNTER — Ambulatory Visit (INDEPENDENT_AMBULATORY_CARE_PROVIDER_SITE_OTHER): Payer: Medicare HMO | Admitting: Cardiology

## 2016-02-17 DIAGNOSIS — R609 Edema, unspecified: Secondary | ICD-10-CM

## 2016-02-17 DIAGNOSIS — I10 Essential (primary) hypertension: Secondary | ICD-10-CM | POA: Diagnosis not present

## 2016-02-17 DIAGNOSIS — I2510 Atherosclerotic heart disease of native coronary artery without angina pectoris: Secondary | ICD-10-CM | POA: Diagnosis not present

## 2016-02-17 DIAGNOSIS — Z8673 Personal history of transient ischemic attack (TIA), and cerebral infarction without residual deficits: Secondary | ICD-10-CM | POA: Diagnosis not present

## 2016-02-17 DIAGNOSIS — Z9861 Coronary angioplasty status: Secondary | ICD-10-CM

## 2016-02-17 MED ORDER — METOLAZONE 2.5 MG PO TABS: 2.5000 mg | ORAL_TABLET | Freq: Every day | ORAL | 0 refills | Status: DC

## 2016-02-17 MED ORDER — METOLAZONE 2.5 MG PO TABS: 2.5000 mg | ORAL_TABLET | ORAL | 0 refills | Status: DC

## 2016-02-17 NOTE — Addendum Note (Signed)
Addended by: Kandice RobinsonsYOUNG, Mery Guadalupe T on: 02/17/2016 10:32 AM   Modules accepted: Orders

## 2016-02-17 NOTE — Patient Instructions (Addendum)
Medication Instructions:  1. CONTINUE taking Coreg half tab by mouth twice a day 2. START Metolazone 2.5mg ; TAKE 1 tab by mouth 30 minutes prior to taking Lasix  3. TAKE Metolazone every other day for 3 doses 4. TAKE a extra Potassium when taking Metolazone.  Labwork: Your physician recommends that you return for lab work in: BMP 1-2 weeks on the same day as ECHO    Testing/Procedures: Your physician has requested that you have an echocardiogram. Echocardiography is a painless test that uses sound waves to create images of your heart. It provides your doctor with information about the size and shape of your heart and how well your heart's chambers and valves are working. This procedure takes approximately one hour. There are no restrictions for this procedure.   Follow-Up: Your physician recommends that you schedule a follow-up appointment AFTER ECHO is completed with Corine ShelterLUKE KILROY.  Any Other Special Instructions Will Be Listed Below (If Applicable).     If you need a refill on your cardiac medications before your next appointment, please call your pharmacy.

## 2016-02-17 NOTE — Assessment & Plan Note (Signed)
[place] s/p BMS to prox LAD (with residual moderate mid-LAD & mid-RCA dz for medical therapy) 01/2012 - EF 40% at that time by cath.

## 2016-02-17 NOTE — Assessment & Plan Note (Signed)
Controlled.  

## 2016-02-17 NOTE — Assessment & Plan Note (Signed)
Recent increase in her chronic LE edema

## 2016-02-17 NOTE — Assessment & Plan Note (Signed)
Rt brain 2012

## 2016-02-17 NOTE — Progress Notes (Signed)
02/17/2016 Jill FischerMargaret A Chapman   12/17/1933  [id number]  Primary Physician Lucilla EdinAUB, STEVE A, MD Primary Cardiologist: Dr SwazilandJordan  HPI:  82 y/o AA female with a history of CAD, status post stenting of the proximal LAD with a bare-metal stent in July of 2013. Her ejection fraction was 50% by echocardiogram. She also has a history of HTN. She is here today to f/u on a recent visit 02/02/16 to Urgent Care for worsening of her chronic LE edema. Her daughter from [place] accompanied her. At Urgent care her Coreg was decreased. The pt says this has not helped. She denies any orthopnea, PND, or unusual DOE.    Current Outpatient Prescriptions  Medication Sig Dispense Refill  . alum & mag hydroxide-simeth (MAALOX/MYLANTA) 200-200-20 MG/5ML suspension Take 15 mLs by mouth every 6 (six) hours as needed. Reported on 10/26/2015    . aspirin 81 MG tablet Take 81 mg by mouth daily.    . carvedilol (COREG) 6.25 MG tablet TAKE 1 TABLET BY MOUTH TWICE A DAY 180 tablet 0  . cetirizine (ZYRTEC) 10 MG tablet Take 10 mg by mouth daily as needed. Reported on 11/07/2015    . furosemide (LASIX) 40 MG tablet Take 1 tablet (40 mg total) by mouth daily. 90 tablet 3  . lisinopril (PRINIVIL,ZESTRIL) 5 MG tablet take 1 tablet by mouth once daily 90 tablet 1  . loperamide (IMODIUM A-D) 2 MG tablet Take 2 mg by mouth 4 (four) times daily as needed. As needed for loose stools.    . naphazoline-glycerin (CLEAR EYES) 0.012-0.2 % SOLN Place 1 drop into both eyes every 4 (four) hours as needed. For irritated eyes    . nitroGLYCERIN (NITROSTAT) 0.4 MG SL tablet place 1 tablet under the tongue every 5 minutes if needed for chest pain 25 tablet 6  . potassium chloride (MICRO-K) 10 MEQ CR capsule Take 1 tablet daily 30 capsule 11  . psyllium (METAMUCIL) 58.6 % powder Take 1 packet by mouth daily as needed.      No current facility-administered medications for this visit.     Allergies  Allergen Reactions  . Latex Itching  . Flagyl  [Metronidazole] Rash    Social History   Social History  . Marital status: Single    Spouse name: N/A  . Number of children: N/A  . Years of education: N/A   Occupational History  . Not on file.   Social History Main Topics  . Smoking status: Former Smoker    Types: Cigarettes    Quit date: 04/10/2003  . Smokeless tobacco: Never Used     Comment: quit 2004  . Alcohol use Yes     Comment: occasional  . Drug use: No  . Sexual activity: Not Currently    Birth control/ protection: Post-menopausal   Other Topics Concern  . 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 She is also work at [employer].   2 grown children, a daughter who lives in [place] area and a son who lives with the patient.     Review of Systems: General: negative for chills, fever, night sweats or weight changes.  Cardiovascular: negative for chest pain, dyspnea on exertion, edema, orthopnea, palpitations, paroxysmal nocturnal dyspnea or shortness of breath Dermatological: negative for rash Respiratory: negative for cough or wheezing Urologic: negative for hematuria Abdominal: negative for nausea, vomiting, diarrhea, bright red blood per rectum, melena, or  hematemesis Neurologic: negative for visual changes, syncope, or dizziness All other systems reviewed and are otherwise negative except as noted above.    Blood pressure 124/90, pulse 63, height  (1.6 m), weight 116 lb 2 oz (52.7 kg), SpO2 99 %.  General appearance: alert, cooperative and no distress Neck: no carotid bruit and no JVD Lungs: decreased Lt base Heart: regular rate and rhythm Extremities: 2+ bilateral LE edema to her knees Skin: Skin color, texture, turgor normal. No rashes or lesions Neurologic: Grossly normal  EKG NSR, SB-57  ASSESSMENT AND PLAN:   CAD S/P PCI 2013 [place] s/p BMS to prox LAD (with residual moderate mid-LAD & mid-RCA dz for medical therapy)  01/2012 - EF 40% at that time by cath.  History of CVA (cerebrovascular accident) Rt brain 2012  Edema Recent increase in her chronic LE edema  Essential hypertension Controlled   PLAN  The pt lives alone but has help during the week. She was a little confused about her medical history but apparently has been complaint with her medications. I agree with lower dose of Coreg, she'll continue that. I'll add Zaroxolyn 2.5 mg QOD for three doses. I also suggested support stockings. She'll be set up for an echo and a f/u in a few weeks.   Corine Shelter PA-C 02/17/2016 9:03 AM

## 2016-02-19 ENCOUNTER — Ambulatory Visit: Payer: Medicare HMO

## 2016-02-25 ENCOUNTER — Encounter (HOSPITAL_COMMUNITY): Payer: Self-pay | Admitting: Radiology

## 2016-02-25 NOTE — Progress Notes (Signed)
Confirmed echo appointment time, date, and location. Not sure if patient really understood.

## 2016-02-29 ENCOUNTER — Ambulatory Visit (HOSPITAL_COMMUNITY): Payer: Medicare HMO

## 2016-03-09 ENCOUNTER — Ambulatory Visit (HOSPITAL_COMMUNITY): Payer: Medicare HMO | Attending: Cardiovascular Disease

## 2016-03-10 ENCOUNTER — Ambulatory Visit: Payer: Medicare HMO | Admitting: Cardiology

## 2016-03-10 ENCOUNTER — Encounter: Payer: Self-pay | Admitting: *Deleted

## 2016-03-17 ENCOUNTER — Other Ambulatory Visit: Payer: Self-pay | Admitting: Emergency Medicine

## 2016-03-29 ENCOUNTER — Other Ambulatory Visit: Payer: Self-pay | Admitting: Emergency Medicine

## 2016-03-29 ENCOUNTER — Other Ambulatory Visit (HOSPITAL_COMMUNITY): Payer: Medicare HMO

## 2016-04-18 ENCOUNTER — Other Ambulatory Visit: Payer: Self-pay

## 2016-04-18 ENCOUNTER — Ambulatory Visit (HOSPITAL_COMMUNITY): Payer: Medicare HMO | Attending: Cardiovascular Disease

## 2016-04-18 DIAGNOSIS — I2510 Atherosclerotic heart disease of native coronary artery without angina pectoris: Secondary | ICD-10-CM | POA: Diagnosis not present

## 2016-04-18 DIAGNOSIS — I10 Essential (primary) hypertension: Secondary | ICD-10-CM | POA: Diagnosis not present

## 2016-04-18 DIAGNOSIS — I071 Rheumatic tricuspid insufficiency: Secondary | ICD-10-CM | POA: Insufficient documentation

## 2016-04-18 DIAGNOSIS — R609 Edema, unspecified: Secondary | ICD-10-CM | POA: Insufficient documentation

## 2016-04-18 DIAGNOSIS — I5189 Other ill-defined heart diseases: Secondary | ICD-10-CM | POA: Insufficient documentation

## 2016-04-18 DIAGNOSIS — I639 Cerebral infarction, unspecified: Secondary | ICD-10-CM | POA: Diagnosis not present

## 2016-04-20 ENCOUNTER — Telehealth: Payer: Self-pay | Admitting: *Deleted

## 2016-04-20 NOTE — Telephone Encounter (Signed)
Left message for pt to call.

## 2016-04-20 NOTE — Telephone Encounter (Signed)
-----   Message from Abelino DerrickLuke K Kilroy, [place]PA-C sent at 04/19/2016  9:51 AM EDT ----- Please let pt know her echo looked OK- good LVF. It did suggest she was volume overloaded. Please ask her how she is doing after Zaroxolyn last week.  Corine ShelterLUKE KILROY PA-C 04/19/2016 9:51 AM

## 2016-04-22 NOTE — Telephone Encounter (Signed)
Spoke with pt, she does not remember taking the metolazone but reports feeling fine with no SOB and no edema. She was advised to call with any problems with the above. Patient voiced understanding and took down our phone number.

## 2016-05-11 ENCOUNTER — Other Ambulatory Visit: Payer: Self-pay | Admitting: Emergency Medicine

## 2016-05-31 DIAGNOSIS — R402411 Glasgow coma scale score 13-15, in the field [EMT or ambulance]: Secondary | ICD-10-CM | POA: Diagnosis not present

## 2016-06-01 ENCOUNTER — Observation Stay (HOSPITAL_COMMUNITY): Payer: Medicare HMO

## 2016-06-01 ENCOUNTER — Emergency Department (HOSPITAL_COMMUNITY): Payer: Medicare HMO

## 2016-06-01 ENCOUNTER — Observation Stay (HOSPITAL_COMMUNITY)
Admission: EM | Admit: 2016-06-01 | Discharge: 2016-06-03 | Disposition: A | Discharge status: Home / Self Care | Payer: Medicare HMO | Attending: Internal Medicine | Admitting: Internal Medicine

## 2016-06-01 ENCOUNTER — Other Ambulatory Visit (HOSPITAL_COMMUNITY): Payer: Medicare HMO

## 2016-06-01 ENCOUNTER — Encounter (HOSPITAL_COMMUNITY): Payer: Self-pay | Admitting: General Practice

## 2016-06-01 DIAGNOSIS — N390 Urinary tract infection, site not specified: Secondary | ICD-10-CM | POA: Diagnosis present

## 2016-06-01 DIAGNOSIS — I6623 Occlusion and stenosis of bilateral posterior cerebral arteries: Secondary | ICD-10-CM | POA: Diagnosis not present

## 2016-06-01 DIAGNOSIS — G451 Carotid artery syndrome (hemispheric): Secondary | ICD-10-CM | POA: Diagnosis not present

## 2016-06-01 DIAGNOSIS — R29810 Facial weakness: Secondary | ICD-10-CM | POA: Insufficient documentation

## 2016-06-01 DIAGNOSIS — G458 Other transient cerebral ischemic attacks and related syndromes: Secondary | ICD-10-CM | POA: Diagnosis not present

## 2016-06-01 DIAGNOSIS — B9620 Unspecified Escherichia coli [E. coli] as the cause of diseases classified elsewhere: Secondary | ICD-10-CM | POA: Insufficient documentation

## 2016-06-01 DIAGNOSIS — Z955 Presence of coronary angioplasty implant and graft: Secondary | ICD-10-CM | POA: Insufficient documentation

## 2016-06-01 DIAGNOSIS — Z8673 Personal history of transient ischemic attack (TIA), and cerebral infarction without residual deficits: Secondary | ICD-10-CM

## 2016-06-01 DIAGNOSIS — R600 Localized edema: Secondary | ICD-10-CM

## 2016-06-01 DIAGNOSIS — E876 Hypokalemia: Secondary | ICD-10-CM | POA: Diagnosis present

## 2016-06-01 DIAGNOSIS — R4781 Slurred speech: Secondary | ICD-10-CM | POA: Insufficient documentation

## 2016-06-01 DIAGNOSIS — E785 Hyperlipidemia, unspecified: Secondary | ICD-10-CM | POA: Diagnosis not present

## 2016-06-01 DIAGNOSIS — Z6841 Body Mass Index (BMI) 40.0 and over, adult: Secondary | ICD-10-CM | POA: Insufficient documentation

## 2016-06-01 DIAGNOSIS — R531 Weakness: Secondary | ICD-10-CM | POA: Insufficient documentation

## 2016-06-01 DIAGNOSIS — Z7982 Long term (current) use of aspirin: Secondary | ICD-10-CM | POA: Insufficient documentation

## 2016-06-01 DIAGNOSIS — G459 Transient cerebral ischemic attack, unspecified: Principal | ICD-10-CM | POA: Diagnosis present

## 2016-06-01 DIAGNOSIS — Q211 Atrial septal defect: Secondary | ICD-10-CM | POA: Insufficient documentation

## 2016-06-01 DIAGNOSIS — Z7902 Long term (current) use of antithrombotics/antiplatelets: Secondary | ICD-10-CM | POA: Insufficient documentation

## 2016-06-01 DIAGNOSIS — I639 Cerebral infarction, unspecified: Secondary | ICD-10-CM

## 2016-06-01 DIAGNOSIS — I10 Essential (primary) hypertension: Secondary | ICD-10-CM | POA: Diagnosis not present

## 2016-06-01 DIAGNOSIS — Z87891 Personal history of nicotine dependence: Secondary | ICD-10-CM | POA: Insufficient documentation

## 2016-06-01 DIAGNOSIS — I2510 Atherosclerotic heart disease of native coronary artery without angina pectoris: Secondary | ICD-10-CM | POA: Diagnosis not present

## 2016-06-01 DIAGNOSIS — I6601 Occlusion and stenosis of right middle cerebral artery: Secondary | ICD-10-CM

## 2016-06-01 DIAGNOSIS — N3000 Acute cystitis without hematuria: Secondary | ICD-10-CM | POA: Diagnosis not present

## 2016-06-01 DIAGNOSIS — I651 Occlusion and stenosis of basilar artery: Secondary | ICD-10-CM | POA: Diagnosis not present

## 2016-06-01 DIAGNOSIS — R609 Edema, unspecified: Secondary | ICD-10-CM | POA: Diagnosis present

## 2016-06-01 DIAGNOSIS — Z79899 Other long term (current) drug therapy: Secondary | ICD-10-CM | POA: Diagnosis not present

## 2016-06-01 DIAGNOSIS — R29818 Other symptoms and signs involving the nervous system: Secondary | ICD-10-CM | POA: Diagnosis not present

## 2016-06-01 LAB — I-STAT CHEM 8, ED
BUN: 11 mg/dL (ref 6–20)
CALCIUM ION: 1.26 mmol/L (ref 1.15–1.40)
CHLORIDE: 102 mmol/L (ref 101–111)
Creatinine, Ser: 1.2 mg/dL — ABNORMAL HIGH (ref 0.44–1.00)
GLUCOSE: 103 mg/dL — AB (ref 65–99)
HCT: 45 % (ref 36.0–46.0)
Hemoglobin: 15.3 g/dL — ABNORMAL HIGH (ref 12.0–15.0)
Potassium: 3.3 mmol/L — ABNORMAL LOW (ref 3.5–5.1)
Sodium: 144 mmol/L (ref 135–145)
TCO2: 28 mmol/L (ref 0–100)

## 2016-06-01 LAB — CBC
HEMATOCRIT: 44.5 % (ref 36.0–46.0)
HEMOGLOBIN: 15 g/dL (ref 12.0–15.0)
MCH: 31.6 pg (ref 26.0–34.0)
MCHC: 33.7 g/dL (ref 30.0–36.0)
MCV: 93.7 fL (ref 78.0–100.0)
Platelets: 152 10*3/uL (ref 150–400)
RBC: 4.75 MIL/uL (ref 3.87–5.11)
RDW: 13.7 % (ref 11.5–15.5)
WBC: 6.7 10*3/uL (ref 4.0–10.5)

## 2016-06-01 LAB — COMPREHENSIVE METABOLIC PANEL
ALK PHOS: 70 U/L (ref 38–126)
ALT: 11 U/L — AB (ref 14–54)
AST: 20 U/L (ref 15–41)
Albumin: 3.9 g/dL (ref 3.5–5.0)
Anion gap: 8 (ref 5–15)
BUN: 11 mg/dL (ref 6–20)
CALCIUM: 10 mg/dL (ref 8.9–10.3)
CO2: 29 mmol/L (ref 22–32)
CREATININE: 1.08 mg/dL — AB (ref 0.44–1.00)
Chloride: 105 mmol/L (ref 101–111)
GFR, EST AFRICAN AMERICAN: 55 mL/min — AB (ref >60)
GFR, EST NON AFRICAN AMERICAN: 47 mL/min — AB (ref >60)
Glucose, Bld: 100 mg/dL — ABNORMAL HIGH (ref 65–99)
Potassium: 3.2 mmol/L — ABNORMAL LOW (ref 3.5–5.1)
Sodium: 142 mmol/L (ref 135–145)
Total Bilirubin: 0.8 mg/dL (ref 0.3–1.2)
Total Protein: 7.3 g/dL (ref 6.5–8.1)

## 2016-06-01 LAB — URINE MICROSCOPIC-ADD ON

## 2016-06-01 LAB — URINALYSIS, ROUTINE W REFLEX MICROSCOPIC
BILIRUBIN URINE: NEGATIVE
Glucose, UA: NEGATIVE mg/dL
Hgb urine dipstick: NEGATIVE
Ketones, ur: NEGATIVE mg/dL
NITRITE: POSITIVE — AB
PROTEIN: NEGATIVE mg/dL
SPECIFIC GRAVITY, URINE: 1.015 (ref 1.005–1.030)
pH: 5 (ref 5.0–8.0)

## 2016-06-01 LAB — RAPID URINE DRUG SCREEN, HOSP PERFORMED
AMPHETAMINES: NOT DETECTED
BARBITURATES: NOT DETECTED
Benzodiazepines: NOT DETECTED
Cocaine: NOT DETECTED
Opiates: NOT DETECTED
Tetrahydrocannabinol: NOT DETECTED

## 2016-06-01 LAB — APTT: aPTT: 38 seconds — ABNORMAL HIGH (ref 24–36)

## 2016-06-01 LAB — DIFFERENTIAL
Basophils Absolute: 0 10*3/uL (ref 0.0–0.1)
Basophils Relative: 0 %
Eosinophils Absolute: 0.1 10*3/uL (ref 0.0–0.7)
Eosinophils Relative: 2 %
LYMPHS ABS: 1 10*3/uL (ref 0.7–4.0)
LYMPHS PCT: 16 %
MONO ABS: 0.5 10*3/uL (ref 0.1–1.0)
Monocytes Relative: 8 %
NEUTROS ABS: 5 10*3/uL (ref 1.7–7.7)
Neutrophils Relative %: 74 %

## 2016-06-01 LAB — I-STAT TROPONIN, ED: TROPONIN I, POC: 0 ng/mL (ref 0.00–0.08)

## 2016-06-01 LAB — LIPID PANEL
CHOL/HDL RATIO: 4.1 ratio
CHOLESTEROL: 219 mg/dL — AB (ref 0–200)
HDL: 54 mg/dL (ref >40)
LDL Cholesterol: 151 mg/dL — ABNORMAL HIGH (ref 0–99)
Triglycerides: 72 mg/dL (ref <150)
VLDL: 14 mg/dL (ref 0–40)

## 2016-06-01 LAB — PROTIME-INR
INR: 1.11
Prothrombin Time: 14.4 seconds (ref 11.4–15.2)

## 2016-06-01 LAB — ETHANOL

## 2016-06-01 LAB — CBG MONITORING, ED: GLUCOSE-CAPILLARY: 119 mg/dL — AB (ref 65–99)

## 2016-06-01 MED ORDER — LISINOPRIL 5 MG PO TABS
5.0000 mg | ORAL_TABLET | Freq: Every day | ORAL | Status: DC
  Administered 2016-06-01 – 2016-06-02 (×2): 5 mg via ORAL
  Filled 2016-06-01 (×2): qty 1

## 2016-06-01 MED ORDER — IOPAMIDOL (ISOVUE-370) INJECTION 76%
INTRAVENOUS | Status: AC
  Filled 2016-06-01: qty 50

## 2016-06-01 MED ORDER — SENNOSIDES-DOCUSATE SODIUM 8.6-50 MG PO TABS
1.0000 | ORAL_TABLET | Freq: Every evening | ORAL | Status: DC | PRN
  Administered 2016-06-02: 1 via ORAL
  Filled 2016-06-01 (×2): qty 1

## 2016-06-01 MED ORDER — ASPIRIN 81 MG PO CHEW
81.0000 mg | CHEWABLE_TABLET | Freq: Every day | ORAL | Status: DC
  Administered 2016-06-01: 81 mg via ORAL
  Filled 2016-06-01: qty 1

## 2016-06-01 MED ORDER — SODIUM CHLORIDE 0.9 % IV SOLN
Freq: Once | INTRAVENOUS | Status: AC
  Administered 2016-06-01: 1000 mL via INTRAVENOUS

## 2016-06-01 MED ORDER — LORATADINE 10 MG PO TABS
10.0000 mg | ORAL_TABLET | Freq: Every day | ORAL | Status: DC
  Administered 2016-06-01 – 2016-06-03 (×3): 10 mg via ORAL
  Filled 2016-06-01 (×3): qty 1

## 2016-06-01 MED ORDER — STROKE: EARLY STAGES OF RECOVERY BOOK
Freq: Once | Status: AC
  Administered 2016-06-01: 1
  Filled 2016-06-01: qty 1

## 2016-06-01 MED ORDER — POTASSIUM CHLORIDE CRYS ER 20 MEQ PO TBCR
40.0000 meq | EXTENDED_RELEASE_TABLET | Freq: Two times a day (BID) | ORAL | Status: AC
  Administered 2016-06-01 (×2): 40 meq via ORAL
  Filled 2016-06-01 (×2): qty 2

## 2016-06-01 MED ORDER — ATORVASTATIN CALCIUM 80 MG PO TABS
80.0000 mg | ORAL_TABLET | Freq: Every day | ORAL | Status: DC
  Administered 2016-06-01 – 2016-06-02 (×2): 80 mg via ORAL
  Filled 2016-06-01 (×2): qty 1

## 2016-06-01 MED ORDER — ASPIRIN EC 325 MG PO TBEC
325.0000 mg | DELAYED_RELEASE_TABLET | Freq: Every day | ORAL | Status: DC
  Administered 2016-06-02: 325 mg via ORAL
  Filled 2016-06-01: qty 1

## 2016-06-01 MED ORDER — ENOXAPARIN SODIUM 40 MG/0.4ML ~~LOC~~ SOLN
40.0000 mg | SUBCUTANEOUS | Status: DC
  Administered 2016-06-01 – 2016-06-03 (×3): 40 mg via SUBCUTANEOUS
  Filled 2016-06-01 (×3): qty 0.4

## 2016-06-01 MED ORDER — IOPAMIDOL (ISOVUE-370) INJECTION 76%
INTRAVENOUS | Status: AC
  Administered 2016-06-01: 50 mL
  Filled 2016-06-01: qty 50

## 2016-06-01 MED ORDER — POTASSIUM CHLORIDE CRYS ER 20 MEQ PO TBCR
40.0000 meq | EXTENDED_RELEASE_TABLET | ORAL | Status: AC
  Administered 2016-06-01: 40 meq via ORAL
  Filled 2016-06-01: qty 2

## 2016-06-01 MED ORDER — NITROGLYCERIN 0.4 MG SL SUBL: 0.4000 mg | SUBLINGUAL_TABLET | SUBLINGUAL | Status: DC | PRN

## 2016-06-01 MED ORDER — POTASSIUM CHLORIDE CRYS ER 10 MEQ PO TBCR
10.0000 meq | EXTENDED_RELEASE_TABLET | Freq: Every day | ORAL | Status: DC
  Administered 2016-06-01 – 2016-06-03 (×3): 10 meq via ORAL
  Filled 2016-06-01 (×3): qty 1

## 2016-06-01 MED ORDER — DEXTROSE 5 % IV SOLN
1.0000 g | INTRAVENOUS | Status: DC
  Administered 2016-06-01 – 2016-06-03 (×3): 1 g via INTRAVENOUS
  Filled 2016-06-01 (×4): qty 10

## 2016-06-01 MED ORDER — FUROSEMIDE 40 MG PO TABS
40.0000 mg | ORAL_TABLET | Freq: Every day | ORAL | Status: DC
  Administered 2016-06-01 – 2016-06-02 (×2): 40 mg via ORAL
  Filled 2016-06-01 (×2): qty 1

## 2016-06-01 MED ORDER — CLOPIDOGREL BISULFATE 75 MG PO TABS
75.0000 mg | ORAL_TABLET | Freq: Every day | ORAL | Status: DC
  Administered 2016-06-01 – 2016-06-03 (×3): 75 mg via ORAL
  Filled 2016-06-01 (×3): qty 1

## 2016-06-01 NOTE — H&P (Addendum)
History and Physical    [name] KGM:[id number] DOB: March 17, 1934 DOA: 06/01/2016  Referring MD/NP/PA: [name], MD PCP: Lucilla Edin, MD  Patient [facility]: [facility]  Chief Complaint: Slurred speech and facial droop  HPI: [name] is a 82 y.o. female with medical history significant of HTN, CAD, CVA; who presents after being slumped over on the toilet with slurred speech and facial droop. History is obtained from the patient's daughter states that she come to pick her up to take her home for Thanksgiving.  The patient was found on the toilet seat at 11:15 PM. daughter states that the patient was lethargic and had some blurred speech in the left lower part of her mouth was drooping. She was not responding and therefore EMS was called. Symptoms lasted approximately 20 minutes and by the time EMS arrived symptoms had resolved. Daughter decided to bring her to the emergency department for further evaluation. Associated symptoms include lower leg swelling and  lower abdominal pain. Daughter notes the patient has possibly missed taking 2 weeks of her potassium pills.  ED Course: admission into the emergency department patient was seen to be afebrile, heart rate 58-108, respirations 11-20, and all other vitals maintained. Lab work revealed CBC wnl, potassium 3.2, creatinine 1.08, and all other labs within normal limits. CT scan of the brain revealed no acute abnormalities. UA was positive for many bacteria. Neurology was consulted and recommended admission for further workup.  Review of Systems: As per HPI otherwise 10 point review of systems negative.   Past Medical History:  Diagnosis Date  . Abnormal CT of the abdomen    Nodes  resolved  . Arthritis   . Coronary artery disease    a. [place] s/p BMS to prox LAD (with residual moderate mid-LAD & mid-RCA dz for medical therapy) 01/2012 - EF 40% at that time by cath.  . CVA (cerebral vascular accident)  (HCC)    Right brain CVA 04/2011 - carotid dopplers showed no significant stenosis (tortuous ICA).   . Diverticulosis   . Hypertension   . Inguinal hernia    Right, seen on CT 2008  . LV dysfunction    EF 40% by cath 01/2012 with WMA, but 50% by f/u echo same admission  . Osteoporosis   . Pyelonephritis 2006  . Subdural hematoma (HCC)    2004 s/p craniotomy    Past Surgical History:  Procedure Laterality Date  . CARDIAC CATHETERIZATION  02/07/2012  . CESAREAN SECTION     X 2  . CORONARY STENT PLACEMENT  02/07/2012   LAD  . CRANIOTOMY  2004  . FLEXIBLE SIGMOIDOSCOPY  11/24/2004   Dr. Mann--Diverticulosis   . LEFT HEART CATHETERIZATION WITH CORONARY ANGIOGRAM N/A 02/07/2012   Procedure: LEFT HEART CATHETERIZATION WITH CORONARY ANGIOGRAM;  Surgeon: Peter M Swaziland, MD;  Location: [facility] CATH LAB;  Service: Cardiovascular;  Laterality: N/A;  . PERCUTANEOUS CORONARY STENT INTERVENTION (PCI-S)  02/07/2012   Procedure: PERCUTANEOUS CORONARY STENT INTERVENTION (PCI-S);  Surgeon: Peter M Swaziland, MD;  Location: [facility] CATH LAB;  Service: Cardiovascular;;  . TONSILLECTOMY AND ADENOIDECTOMY    . TOTAL ABDOMINAL HYSTERECTOMY  1985   FIBROID     reports that she quit smoking about 13 years ago. Her smoking use included Cigarettes. She has never used smokeless tobacco. She reports that she drinks alcohol. She reports that she does not use drugs.  Allergies  Allergen Reactions  . Latex Itching  . Flagyl [Metronidazole]  Rash    Family History  Problem Relation Age of Onset  . Stroke Father     Prior to Admission medications   Medication Sig Start Date End Date Taking? Authorizing Provider  aspirin 81 MG tablet Take 81 mg by mouth daily.   Yes Historical Provider, MD  cetirizine (ZYRTEC) 10 MG tablet Take 10 mg by mouth daily as needed. Reported on 11/07/2015   Yes Historical Provider, MD  furosemide (LASIX) 20 MG tablet Take 40 mg by mouth daily.   Yes Historical Provider, MD  lisinopril  (PRINIVIL,ZESTRIL) 5 MG tablet take 1 tablet by mouth once daily 03/30/16  Yes Collene GobbleSteven A Daub, MD  nitroGLYCERIN (NITROSTAT) 0.4 MG SL tablet place 1 tablet under the tongue every 5 minutes if needed for chest pain 10/26/15  Yes Peter M SwazilandJordan, MD  potassium chloride (MICRO-K) 10 MEQ CR capsule take 1 capsule by mouth once daily 05/13/16  Yes Ethelda ChickKristi M Smith, MD  furosemide (LASIX) 20 MG tablet take 1 tablet by mouth once daily Patient not taking: Reported on 06/01/2016 03/30/16   Collene GobbleSteven A Daub, MD    Physical Exam:   Constitutional:Elderly female in NAD, calm, comfortable Vitals:   06/01/16 0130 06/01/16 0145 06/01/16 0200 06/01/16 0215  BP: 120/74 129/83 116/70 108/73  Pulse: (!) 59   (!) 58  Resp: 11 20 15 15   Temp:      TempSrc:      SpO2: 99%   100%   Eyes: PERRL, lids and conjunctivae normal ENMT: Mucous membranes are moist. Posterior pharynx clear of any exudate or lesions.  Neck: normal, supple, no masses, no thyromegaly Respiratory: clear to auscultation bilaterally, no wheezing, no crackles. Normal respiratory effort. No accessory muscle use.  Cardiovascular: Regular rate and rhythm, no murmurs / rubs / gallops. +1 lower extremity edema to the mid tibia. 2+ pedal pulses. No carotid bruits.  Abdomen: tenderness to palpation of the suprapubic region, no masses palpated. No hepatosplenomegaly. Bowel sounds positive.  Musculoskeletal: no clubbing / cyanosis. No joint deformity upper and lower extremities. Good ROM, no contractures. Normal muscle tone.  Skin: no rashes, lesions, ulcers. No induration Neurologic: CN 2-12 grossly intact. Sensation intact, DTR normal. Strength 5/5 in all 4.  Psychiatric: Normal judgment and insight. Alert and oriented x 3. Normal mood.     Labs on Admission: I have personally reviewed following labs and imaging studies  CBC:  Recent Labs Lab 06/01/16 0058 06/01/16 0111  WBC 6.7  --   NEUTROABS 5.0  --   HGB 15.0 15.3*  HCT 44.5 45.0  MCV 93.7   --   PLT 152  --    Basic Metabolic Panel:  Recent Labs Lab 06/01/16 0111  NA 144  K 3.3*  CL 102  GLUCOSE 103*  BUN 11  CREATININE 1.20*   GFR: CrCl cannot be calculated (Unknown ideal weight.). Liver Function Tests: No results for input(s): AST, ALT, ALKPHOS, BILITOT, PROT, ALBUMIN in the last 168 hours. No results for input(s): LIPASE, AMYLASE in the last 168 hours. No results for input(s): AMMONIA in the last 168 hours. Coagulation Profile:  Recent Labs Lab 06/01/16 0058  INR 1.11   Cardiac Enzymes: No results for input(s): CKTOTAL, CKMB, CKMBINDEX, TROPONINI in the last 168 hours. BNP (last 3 results) No results for input(s): PROBNP in the last 8760 hours. HbA1C: No results for input(s): HGBA1C in the last 72 hours. CBG:  Recent Labs Lab 06/01/16 0124  GLUCAP 119*   Lipid Profile: No  results for input(s): CHOL, HDL, LDLCALC, TRIG, CHOLHDL, LDLDIRECT in the last 72 hours. Thyroid Function Tests: No results for input(s): TSH, T4TOTAL, FREET4, T3FREE, THYROIDAB in the last 72 hours. Anemia Panel: No results for input(s): VITAMINB12, FOLATE, FERRITIN, TIBC, IRON, RETICCTPCT in the last 72 hours. Urine analysis:    Component Value Date/Time   COLORURINE YELLOW 04/12/2011 2157   APPEARANCEUR CLEAR 04/12/2011 2157   LABSPEC 1.009 04/12/2011 2157   PHURINE 5.5 04/12/2011 2157   GLUCOSEU NEGATIVE 04/12/2011 2157   HGBUR NEGATIVE 04/12/2011 2157   BILIRUBINUR neg 07/19/2013 1600   KETONESUR NEGATIVE 04/12/2011 2157   PROTEINUR neg 07/19/2013 1600   PROTEINUR NEGATIVE 04/12/2011 2157   UROBILINOGEN 0.2 07/19/2013 1600   UROBILINOGEN 0.2 04/12/2011 2157   NITRITE neg 07/19/2013 1600   NITRITE NEGATIVE 04/12/2011 2157   LEUKOCYTESUR large (3+) 07/19/2013 1600   Sepsis Labs: No results found for this or any previous visit (from the past 240 hour(s)).   Radiological Exams on Admission: Ct Head Code Stroke W/o Cm  Result Date: 06/01/2016 CLINICAL DATA:   Code stroke. Left-sided facial droop and left-sided weakness with history of old with cerebral vascular accident. Slurred speech resolved. EXAM: CT HEAD WITHOUT CONTRAST TECHNIQUE: Contiguous axial images were obtained from the base of the skull through the vertex without intravenous contrast. COMPARISON:  04/12/2011 CT head. FINDINGS: Brain: No evidence of acute infarction, hemorrhage, hydrocephalus, extra-axial collection or mass lesion/mass effect. Lucency in right posterior limb of internal capsule corresponding to prior infarct. Additional small lucencies in the basal ganglia bilaterally may represent prominent perivascular spaces or chronic lacunar infarcts. Mild chronic microvascular ischemic changes of the brain parenchyma. Mild brain parenchymal volume loss progressed from 2012. Vascular: No hyperdense vessel identified. Moderate calcific atherosclerosis of carotid siphons. Skull: Postsurgical changes related to right frontal temporal craniotomy. Sinuses/Orbits: Left maxillary sinus small mucous retention cyst. Otherwise visualized paranasal sinuses and mastoid air cells are normally aerated. Opacification of external auditory canals is likely cerumen. Other: None. ASPECTS [facility] Stroke Program Early CT Score) - Ganglionic level infarction (caudate, lentiform nuclei, internal capsule, insula, M1-M3 cortex): 7 - Supraganglionic infarction (M4-M6 cortex): 3 Total score (0-10 with 10 being normal): 10 IMPRESSION: 1. No acute intracranial abnormality identified. 2. Mild chronic microvascular ischemic changes and parenchymal volume loss is progressed from 2012. 3. Small lucencies in the basal ganglia bilaterally probably represent chronic lacunar infarcts. 4. ASPECTS is 10 These results were called by telephone at the time of interpretation on 06/01/2016 at 1:24 am to Dr. Amada Jupiter, who verbally acknowledged these results. Electronically Signed   By: Mitzi Hansen M.D.   On: 06/01/2016 01:26     EKG: Independently reviewed. Sinus rhythm   Assessment/Plan TIA (transient ischemic attack): Acute. Patient with brief episode of slurred speech and left-sided facial droop that self resolved prior to arrival. CT scan of the brain negative for any acute abnormalities. - Admit to a telemetry bed  - TIA/stroke order set initiated - Check MRI and echocardiogram - Continue aspirin - Appreciate neurology consultative services, will follow up for further recommendations  Urinary tract infection: Acute. Patient presents with complaints of lower abdominal pain. UA positive for many bacteria, positive nitrites  - Check urine culture - Rocephin IV  Hypokalemia: Potassium 3.3 on admission - Given 40 mEq 1 dose - Continue home potassium chloride dose of 10 mEq per day  Essential hypertension - Continue lisinopril and furosemide  History of CVA   Lower extremity edema: Chronic. Patient with 1+ pitting  edema of bilateral lower extremities noted. - Continue furosemide - Advised patient to consider wearing compression stockings   DVT prophylaxis: Lovenox Code Status: Full Family Communication: Discussed overall patient care to patient and her daughter.  Disposition Plan:  likely discharge if workup negative  Consults called: Neurology Admission status: Telemetry observation   Jill Braunondell A Smith MD [facility] Pager [phone]  If 7PM-7AM, please contact night-coverage [email or web address] Password [email or web address]  06/01/2016, 2:48 AM

## 2016-06-01 NOTE — Progress Notes (Signed)
EEG Completed; Results Pending  

## 2016-06-01 NOTE — Progress Notes (Signed)
Markus DaftMargaret Gallon is a 82 yo female comes to ED via private vehicle. Pt found by daughter with a change in mental status while on the toilet having a BM approx 2200. Symptoms associated with Left side facial droop and speech difficulties. LKW 2215, NIH 1, CBG 100. Pt admitted for TIA work up

## 2016-06-01 NOTE — ED Notes (Signed)
Pt brought in by daughter as code stroke. Pt daughter stated that patient was in the bathroom. Per daughter pt was difficult to arouse and not mumbling. Daughter also reported that pt had left sided facial droop. When pt arrived to ED all symptoms have resolved. NIH scale is 1. Pt is disoriented to time. Pt is ambulatory with no other complaints. Plan is for overnight observation.

## 2016-06-01 NOTE — ED Triage Notes (Signed)
Per daughter pt was discovered at 11:15pm sitting on toilet slumped over with slurred speech and facial droop; EMS arrived  Pt symptoms resolved after about 20 minutes; Pt arrives a&ox 4; Dr.Rancour notified and assessed patient in triage; Charge notified to call code stroke; Pt transported to CT from triage; pt has hx of stroke and stents placed per family

## 2016-06-01 NOTE — Progress Notes (Signed)
Pt arrived around 0400 alert and oriented X3 having trouble with time, originally having sym[ptoms of slurred speech and facial droop and some AMS all have subsided. Patient walked to bed with no assistance NIH is 0 at this time UA was positive for UTI will administer antibiotics, started her Q2 neuro and vital checks.

## 2016-06-01 NOTE — Progress Notes (Signed)
[facility] PROGRESS NOTE    Progress Note  ARYSSA ROSAMOND  ZOX:[id number] DOB: June 15, 1934 DOA: 06/01/2016 PCP: Lucilla Edin, MD     Brief Narrative:   [name] is an 82 y.o. female past medical history of hypertension and CVA who presents after being slumped over on the toilet with slurred speech and facial droop  Assessment/Plan:   TIA (transient ischemic attack) HgbA1c, fasting lipid panel  LDL > 100, HDL 54 CT head showed no acute bleeds. MRI, MRA of the brain without contrast Shows slow flow to the right middle cerebral artery, but no acute infarct or hemorrhage, there is small vessel disease, small progression of multiple lacunar infarcts at the basal ganglia. PT, OT, Speech consult pending Echocardiogram pendig Carotid dopplers pending Prophylactic therapy-Antiplatelet med: Aspirin - dose 81 mg PO daily  Cardiac Monitoring no events on telemetry  Essential hypertension: Allow permissive hypertension.  Hypokalemia Replete orally recheck a magnesium level.  Infection of urinary tract Agree with IV Rocephin awaiting urine cultures.  Lower extremity edema: Cont lasix.  DVT prophylaxis: lovenox Family Communication:none Disposition Plan/Barrier to D/C: home in am Code Status:     Code Status Orders        Start     Ordered   06/01/16 0346  Full code  Continuous     06/01/16 0349    Code Status History    Date Active Date Inactive Code Status Order ID Comments User Context   This patient has a current code status but no historical code status.        IV Access:    Peripheral IV   Procedures and diagnostic studies:   Mr Brain Wo Contrast  Result Date: 06/01/2016 CLINICAL DATA:  Found slumped over with slurred speech and facial droop. Patient found in the skin addition on toilet seat at 11:15 p.m. last evening. EXAM: MRI HEAD WITHOUT CONTRAST MRA HEAD WITHOUT CONTRAST TECHNIQUE: Multiplanar, multiecho pulse sequences of the brain  and surrounding structures were obtained without intravenous contrast. Angiographic images of the head were obtained using MRA technique without contrast. COMPARISON:  CT head without contrast from the same day. MRI brain 04/13/2011. FINDINGS: MRI HEAD FINDINGS Brain: Right craniotomy is again noted. The diffusion-weighted images demonstrate no evidence for acute or subacute infarcts. Remote lacunar infarcts are present within basal ganglia and posterior limb internal capsule bilaterally. These have progressed since the prior exam. No acute infarcts are present. Mild periventricular white matter changes have progressed slightly as well. There is minimal encephalomalacia within the right frontal operculum. The ventricles are proportionate to the degree of atrophy. Insert pass fluid Vascular: Abnormal signal is present in the right M1 segment. Flow is otherwise present within the major intracranial arteries. Skull and upper cervical spine: Internal carotid arteries demonstrate mild atherosclerotic changes within the cavernous segments bilaterally. Sinuses/Orbits: A chronic polyp or mucous retention cysts is stable in the medial posterior right left maxillary sinus. The paranasal sinuses and mastoid air cells are otherwise clear. The globes and orbits are intact. MRA HEAD FINDINGS There is minimal atherosclerotic irregularity within the left M1 and A1 segment. Mild to moderate distal left M1 segment narrowing is present. There is marked signal loss within the right M1 segment suggesting slow or occluded flow. The right A1 segment is normal. There is some attenuation of distal ACA branch vessels bilaterally. Moderate MCA attenuation is present on the left distal to the bifurcation. Mild narrowing is present in the distal vertebral arteries bilaterally. The  left vertebral artery is the dominant vessel. There is mild narrowing of the mid basilar artery of less than 50%. The distal basilar artery is normal. The left  posterior cerebral artery originates from the basilar tip. The right posterior cerebral artery is of fetal type. There is moderate attenuation PCA branch vessels bilaterally. IMPRESSION: 1. Slow or occluded flow within the right middle cerebral artery from the level of the ICA terminus without evidence for acute infarct or hemorrhage. 2. Moderate diffuse medium and small vessel disease compatible with extensive intracranial atherosclerotic disease. 3. Slight progression of multiple remote lacunar infarcts within the basal ganglia. 4. Slight progression of diffuse white matter disease compatible with the extensive medium and small vessel disease. 5. Right frontal and parietal craniotomy. Electronically Signed   By: Marin Robertshristopher  Mattern M.D.   On: 06/01/2016 09:32   Mr Maxine GlennMra Head/brain ZOWo Cm  Result Date: 06/01/2016 CLINICAL DATA:  Found slumped over with slurred speech and facial droop. Patient found in the skin addition on toilet seat at 11:15 p.m. last evening. EXAM: MRI HEAD WITHOUT CONTRAST MRA HEAD WITHOUT CONTRAST TECHNIQUE: Multiplanar, multiecho pulse sequences of the brain and surrounding structures were obtained without intravenous contrast. Angiographic images of the head were obtained using MRA technique without contrast. COMPARISON:  CT head without contrast from the same day. MRI brain 04/13/2011. FINDINGS: MRI HEAD FINDINGS Brain: Right craniotomy is again noted. The diffusion-weighted images demonstrate no evidence for acute or subacute infarcts. Remote lacunar infarcts are present within basal ganglia and posterior limb internal capsule bilaterally. These have progressed since the prior exam. No acute infarcts are present. Mild periventricular white matter changes have progressed slightly as well. There is minimal encephalomalacia within the right frontal operculum. The ventricles are proportionate to the degree of atrophy. Insert pass fluid Vascular: Abnormal signal is present in the right M1  segment. Flow is otherwise present within the major intracranial arteries. Skull and upper cervical spine: Internal carotid arteries demonstrate mild atherosclerotic changes within the cavernous segments bilaterally. Sinuses/Orbits: A chronic polyp or mucous retention cysts is stable in the medial posterior right left maxillary sinus. The paranasal sinuses and mastoid air cells are otherwise clear. The globes and orbits are intact. MRA HEAD FINDINGS There is minimal atherosclerotic irregularity within the left M1 and A1 segment. Mild to moderate distal left M1 segment narrowing is present. There is marked signal loss within the right M1 segment suggesting slow or occluded flow. The right A1 segment is normal. There is some attenuation of distal ACA branch vessels bilaterally. Moderate MCA attenuation is present on the left distal to the bifurcation. Mild narrowing is present in the distal vertebral arteries bilaterally. The left vertebral artery is the dominant vessel. There is mild narrowing of the mid basilar artery of less than 50%. The distal basilar artery is normal. The left posterior cerebral artery originates from the basilar tip. The right posterior cerebral artery is of fetal type. There is moderate attenuation PCA branch vessels bilaterally. IMPRESSION: 1. Slow or occluded flow within the right middle cerebral artery from the level of the ICA terminus without evidence for acute infarct or hemorrhage. 2. Moderate diffuse medium and small vessel disease compatible with extensive intracranial atherosclerotic disease. 3. Slight progression of multiple remote lacunar infarcts within the basal ganglia. 4. Slight progression of diffuse white matter disease compatible with the extensive medium and small vessel disease. 5. Right frontal and parietal craniotomy. Electronically Signed   By: Marin Robertshristopher  Mattern M.D.   On: 06/01/2016 09:32  Ct Head Code Stroke W/o Cm  Result Date: 06/01/2016 CLINICAL DATA:   Code stroke. Left-sided facial droop and left-sided weakness with history of old with cerebral vascular accident. Slurred speech resolved. EXAM: CT HEAD WITHOUT CONTRAST TECHNIQUE: Contiguous axial images were obtained from the base of the skull through the vertex without intravenous contrast. COMPARISON:  04/12/2011 CT head. FINDINGS: Brain: No evidence of acute infarction, hemorrhage, hydrocephalus, extra-axial collection or mass lesion/mass effect. Lucency in right posterior limb of internal capsule corresponding to prior infarct. Additional small lucencies in the basal ganglia bilaterally may represent prominent perivascular spaces or chronic lacunar infarcts. Mild chronic microvascular ischemic changes of the brain parenchyma. Mild brain parenchymal volume loss progressed from 2012. Vascular: No hyperdense vessel identified. Moderate calcific atherosclerosis of carotid siphons. Skull: Postsurgical changes related to right frontal temporal craniotomy. Sinuses/Orbits: Left maxillary sinus small mucous retention cyst. Otherwise visualized paranasal sinuses and mastoid air cells are normally aerated. Opacification of external auditory canals is likely cerumen. Other: None. ASPECTS [facility]([place] Stroke Program Early CT Score) - Ganglionic level infarction (caudate, lentiform nuclei, internal capsule, insula, M1-M3 cortex): 7 - Supraganglionic infarction (M4-M6 cortex): 3 Total score (0-10 with 10 being normal): 10 IMPRESSION: 1. No acute intracranial abnormality identified. 2. Mild chronic microvascular ischemic changes and parenchymal volume loss is progressed from 2012. 3. Small lucencies in the basal ganglia bilaterally probably represent chronic lacunar infarcts. 4. ASPECTS is 10 These results were called by telephone at the time of interpretation on 06/01/2016 at 1:24 am to Dr. Amada JupiterKirkpatrick, who verbally acknowledged these results. Electronically Signed   By: Mitzi HansenLance  Furusawa-Stratton M.D.   On: 06/01/2016 01:26      Medical Consultants:    None.  Anti-Infectives:   rocephin  Subjective:    Jill FischerMargaret A Dalal no complains  Objective:    Vitals:   06/01/16 0415 06/01/16 0600 06/01/16 0728 06/01/16 1012  BP: 131/77 (!) 147/90 (!) 147/112 (!) 142/87  Pulse: (!) 56 62 71 63  Resp: 16  14 16   Temp: 98.1 F (36.7 C) 97.9 F (36.6 C)    TempSrc: Oral Oral    SpO2: 100% 100% 95%   Weight:      Height:        Intake/Output Summary (Last 24 hours) at 06/01/16 1116 Last data filed at 06/01/16 0515  Gross per 24 hour  Intake            83.75 ml  Output             1000 ml  Net          -916.25 ml   Filed Weights   06/01/16 0400  Weight: 116.4 kg (256 lb 9.9 oz)    Exam: General exam: In no acute distress. Respiratory system: Good air movement and clear to auscultation. Cardiovascular system: S1 & S2 heard, RRR. No JVD, murmurs, rubs, gallops or clicks.  Gastrointestinal system: Abdomen is nondistended, soft and nontender.  Extremities: No pedal edema. Skin: No rashes, lesions or ulcers Psychiatry: Judgement and insight appear normal. Mood & affect appropriate.    Data Reviewed:    Labs: Basic Metabolic Panel:  Recent Labs Lab 06/01/16 0058 06/01/16 0111  NA 142 144  K 3.2* 3.3*  CL 105 102  CO2 29  --   GLUCOSE 100* 103*  BUN 11 11  CREATININE 1.08* 1.20*  CALCIUM 10.0  --    GFR Estimated Creatinine Clearance: 43.6 mL/min (by C-G formula based on SCr of 1.2 mg/dL (H)).  Liver Function Tests:  Recent Labs Lab 06/01/16 0058  AST 20  ALT 11*  ALKPHOS 70  BILITOT 0.8  PROT 7.3  ALBUMIN 3.9   No results for input(s): LIPASE, AMYLASE in the last 168 hours. No results for input(s): AMMONIA in the last 168 hours. Coagulation profile  Recent Labs Lab 06/01/16 0058  INR 1.11    CBC:  Recent Labs Lab 06/01/16 0058 06/01/16 0111  WBC 6.7  --   NEUTROABS 5.0  --   HGB 15.0 15.3*  HCT 44.5 45.0  MCV 93.7  --   PLT 152  --    Cardiac  Enzymes: No results for input(s): CKTOTAL, CKMB, CKMBINDEX, TROPONINI in the last 168 hours. BNP (last 3 results) No results for input(s): PROBNP in the last 8760 hours. CBG:  Recent Labs Lab 06/01/16 0124  GLUCAP 119*   D-Dimer: No results for input(s): DDIMER in the last 72 hours. Hgb A1c: No results for input(s): HGBA1C in the last 72 hours. Lipid Profile:  Recent Labs  06/01/16 0111  CHOL 219*  HDL 54  LDLCALC 151*  TRIG 72  CHOLHDL 4.1   Thyroid function studies: No results for input(s): TSH, T4TOTAL, T3FREE, THYROIDAB in the last 72 hours.  Invalid input(s): FREET3 Anemia work up: No results for input(s): VITAMINB12, FOLATE, FERRITIN, TIBC, IRON, RETICCTPCT in the last 72 hours. Sepsis Labs:  Recent Labs Lab 06/01/16 0058  WBC 6.7   Microbiology No results found for this or any previous visit (from the past 240 hour(s)).   Medications:   . aspirin  81 mg Oral Daily  . cefTRIAXone (ROCEPHIN)  IV  1 g Intravenous Q24H  . enoxaparin (LOVENOX) injection  40 mg Subcutaneous Q24H  . furosemide  40 mg Oral Daily  . lisinopril  5 mg Oral Daily  . loratadine  10 mg Oral Daily  . potassium chloride  10 mEq Oral Daily   Continuous Infusions:  Time spent: 25 min   LOS: 0 days   Marinda Elk  [facility] Pager [phone]  *Please refer to [email or web address], password [email or web address] to get updated schedule on who will round on this patient, as hospitalists switch teams weekly. If 7PM-7AM, please contact night-coverage at [email or web address], password [email or web address] for any overnight needs.  06/01/2016, 11:16 AM

## 2016-06-01 NOTE — Care Management Obs Status (Signed)
MEDICARE OBSERVATION STATUS NOTIFICATION   Patient Details  Name: [name] MRN: [id number] Date of Birth: 11/22/1933   Medicare Observation Status Notification Given:  Yes    Kermit BaloKelli F Dominick Zertuche, RN 06/01/2016, 3:27 PM

## 2016-06-01 NOTE — Progress Notes (Signed)
STROKE TEAM PROGRESS NOTE   HISTORY OF PRESENT ILLNESS (per record) [name] is a 82 y.o. female with some memory problems at baseline who presents with decreased responsiveness and facial droop tonight. Her daughter found her sitting on the toilet after having not seen her for about an hour. She was mildly slurring her speech at first, and seemed very lethargic, but then progressed to a much more severe dysarthria as well as left facial droop. This resolved over the course of about 20 minutes. She seems to be back to her baseline at this point. She has a history of previous stroke without deficits. She was LKW 05/31/2016 at 10 PM. Premorbid modified rankin scale: 1. Patient was not administered IV t-PA secondary to rapidly improving symptoms. She was admitted for further evaluation and treatment.   SUBJECTIVE (INTERVAL HISTORY) No family at bedside. She is sitting in bed for lunch. She has no complains and at her baseline.    OBJECTIVE Temp:  [97.3 F (36.3 C)-98.1 F (36.7 C)] 97.9 F (36.6 C) (11/22 0600) Pulse Rate:  [56-108] 71 (11/22 0728) Cardiac Rhythm: Sinus bradycardia (11/22 0700) Resp:  [11-20] 14 (11/22 0728) BP: (108-147)/(70-112) 147/112 (11/22 0728) SpO2:  [95 %-100 %] 95 % (11/22 0728) Weight:  [116.4 kg (256 lb 9.9 oz)] 116.4 kg (256 lb 9.9 oz) (11/22 0400)  CBC:  Recent Labs Lab 06/01/16 0058 06/01/16 0111  WBC 6.7  --   NEUTROABS 5.0  --   HGB 15.0 15.3*  HCT 44.5 45.0  MCV 93.7  --   PLT 152  --     Basic Metabolic Panel:  Recent Labs Lab 06/01/16 0058 06/01/16 0111  NA 142 144  K 3.2* 3.3*  CL 105 102  CO2 29  --   GLUCOSE 100* 103*  BUN 11 11  CREATININE 1.08* 1.20*  CALCIUM 10.0  --     Lipid Panel:    Component Value Date/Time   CHOL 219 (H) 06/01/2016 0111   TRIG 72 06/01/2016 0111   HDL 54 06/01/2016 0111   CHOLHDL 4.1 06/01/2016 0111   VLDL 14 06/01/2016 0111   LDLCALC 151 (H) 06/01/2016 0111   HgbA1c:  Lab Results   Component Value Date   HGBA1C 5.8 (H) 04/13/2011   Urine Drug Screen:    Component Value Date/Time   LABOPIA NONE DETECTED 06/01/2016 0245   COCAINSCRNUR NONE DETECTED 06/01/2016 0245   LABBENZ NONE DETECTED 06/01/2016 0245   AMPHETMU NONE DETECTED 06/01/2016 0245   THCU NONE DETECTED 06/01/2016 0245   LABBARB NONE DETECTED 06/01/2016 0245      IMAGING I have personally reviewed the radiological images below and agree with the radiology interpretations.  Ct Head Code Stroke W/o Cm 06/01/2016 1. No acute intracranial abnormality identified. 2. Mild chronic microvascular ischemic changes and parenchymal volume loss is progressed from 2012. 3. Small lucencies in the basal ganglia bilaterally probably represent chronic lacunar infarcts. 4. ASPECTS is 10   Mri and Mra Brain Wo Contrast 06/01/2016 IMPRESSION: 1. Slow or occluded flow within the right middle cerebral artery from the level of the ICA terminus without evidence for acute infarct or hemorrhage. 2. Moderate diffuse medium and small vessel disease compatible with extensive intracranial atherosclerotic disease. 3. Slight progression of multiple remote lacunar infarcts within the basal ganglia. 4. Slight progression of diffuse white matter disease compatible with the extensive medium and small vessel disease. 5. Right frontal and parietal craniotomy.   EEG  This awake and asleep EEG  is abnormal due to occasional focal slowing over the right frontocentral region. Breach artifact is seen over the right central region. Clinical Correlation of the above findings indicates focal cerebral dysfunction over the right frontocentral region suggestive of underlying structural or physiologic abnormality. Breach artifact is consistent with prior surgery in this region. The absence of epileptiform discharges does not exclude a clinical diagnosis of epilepsy. Clinical correlation is advised.  CTA head and neck pending  TTE pending   PHYSICAL  EXAM  Temp:  [97.3 F (36.3 C)-98.1 F (36.7 C)] 97.9 F (36.6 C) (11/22 1545) Pulse Rate:  [56-108] 75 (11/22 1545) Resp:  [11-20] 18 (11/22 1545) BP: (108-147)/(67-112) 145/97 (11/22 1545) SpO2:  [95 %-100 %] 99 % (11/22 1545) Weight:  [256 lb 9.9 oz (116.4 kg)] 256 lb 9.9 oz (116.4 kg) (11/22 0400)  General - Well nourished, well developed, in no apparent distress.  Ophthalmologic - Fundi not visualized due to eye movement.  Cardiovascular - Regular rate and rhythm.  Mental Status -  Level of arousal and orientation to month, place, and person were intact, however not orientated to year. Language including expression, naming, repetition, comprehension was assessed and found intact.  Cranial Nerves II - XII - II - Visual field intact OU. III, IV, VI - Extraocular movements intact. V - Facial sensation intact bilaterally. VII - Facial movement intact bilaterally. VIII - Hearing & vestibular intact bilaterally. X - Palate elevates symmetrically. XI - Chin turning & shoulder shrug intact bilaterally. XII - Tongue protrusion intact.  Motor Strength - The patient's strength was normal in all extremities and pronator drift was absent.  Bulk was normal and fasciculations were absent.   Motor Tone - Muscle tone was assessed at the neck and appendages and was normal.  Reflexes - The patient's reflexes were 1+ in all extremities and she had no pathological reflexes.  Sensory - Light touch, temperature/pinprick were assessed and were symmetrical.    Coordination - The patient had normal movements in the hands with no ataxia or dysmetria.  Tremor was absent.  Gait and Station - deferred   ASSESSMENT/PLAN Ms. Jill FischerMargaret A Chapman is a 82 y.o. female with history of HTN, CAD, SDH presenting with slurred speech and facial droop. She did not receive IV t-PA due to rapidly improving symptoms.   TIA:  Right brain TIA due to right MCA occlusion, likely chronic  Resultant  No neuro  deficit  MRI  No acute infarct  MRA slow or occluded flow within the right middle cerebral artery from the level of the ICA terminus. Also left PCA stenosis  CTA head and neck pending  2D Echo  pending  Recommend 30 day cardiac event monitoring as out pt to rule out afib  LDL 151  HgbA1c pending  Lovenox 40 mg sq daily for VTE prophylaxis  Diet Heart Room service appropriate? Yes; Fluid consistency: Thin  aspirin 81 mg daily prior to admission, now on DAPT for intracranial stenosis. Recommend to continue DAPT on discharge for 3 months and then plavix alone.  Patient counseled to be compliant with her antithrombotic medications  Ongoing aggressive stroke risk factor management  Therapy recommendations:  pending   Disposition:  Anticipate return home  Right MCA occlusion  CTA head and neck pending to confirm occlusion  Most likely chronic   Will do 30 day cardiac event monitoring to rule out afib  Continue DAPT and high dose statin for maximized medical treatment  Hypertension  Stable  Permissive hypertension (  OK if < 220/120) but gradually normalize in 5-7 days  Long-term BP goal 130 -150 due to right MCA occlusion  Avoid hypotension  Orthostatic vitals indicating no orthostatic hypotension this time  Hyperlipidemia  Home meds:  No statin  LDL 151, goal < 70  Add lipitor 80mg   Continue statin at discharge  Other Stroke Risk Factors  Advanced age  Former Cigarette smoker  ETOH use, advised to drink no more than 1 drink(s) a day  Morbid Obesity, Body mass index is 50.12 kg/m., recommend weight loss, diet and exercise as appropriate   Hx stroke/TIA  04/2011 posterior R internal capsule infarct  02/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 After hours, contact General Neurology

## 2016-06-01 NOTE — Consult Note (Signed)
Neurology Consultation Reason for Consult: Left facial weakness, decreased responsiveness Referring Physician: David StallFeliz Ortiz, a  CC: Decrease Responsiveness  History is obtained from:Family, patient  HPI: Jill FischerMargaret A Kepple is a 82 y.o. female With some memory problems at base who presents with decreased responsiveness and facial tonight. Her daughter found her sitting on the toilet after having not seen her for about an hour. She was mildly slurring her speech at first, and seemed very lethargic, but then progressed to a much more severe dysarthria as well as left facial droop. This resolved over the course of about 20 minutes. She seems to be back to her baseline at this point. She has a history of previous stroke without deficits.   LKW: 10 PM tpa given?: no, Rapidly improving symptoms Premorbid modified rankin scale: 1    ROS: A 14 point ROS was performed and is negative except as noted in the HPI.   Past Medical History:  Diagnosis Date  . Abnormal CT of the abdomen    Nodes  resolved  . Arthritis   . Coronary artery disease    a. [place] s/p BMS to prox LAD (with residual moderate mid-LAD & mid-RCA dz for medical therapy) 01/2012 - EF 40% at that time by cath.  . CVA (cerebral vascular accident) (HCC)    Right brain CVA 04/2011 - carotid dopplers showed no significant stenosis (tortuous ICA).   . Diverticulosis   . Hypertension   . Inguinal hernia    Right, seen on CT 2008  . LV dysfunction    EF 40% by cath 01/2012 with WMA, but 50% by f/u echo same admission  . Osteoporosis   . Pyelonephritis 2006  . Subdural hematoma (HCC)    2004 s/p craniotomy     Family History  Problem Relation Age of Onset  . Stroke Father      Social History:  reports that she quit smoking about 13 years ago. Her smoking use included Cigarettes. She has never used smokeless tobacco. She reports that she drinks alcohol. She reports that she does not use drugs.   Exam: Current vital signs: BP (!)  147/90 (BP Location: Left Arm)   Pulse 62   Temp 97.9 F (36.6 C) (Oral)   Resp 16   Ht 5' (1.524 m)   Wt 116.4 kg (256 lb 9.9 oz)   SpO2 100%   BMI 50.12 kg/m  Vital signs in last 24 hours: Temp:  [97.3 F (36.3 C)-98.1 F (36.7 C)] 97.9 F (36.6 C) (11/22 0600) Pulse Rate:  [56-108] 62 (11/22 0600) Resp:  [11-20] 16 (11/22 0415) BP: (108-147)/(70-90) 147/90 (11/22 0600) SpO2:  [97 %-100 %] 100 % (11/22 0600) Weight:  [116.4 kg (256 lb 9.9 oz)] 116.4 kg (256 lb 9.9 oz) (11/22 0400)   Physical Exam  Constitutional: Appears well-developed and well-nourished.  Psych: Affect appropriate to situation Eyes: No scleral injection HENT: No OP obstrucion Head: Normocephalic.  Cardiovascular: Normal rate and regular rhythm.  Respiratory: Effort normal and breath sounds normal to anterior ascultation GI: Soft.  No distension. There is no tenderness.  Skin: WDI  Neuro: Mental Status: Patient is awake, alert, oriented to person, place, month, year, and situation. Patient is able to give a clear and coherent history. No signs of aphasia or neglect Cranial Nerves: II: Visual Fields are full. Pupils are equal, round, and reactive to light.   III,IV, VI: EOMI without ptosis or diploplia.  V: Facial sensation is symmetric to temperature VII: Facial movement  is symmetric.  VIII: hearing is intact to voice X: Uvula elevates symmetrically XI: Shoulder shrug is symmetric. XII: tongue is midline without atrophy or fasciculations.  Motor: Tone is normal. Bulk is normal. 5/5 strength was present in all four extremities. ? Very mild right-sided weakness Sensory: Sensation is symmetric to light touch and temperature in the arms and legs. Deep Tendon Reflexes: 2+ and symmetric in the biceps and patellae.  Plantars: Toes are downgoing bilaterally.  Cerebellar: FNF and HKS are intact bilaterally  I have reviewed labs in epic and the results pertinent to this consultation are: No  leukocytosis, normal creatinine  I have reviewed the images obtained:CT head-negative  Impression: 82 year old female with transient eye closure, left facial weakness, decreased responsiveness. She remembers the episode, though is was hazy on some Events leading up to it. I suspect that this represents TIA and I would treat it as such, but I would favor getting EEG as well.   Recommendations: 1. HgbA1c, fasting lipid panel 2. MRI, MRA  of the brain without contrast 3. Frequent neuro checks 4. Echocardiogram 5. Carotid dopplers 6. Prophylactic therapy-Antiplatelet med: Aspirin - dose 325mg  PO or 300mg  PR 7. Risk factor modification 8. Telemetry monitoring 9. PT consult, OT consult, Speech consult 10. please page stroke NP  Or  PA  Or MD  M-F from 8am -4 pm starting 11/22 as this patient will be followed by the stroke team at this point.   You can look them up on [email or web address]   11. EEG    Ritta SlotMcNeill Robbye Dede, MD [facility] [phone]  If 7pm- 7am, please page neurology on call as listed in AMION.

## 2016-06-01 NOTE — ED Notes (Signed)
Called [facility], Paged code stroke- TY

## 2016-06-01 NOTE — ED Provider Notes (Signed)
[facility]-EMERGENCY DEPT Provider Note   CSN: [id number] Arrival date & time: 06/01/16  0034 By signing my name below, I, [name], attest that this documentation has been prepared under the direction and in the presence of Jill Octave, MD. Electronically Signed: Linus Chapman, ED Scribe. 06/01/16. 12:54 AM.  History   Chief Complaint Chief Complaint  Patient presents with  . Code Stroke   The history is provided by the patient. No language interpreter was used.   HPI Comments: [name] is a 82 y.o. female who presents to the Emergency Department complaining of mental status changes that began at 10 PM, prior to arrival Daughter states she found the pt on the toilet as she was having a BM. The pt was not responding to the daughter who also noted left sided facial droop and speech difficulties. Daughter states this lasted for approximately 20 minutes who then returned to baseline before EMS arrived. Pt states she remembers daughter trying to talk to her. Currently in the ED, daughter reports the pt is asymptomatic. Pt denies any fevers, cough, congestion, chills, CP, SOB, N/V/D, or any other symptoms at this time. Last known normal 10 PM. Pt is not on blood thinners.   Past Medical History:  Diagnosis Date  . Abnormal CT of the abdomen    Nodes  resolved  . Arthritis   . Coronary artery disease    a. [place] s/p BMS to prox LAD (with residual moderate mid-LAD & mid-RCA dz for medical therapy) 01/2012 - EF 40% at that time by cath.  . CVA (cerebral vascular accident) (HCC)    Right brain CVA 04/2011 - carotid dopplers showed no significant stenosis (tortuous ICA).   . Diverticulosis   . Hypertension   . Inguinal hernia    Right, seen on CT 2008  . LV dysfunction    EF 40% by cath 01/2012 with WMA, but 50% by f/u echo same admission  . Osteoporosis   . Pyelonephritis 2006  . Subdural hematoma (HCC)    2004 s/p craniotomy   Patient Active Problem List   Diagnosis Date  Noted  . Edema 02/17/2016  . CAD S/P PCI 2013   . Hyperlipidemia 04/13/2012  . History of CVA (cerebrovascular accident) 02/08/2012  . Personal history of subdural hematoma 02/08/2012  . Essential hypertension   . Diverticulosis   . Osteoporosis     Past Surgical History:  Procedure Laterality Date  . CARDIAC CATHETERIZATION  02/07/2012  . CESAREAN SECTION     X 2  . CORONARY STENT PLACEMENT  02/07/2012   LAD  . CRANIOTOMY  2004  . FLEXIBLE SIGMOIDOSCOPY  11/24/2004   Dr. Mann--Diverticulosis   . LEFT HEART CATHETERIZATION WITH CORONARY ANGIOGRAM N/A 02/07/2012   Procedure: LEFT HEART CATHETERIZATION WITH CORONARY ANGIOGRAM;  Surgeon: Peter M Sw[place]iland, MD;  Location: [facility] CATH LAB;  Service: Cardiovascular;  Laterality: N/A;  . PERCUTANEOUS CORONARY STENT INTERVENTION (PCI-S)  02/07/2012   Procedure: PERCUTANEOUS CORONARY STENT INTERVENTION (PCI-S);  Surgeon: Peter M [place], MD;  Location: [facility] CATH LAB;  Service: Cardiovascular;;  . TONSILLECTOMY AND ADENOIDECTOMY    . TOTAL ABDOMINAL HYSTERECTOMY  1985   FIBROID    OB History    No data available     Home Medications    Prior to Admission medications   Medication Sig Start Date End Date Taking? Authorizing Provider  alum & mag hydroxide-simeth (MAALOX/MYLANTA) 200-200-20 MG/5ML suspension Take 15 mLs by mouth every 6 (six) hours as needed. Reported on  10/26/2015    Historical Provider, MD  aspirin 81 MG tablet Take 81 mg by mouth daily.    Historical Provider, MD  cetirizine (ZYRTEC) 10 MG tablet Take 10 mg by mouth daily as needed. Reported on 11/07/2015    Historical Provider, MD  furosemide (LASIX) 20 MG tablet take 1 tablet by mouth once daily 03/30/16   Collene Gobble, MD  lisinopril (PRINIVIL,ZESTRIL) 5 MG tablet take 1 tablet by mouth once daily 03/30/16   Collene Gobble, MD  loperamide (IMODIUM A-D) 2 MG tablet Take 2 mg by mouth 4 (four) times daily as needed. As needed for loose stools.    Historical Provider, MD    naphazoline-glycerin (CLEAR EYES) 0.012-0.2 % SOLN Place 1 drop into both eyes every 4 (four) hours as needed. For irritated eyes    Historical Provider, MD  nitroGLYCERIN (NITROSTAT) 0.4 MG SL tablet place 1 tablet under the tongue every 5 minutes if needed for chest pain 10/26/15   Peter M [place], MD  potassium chloride (MICRO-K) 10 MEQ CR capsule take 1 capsule by mouth once daily 05/13/16   Ethelda Chick, MD  psyllium (METAMUCIL) 58.6 % powder Take 1 packet by mouth daily as needed.     Historical Provider, MD   Family History Family History  Problem Relation Age of Onset  . Stroke Father    Social History Social History  Substance Use Topics  . Smoking status: Former Smoker    Types: Cigarettes    Quit date: 04/10/2003  . Smokeless tobacco: Never Used     Comment: quit 2004  . Alcohol use Yes     Comment: occasional   Allergies   Latex and Flagyl [metronidazole]  Review of Systems Review of Systems A complete 10 system review of systems was obtained and all systems are negative except as noted in the HPI and PMH.   Physical Exam Updated Vital Signs BP 108/89 (BP Location: Left Arm)   Pulse 108   Temp 97.3 F (36.3 C) (Oral)   Resp 18   SpO2 97%   Physical Exam  Constitutional: She is oriented to person, place, and time. She appears well-developed and well-nourished. No distress.  HENT:  Head: Normocephalic and atraumatic.  Mouth/Throat: Oropharynx is clear and moist. No oropharyngeal exudate.  Eyes: Conjunctivae and EOM are normal. Pupils are equal, round, and reactive to light.  Neck: Normal range of motion. Neck supple.  No meningismus.  Cardiovascular: Normal rate, regular rhythm, normal heart sounds and intact distal pulses.   No murmur heard. Pulmonary/Chest: Effort normal and breath sounds normal. No respiratory distress.  Abdominal: Soft. There is no tenderness. There is no rebound and no guarding.  Musculoskeletal: Normal range of motion. She exhibits no  edema or tenderness.  Neurological: She is alert and oriented to person, place, and time. No cranial nerve deficit. She exhibits normal muscle tone. Coordination normal.   5/5 strength throughout. CN 2-12 intact.Equal grip strength. CN 2-12 intact, no ataxia on finger to nose, no nystagmus, 5/5 strength throughout, no pronator drift, Romberg negative, normal gait.  Skin: Skin is warm.  Psychiatric: She has a normal mood and affect. Her behavior is normal.  Nursing note and vitals reviewed.  ED Treatments / Results  DIAGNOSTIC STUDIES: Oxygen Saturation is 97% on room air, normal by my interpretation.    COORDINATION OF CARE: 1:00 AM Discussed treatment plan with pt at bedside and pt agreed to plan.  Labs (all labs ordered are listed, but  only abnormal results are displayed) Labs Reviewed  APTT - Abnormal; Notable for the following:       Result Value   aPTT 38 (*)    All other components within normal limits  COMPREHENSIVE METABOLIC PANEL - Abnormal; Notable for the following:    Potassium 3.2 (*)    Glucose, Bld 100 (*)    Creatinine, Ser 1.08 (*)    ALT 11 (*)    GFR calc non Af Amer 47 (*)    GFR calc Af Amer 55 (*)    All other components within normal limits  URINALYSIS, ROUTINE W REFLEX MICROSCOPIC (NOT AT [facility]) - Abnormal; Notable for the following:    Color, Urine AMBER (*)    APPearance CLOUDY (*)    Nitrite POSITIVE (*)    Leukocytes, UA SMALL (*)    All other components within normal limits  URINE MICROSCOPIC-ADD ON - Abnormal; Notable for the following:    Squamous Epithelial / LPF 0-5 (*)    Bacteria, UA MANY (*)    Casts HYALINE CASTS (*)    Crystals CA OXALATE CRYSTALS (*)    All other components within normal limits  I-STAT CHEM 8, ED - Abnormal; Notable for the following:    Potassium 3.3 (*)    Creatinine, Ser 1.20 (*)    Glucose, Bld 103 (*)    Hemoglobin 15.3 (*)    All other components within normal limits  CBG MONITORING, ED - Abnormal; Notable for  the following:    Glucose-Capillary 119 (*)    All other components within normal limits  URINE CULTURE  ETHANOL  PROTIME-INR  CBC  DIFFERENTIAL  RAPID URINE DRUG SCREEN, HOSP PERFORMED  HEMOGLOBIN A1C  LIPID PANEL  I-STAT TROPOININ, ED   EKG  EKG Interpretation  Date/Time:  Wednesday June 01 2016 00:53:44 EST Ventricular Rate:  68 PR Interval:    QRS Duration: 98 QT Interval:  398 QTC Calculation: 423 R Axis:   -62 Text Interpretation:  probably sinus with PVCs Left axis deviation Possible Anteroseptal infarct , age undetermined Abnormal ECG Confirmed by Manus GunningANCOUR  MD, Athelene Hursey [phone]([id number]) on 06/01/2016 1:17:03 AM      Radiology Ct Head Code Stroke W/o Cm  Result Date: 06/01/2016 CLINICAL DATA:  Code stroke. Left-sided facial droop and left-sided weakness with history of old with cerebral vascular accident. Slurred speech resolved. EXAM: CT HEAD WITHOUT CONTRAST TECHNIQUE: Contiguous axial images were obtained from the base of the skull through the vertex without intravenous contrast. COMPARISON:  04/12/2011 CT head. FINDINGS: Brain: No evidence of acute infarction, hemorrhage, hydrocephalus, extra-axial collection or mass lesion/mass effect. Lucency in right posterior limb of internal capsule corresponding to prior infarct. Additional small lucencies in the basal ganglia bilaterally may represent prominent perivascular spaces or chronic lacunar infarcts. Mild chronic microvascular ischemic changes of the brain parenchyma. Mild brain parenchymal volume loss progressed from 2012. Vascular: No hyperdense vessel identified. Moderate calcific atherosclerosis of carotid siphons. Skull: Postsurgical changes related to right frontal temporal craniotomy. Sinuses/Orbits: Left maxillary sinus small mucous retention cyst. Otherwise visualized paranasal sinuses and mastoid air cells are normally aerated. Opacification of external auditory canals is likely cerumen. Other: None. ASPECTS [facility]([place]  Stroke Program Early CT Score) - Ganglionic level infarction (caudate, lentiform nuclei, internal capsule, insula, M1-M3 cortex): 7 - Supraganglionic infarction (M4-M6 cortex): 3 Total score (0-10 with 10 being normal): 10 IMPRESSION: 1. No acute intracranial abnormality identified. 2. Mild chronic microvascular ischemic changes and parenchymal volume loss is progressed from  2012. 3. Small lucencies in the basal ganglia bilaterally probably represent chronic lacunar infarcts. 4. ASPECTS is 10 These results were called by telephone at the time of interpretation on 06/01/2016 at 1:24 am to Dr. Amada JupiterKirkpatrick, who verbally acknowledged these results. Electronically Signed   By: Mitzi HansenLance  Furusawa-Stratton M.D.   On: 06/01/2016 01:26    Procedures Procedures (including critical care time)  Medications Ordered in ED Medications - No data to display  Initial Impression / Assessment and Plan / ED Course  I have reviewed the triage vital signs and the nursing notes.  Pertinent labs & imaging results that were available during my care of the patient were reviewed by me and considered in my medical decision making (see chart for details).  Clinical Course   Patient presents to triage as code stroke. Was found slumped over on the toilet with slurred speech and facial droop which is since improved. Daughter reports she had slurred speech and left facial droop for about 20 minutes. EMS was called but did not transport patient.  No deficits on original assessment. Code stroke canceled D/w  Dr. Amada JupiterKirkpatrick.  Labs reassuring. UA positive for infection. CT head shows no acute pathology but does show chronic infarcts.  Dr. Amada JupiterKirkpatrick recommends admission for TIA workup and EEG. Patient with no neurological deficits at this time and is back to baseline. Observation admission discussed with Dr. Katrinka BlazingSmith.   Final Clinical Impressions(s) / ED Diagnoses   Final diagnoses:  Transient cerebral ischemia, unspecified type   TIA (transient ischemic attack)  TIA (transient ischemic attack)   New Prescriptions New Prescriptions   No medications on file   I personally performed the services described in this documentation, which was scribed in my presence. The recorded information has been reviewed and is accurate.    Jill OctaveStephen Shabazz Mckey, MD 06/01/16 [phone]

## 2016-06-01 NOTE — Procedures (Signed)
ELECTROENCEPHALOGRAM REPORT  Date of Study: 06/01/2016  Patient's Name: [name] MRN: [id number] Date of Birth: 09-26-1933  Referring Provider: Dr. Ritta SlotMcNeill Kirkpatrick  Clinical History: This is an 82 year old woman with decreased responsiveness and facial droop.  Medications: aspirin chewable tablet 81 mg  cefTRIAXone (ROCEPHIN) 1 g in dextrose 5 % 50 mL IVPB  enoxaparin (LOVENOX) injection 40 mg  furosemide (LASIX) tablet 40 mg  lisinopril (PRINIVIL,ZESTRIL) tablet 5 mg  loratadine (CLARITIN) tablet 10 mg  nitroGLYCERIN (NITROSTAT) SL tablet 0.4 mg  potassium chloride (K-DUR,KLOR-CON) CR tablet 10 mEq  senna-docusate (Senokot-S) tablet 1 tablet   Technical Summary: A multichannel digital EEG recording measured by the international 10-20 system with electrodes applied with paste and impedances below 5000 ohms performed in our laboratory with EKG monitoring in an awake and asleep patient.  Hyperventilation and photic stimulation were not performed.  The digital EEG was referentially recorded, reformatted, and digitally filtered in a variety of bipolar and referential montages for optimal display. Patient has a burr hole at C4, moved 0.5cm to the left.  Description: The patient is awake and asleep during the recording.  During maximal wakefulness, there is a symmetric, medium voltage 10.5-11 Hz posterior dominant rhythm that attenuates with eye opening.  There is occasional focal slowing over the right frontocentral region. Breach artifact with higher amplitude and sharply contoured wave forms is seen over the right central region. During drowsiness and sleep, there is an increase in theta slowing of the background, asymmetric vertex waves and symmetric sleep spindles were seen better over the right hemisphere.  Hyperventilation and photic stimulation were not performed.  There were no epileptiform discharges or electrographic seizures seen.    EKG lead was  unremarkable.  Impression: This awake and asleep EEG is abnormal due to occasional focal slowing over the right frontocentral region. Breach artifact is seen over the right central region.  Clinical Correlation of the above findings indicates focal cerebral dysfunction over the right frontocentral region suggestive of underlying structural or physiologic abnormality. Breach artifact is consistent with prior surgery in this region. The absence of epileptiform discharges does not exclude a clinical diagnosis of epilepsy. Clinical correlation is advised.    Patrcia DollyKaren Verdis Bassette, M.D.

## 2016-06-02 ENCOUNTER — Observation Stay (HOSPITAL_BASED_OUTPATIENT_CLINIC_OR_DEPARTMENT_OTHER): Payer: Medicare HMO

## 2016-06-02 DIAGNOSIS — G451 Carotid artery syndrome (hemispheric): Secondary | ICD-10-CM | POA: Diagnosis not present

## 2016-06-02 DIAGNOSIS — G459 Transient cerebral ischemic attack, unspecified: Secondary | ICD-10-CM

## 2016-06-02 DIAGNOSIS — I6601 Occlusion and stenosis of right middle cerebral artery: Secondary | ICD-10-CM

## 2016-06-02 DIAGNOSIS — N3000 Acute cystitis without hematuria: Secondary | ICD-10-CM | POA: Diagnosis not present

## 2016-06-02 DIAGNOSIS — E876 Hypokalemia: Secondary | ICD-10-CM | POA: Diagnosis not present

## 2016-06-02 LAB — BASIC METABOLIC PANEL
Anion gap: 6 (ref 5–15)
BUN: 10 mg/dL (ref 6–20)
CHLORIDE: 107 mmol/L (ref 101–111)
CO2: 29 mmol/L (ref 22–32)
Calcium: 9.3 mg/dL (ref 8.9–10.3)
Creatinine, Ser: 1.15 mg/dL — ABNORMAL HIGH (ref 0.44–1.00)
GFR calc Af Amer: 51 mL/min — ABNORMAL LOW (ref >60)
GFR calc non Af Amer: 44 mL/min — ABNORMAL LOW (ref >60)
GLUCOSE: 93 mg/dL (ref 65–99)
POTASSIUM: 4.3 mmol/L (ref 3.5–5.1)
SODIUM: 142 mmol/L (ref 135–145)

## 2016-06-02 LAB — HEMOGLOBIN A1C
Hgb A1c MFr Bld: 5.2 % (ref 4.8–5.6)
Mean Plasma Glucose: 103 mg/dL

## 2016-06-02 LAB — ECHOCARDIOGRAM COMPLETE
HEIGHTINCHES: 60 in
WEIGHTICAEL: 4105.85 [oz_av]

## 2016-06-02 MED ORDER — SODIUM CHLORIDE 0.9 % IV SOLN
INTRAVENOUS | Status: DC
  Administered 2016-06-03: 04:00:00 via INTRAVENOUS

## 2016-06-02 MED ORDER — FUROSEMIDE 20 MG PO TABS
20.0000 mg | ORAL_TABLET | Freq: Every day | ORAL | Status: DC
  Administered 2016-06-03: 20 mg via ORAL
  Filled 2016-06-02: qty 1

## 2016-06-02 NOTE — Progress Notes (Signed)
  Echocardiogram 2D Echocardiogram has been performed.  Cathie BeamsGREGORY, Karsten Howry 06/02/2016, 11:46 AM

## 2016-06-02 NOTE — Progress Notes (Signed)
[facility] PROGRESS NOTE    Progress Note  Jill FischerMargaret A Chapman  ZOX:[id number] DOB: 10/01/1933 DOA: 06/01/2016 PCP: Lucilla EdinAUB, STEVE A, MD     Brief Narrative:   Jill FischerMargaret A Allbee is an 82 y.o. female past medical history of hypertension and CVA who presents after being slumped over on the toilet with slurred speech and facial droop, MRI showed slow flow in the right middle cerebral artery. No acute infarcts, neurology was consulted and recommended a 30 greater than 100 as an outpatient, dual antiplatelet therapy for 3 months, from there on Plavix alone, also recommended to keep a long-term goal blood pressure 130 to 150 due to the right MCA occlusion  Assessment/Plan:   TIA (transient ischemic attack): CTA of the neck showed right MCA and the occlusion, long-term goal blood pressure as an outpatient is 1:30 to 150 due to right MCA occlusion PT, OT, Speech consult pending Echocardiogram pendig CTA showed right MCA occlusion. Prophylactic therapy-Antiplatelet med: To antiplatelet therapy for 3 months, then Plavix from there on. Discontinue cardiac Monitoring. Continue high-dose statins  Essential hypertension: Allow permissive hypertension. Slowly trended down. Continue hold lisinopril  Hypokalemia Replete orally recheck a magnesium level.  Infection of urinary tract Agree with IV Rocephin, urine culture grew mother 100,000 colonies of gram-negative rods. Interestingly since presentation.  Lower extremity edema: Decrease Lasix dose.  DVT prophylaxis: lovenox Family Communication:none Disposition Plan/Barrier to D/C: home in 1 day. Code Status:     Code Status Orders        Start     Ordered   06/01/16 0346  Full code  Continuous     06/01/16 0349    Code Status History    Date Active Date Inactive Code Status Order ID Comments User Context   This patient has a current code status but no historical code status.        IV Access:    Peripheral  IV   Procedures and diagnostic studies:   Ct Angio Head W Or Wo Contrast  Result Date: 06/01/2016 CLINICAL DATA:  Dysarthria and left facial droop with spontaneous normalization. EXAM: CT ANGIOGRAPHY HEAD AND NECK TECHNIQUE: Multidetector CT imaging of the head and neck was performed using the standard protocol during bolus administration of intravenous contrast. Multiplanar CT image reconstructions and MIPs were obtained to evaluate the vascular anatomy. Carotid stenosis measurements (when applicable) are obtained utilizing NASCET criteria, using the distal internal carotid diameter as the denominator. CONTRAST:  50 cc Isovue 370 intravenous COMPARISON:  06/01/2016 brain MRI. FINDINGS: CT HEAD FINDINGS Brain: No evidence of acute infarction, hemorrhage, hydrocephalus, extra-axial collection or mass lesion/mass effect. Generalized atrophy. Very mild for age chronic microvascular ischemic change Vascular: Atherosclerotic calcification.  No hyperdense vessel. Skull: Previous right-sided craniotomy. Based on report from 2004 head CTs this was for subdural hematoma evacuation. No changes of ECA to MCA bypass. Sinuses: Negative Orbits: Negative Review of the MIP images confirms the above findings CTA NECK FINDINGS Aortic arch: Diffuse atherosclerotic plaque. No acute finding. Two vessel branching pattern. Borderline aneurysmal ascending aorta measuring up to 39 mm diameter. Right carotid system: Mild atherosclerotic plaque at the carotid bifurcation, calcified. No stenosis, dissection, or beading. Left carotid system: Mild atherosclerotic plaque, calcified, at common carotid bifurcation. No stenosis, dissection, or ulceration. Vertebral arteries: No proximal subclavian flow limiting stenosis. The vertebral arteries are smooth and widely patent. Skeleton: Cervical degenerative change. There is a large periapical erosion around the left lower canine which has contiguous soft tissue thickening that is  presumably  granulation tissue. Other neck: No incidental mass or adenopathy in the neck. Upper chest: Airway thickening with possible mild emphysema. Review of the MIP images confirms the above findings CTA HEAD FINDINGS Anterior circulation: Atherosclerotic plaque on the carotid siphons without stenosis. The right M1 segment is occluded at its origin with a small anterior temporal branch that is stenotic proximally. There is asymmetric underfilling of right MCA vessels. Large right posterior communicating artery with fetal PCA. An anterior communicating artery is not visualized. Moderate atheromatous stenosis at the left MCA bifurcation. Posterior circulation: Mild left vertebral artery dominance. Moderate proximal basilar stenosis, atheromatous appearing. Moderate atherosclerotic irregularity the bilateral proximal PCA. No major branch occlusion. Negative for aneurysm. Venous sinuses: Patent as permitted by timing. Anatomic variants: None other than described above Delayed phase: No parenchymal enhancement or mass. Review of the MIP images confirms the above findings IMPRESSION: 1. Chronic proximal right M1 occlusion. 2. Moderate atheromatous narrowing of the proximal basilar, bilateral proximal PCA, and left MCA bifurcation. 3. No arterial stenosis in the neck. 4. Remote right craniotomy. From 2004 head CT reports this was for subdural hematoma evacuation. 5. Left lower canine has a large periapical erosion with thickened and enhancing overlying soft tissues that is presumably granulation tissue. Cannot exclude a skin fistula or tumor. Correlate with exam. Electronically Signed   By: Marnee Spring M.D.   On: 06/01/2016 18:07   Ct Angio Neck W Or Wo Contrast  Result Date: 06/01/2016 CLINICAL DATA:  Dysarthria and left facial droop with spontaneous normalization. EXAM: CT ANGIOGRAPHY HEAD AND NECK TECHNIQUE: Multidetector CT imaging of the head and neck was performed using the standard protocol during bolus  administration of intravenous contrast. Multiplanar CT image reconstructions and MIPs were obtained to evaluate the vascular anatomy. Carotid stenosis measurements (when applicable) are obtained utilizing NASCET criteria, using the distal internal carotid diameter as the denominator. CONTRAST:  50 cc Isovue 370 intravenous COMPARISON:  06/01/2016 brain MRI. FINDINGS: CT HEAD FINDINGS Brain: No evidence of acute infarction, hemorrhage, hydrocephalus, extra-axial collection or mass lesion/mass effect. Generalized atrophy. Very mild for age chronic microvascular ischemic change Vascular: Atherosclerotic calcification.  No hyperdense vessel. Skull: Previous right-sided craniotomy. Based on report from 2004 head CTs this was for subdural hematoma evacuation. No changes of ECA to MCA bypass. Sinuses: Negative Orbits: Negative Review of the MIP images confirms the above findings CTA NECK FINDINGS Aortic arch: Diffuse atherosclerotic plaque. No acute finding. Two vessel branching pattern. Borderline aneurysmal ascending aorta measuring up to 39 mm diameter. Right carotid system: Mild atherosclerotic plaque at the carotid bifurcation, calcified. No stenosis, dissection, or beading. Left carotid system: Mild atherosclerotic plaque, calcified, at common carotid bifurcation. No stenosis, dissection, or ulceration. Vertebral arteries: No proximal subclavian flow limiting stenosis. The vertebral arteries are smooth and widely patent. Skeleton: Cervical degenerative change. There is a large periapical erosion around the left lower canine which has contiguous soft tissue thickening that is presumably granulation tissue. Other neck: No incidental mass or adenopathy in the neck. Upper chest: Airway thickening with possible mild emphysema. Review of the MIP images confirms the above findings CTA HEAD FINDINGS Anterior circulation: Atherosclerotic plaque on the carotid siphons without stenosis. The right M1 segment is occluded at its  origin with a small anterior temporal branch that is stenotic proximally. There is asymmetric underfilling of right MCA vessels. Large right posterior communicating artery with fetal PCA. An anterior communicating artery is not visualized. Moderate atheromatous stenosis at the left MCA bifurcation. Posterior circulation:  Mild left vertebral artery dominance. Moderate proximal basilar stenosis, atheromatous appearing. Moderate atherosclerotic irregularity the bilateral proximal PCA. No major branch occlusion. Negative for aneurysm. Venous sinuses: Patent as permitted by timing. Anatomic variants: None other than described above Delayed phase: No parenchymal enhancement or mass. Review of the MIP images confirms the above findings IMPRESSION: 1. Chronic proximal right M1 occlusion. 2. Moderate atheromatous narrowing of the proximal basilar, bilateral proximal PCA, and left MCA bifurcation. 3. No arterial stenosis in the neck. 4. Remote right craniotomy. From 2004 head CT reports this was for subdural hematoma evacuation. 5. Left lower canine has a large periapical erosion with thickened and enhancing overlying soft tissues that is presumably granulation tissue. Cannot exclude a skin fistula or tumor. Correlate with exam. Electronically Signed   By: Marnee SpringJonathon  Watts M.D.   On: 06/01/2016 18:07   Mr Brain Wo Contrast  Result Date: 06/01/2016 CLINICAL DATA:  Found slumped over with slurred speech and facial droop. Patient found in the skin addition on toilet seat at 11:15 p.m. last evening. EXAM: MRI HEAD WITHOUT CONTRAST MRA HEAD WITHOUT CONTRAST TECHNIQUE: Multiplanar, multiecho pulse sequences of the brain and surrounding structures were obtained without intravenous contrast. Angiographic images of the head were obtained using MRA technique without contrast. COMPARISON:  CT head without contrast from the same day. MRI brain 04/13/2011. FINDINGS: MRI HEAD FINDINGS Brain: Right craniotomy is again noted. The  diffusion-weighted images demonstrate no evidence for acute or subacute infarcts. Remote lacunar infarcts are present within basal ganglia and posterior limb internal capsule bilaterally. These have progressed since the prior exam. No acute infarcts are present. Mild periventricular white matter changes have progressed slightly as well. There is minimal encephalomalacia within the right frontal operculum. The ventricles are proportionate to the degree of atrophy. Insert pass fluid Vascular: Abnormal signal is present in the right M1 segment. Flow is otherwise present within the major intracranial arteries. Skull and upper cervical spine: Internal carotid arteries demonstrate mild atherosclerotic changes within the cavernous segments bilaterally. Sinuses/Orbits: A chronic polyp or mucous retention cysts is stable in the medial posterior right left maxillary sinus. The paranasal sinuses and mastoid air cells are otherwise clear. The globes and orbits are intact. MRA HEAD FINDINGS There is minimal atherosclerotic irregularity within the left M1 and A1 segment. Mild to moderate distal left M1 segment narrowing is present. There is marked signal loss within the right M1 segment suggesting slow or occluded flow. The right A1 segment is normal. There is some attenuation of distal ACA branch vessels bilaterally. Moderate MCA attenuation is present on the left distal to the bifurcation. Mild narrowing is present in the distal vertebral arteries bilaterally. The left vertebral artery is the dominant vessel. There is mild narrowing of the mid basilar artery of less than 50%. The distal basilar artery is normal. The left posterior cerebral artery originates from the basilar tip. The right posterior cerebral artery is of fetal type. There is moderate attenuation PCA branch vessels bilaterally. IMPRESSION: 1. Slow or occluded flow within the right middle cerebral artery from the level of the ICA terminus without evidence for  acute infarct or hemorrhage. 2. Moderate diffuse medium and small vessel disease compatible with extensive intracranial atherosclerotic disease. 3. Slight progression of multiple remote lacunar infarcts within the basal ganglia. 4. Slight progression of diffuse white matter disease compatible with the extensive medium and small vessel disease. 5. Right frontal and parietal craniotomy. Electronically Signed   By: Marin Robertshristopher  Mattern M.D.   On: 06/01/2016  09:32   Mr Maxine Glenn Head/brain NW Cm  Result Date: 06/01/2016 CLINICAL DATA:  Found slumped over with slurred speech and facial droop. Patient found in the skin addition on toilet seat at 11:15 p.m. last evening. EXAM: MRI HEAD WITHOUT CONTRAST MRA HEAD WITHOUT CONTRAST TECHNIQUE: Multiplanar, multiecho pulse sequences of the brain and surrounding structures were obtained without intravenous contrast. Angiographic images of the head were obtained using MRA technique without contrast. COMPARISON:  CT head without contrast from the same day. MRI brain 04/13/2011. FINDINGS: MRI HEAD FINDINGS Brain: Right craniotomy is again noted. The diffusion-weighted images demonstrate no evidence for acute or subacute infarcts. Remote lacunar infarcts are present within basal ganglia and posterior limb internal capsule bilaterally. These have progressed since the prior exam. No acute infarcts are present. Mild periventricular white matter changes have progressed slightly as well. There is minimal encephalomalacia within the right frontal operculum. The ventricles are proportionate to the degree of atrophy. Insert pass fluid Vascular: Abnormal signal is present in the right M1 segment. Flow is otherwise present within the major intracranial arteries. Skull and upper cervical spine: Internal carotid arteries demonstrate mild atherosclerotic changes within the cavernous segments bilaterally. Sinuses/Orbits: A chronic polyp or mucous retention cysts is stable in the medial posterior  right left maxillary sinus. The paranasal sinuses and mastoid air cells are otherwise clear. The globes and orbits are intact. MRA HEAD FINDINGS There is minimal atherosclerotic irregularity within the left M1 and A1 segment. Mild to moderate distal left M1 segment narrowing is present. There is marked signal loss within the right M1 segment suggesting slow or occluded flow. The right A1 segment is normal. There is some attenuation of distal ACA branch vessels bilaterally. Moderate MCA attenuation is present on the left distal to the bifurcation. Mild narrowing is present in the distal vertebral arteries bilaterally. The left vertebral artery is the dominant vessel. There is mild narrowing of the mid basilar artery of less than 50%. The distal basilar artery is normal. The left posterior cerebral artery originates from the basilar tip. The right posterior cerebral artery is of fetal type. There is moderate attenuation PCA branch vessels bilaterally. IMPRESSION: 1. Slow or occluded flow within the right middle cerebral artery from the level of the ICA terminus without evidence for acute infarct or hemorrhage. 2. Moderate diffuse medium and small vessel disease compatible with extensive intracranial atherosclerotic disease. 3. Slight progression of multiple remote lacunar infarcts within the basal ganglia. 4. Slight progression of diffuse white matter disease compatible with the extensive medium and small vessel disease. 5. Right frontal and parietal craniotomy. Electronically Signed   By: Marin Roberts M.D.   On: 06/01/2016 09:32   Ct Head Code Stroke W/o Cm  Result Date: 06/01/2016 CLINICAL DATA:  Code stroke. Left-sided facial droop and left-sided weakness with history of old with cerebral vascular accident. Slurred speech resolved. EXAM: CT HEAD WITHOUT CONTRAST TECHNIQUE: Contiguous axial images were obtained from the base of the skull through the vertex without intravenous contrast. COMPARISON:   04/12/2011 CT head. FINDINGS: Brain: No evidence of acute infarction, hemorrhage, hydrocephalus, extra-axial collection or mass lesion/mass effect. Lucency in right posterior limb of internal capsule corresponding to prior infarct. Additional small lucencies in the basal ganglia bilaterally may represent prominent perivascular spaces or chronic lacunar infarcts. Mild chronic microvascular ischemic changes of the brain parenchyma. Mild brain parenchymal volume loss progressed from 2012. Vascular: No hyperdense vessel identified. Moderate calcific atherosclerosis of carotid siphons. Skull: Postsurgical changes related to right frontal  temporal craniotomy. Sinuses/Orbits: Left maxillary sinus small mucous retention cyst. Otherwise visualized paranasal sinuses and mastoid air cells are normally aerated. Opacification of external auditory canals is likely cerumen. Other: None. ASPECTS [facility] Stroke Program Early CT Score) - Ganglionic level infarction (caudate, lentiform nuclei, internal capsule, insula, M1-M3 cortex): 7 - Supraganglionic infarction (M4-M6 cortex): 3 Total score (0-10 with 10 being normal): 10 IMPRESSION: 1. No acute intracranial abnormality identified. 2. Mild chronic microvascular ischemic changes and parenchymal volume loss is progressed from 2012. 3. Small lucencies in the basal ganglia bilaterally probably represent chronic lacunar infarcts. 4. ASPECTS is 10 These results were called by telephone at the time of interpretation on 06/01/2016 at 1:24 am to Dr. Amada Jupiter, who verbally acknowledged these results. Electronically Signed   By: Mitzi Hansen M.D.   On: 06/01/2016 01:26     Medical Consultants:    None.  Anti-Infectives:   rocephin  Subjective:    [name] no complains  Objective:    Vitals:   06/01/16 1813 06/01/16 2144 06/02/16 0053 06/02/16 0500  BP: 107/72 112/68 104/72 119/71  Pulse: 77 80 78 60  Resp: 16 16 16 16   Temp: 98.4 F (36.9 C)  98 F (36.7 C) 98.5 F (36.9 C) 98.5 F (36.9 C)  TempSrc: Oral Axillary Oral Oral  SpO2: 100% 97% 98% 100%  Weight:      Height:       No intake or output data in the 24 hours ending 06/02/16 1610 Filed Weights   06/01/16 0400  Weight: 116.4 kg (256 lb 9.9 oz)    Exam: General exam: In no acute distress. Respiratory system: Good air movement and clear to auscultation. Cardiovascular system: S1 & S2 heard, RRR. No JVD, murmurs, rubs, gallops or clicks.  Gastrointestinal system: Abdomen is nondistended, soft and nontender.  Extremities: No pedal edema. Skin: No rashes, lesions or ulcers Psychiatry: Judgement and insight appear normal. Mood & affect appropriate.    Data Reviewed:    Labs: Basic Metabolic Panel:  Recent Labs Lab 06/01/16 0058 06/01/16 0111 06/02/16 0243  NA 142 144 142  K 3.2* 3.3* 4.3  CL 105 102 107  CO2 29  --  29  GLUCOSE 100* 103* 93  BUN 11 11 10   CREATININE 1.08* 1.20* 1.15*  CALCIUM 10.0  --  9.3   GFR Estimated Creatinine Clearance: 45.5 mL/min (by C-G formula based on SCr of 1.15 mg/dL (H)). Liver Function Tests:  Recent Labs Lab 06/01/16 0058  AST 20  ALT 11*  ALKPHOS 70  BILITOT 0.8  PROT 7.3  ALBUMIN 3.9   No results for input(s): LIPASE, AMYLASE in the last 168 hours. No results for input(s): AMMONIA in the last 168 hours. Coagulation profile  Recent Labs Lab 06/01/16 0058  INR 1.11    CBC:  Recent Labs Lab 06/01/16 0058 06/01/16 0111  WBC 6.7  --   NEUTROABS 5.0  --   HGB 15.0 15.3*  HCT 44.5 45.0  MCV 93.7  --   PLT 152  --    Cardiac Enzymes: No results for input(s): CKTOTAL, CKMB, CKMBINDEX, TROPONINI in the last 168 hours. BNP (last 3 results) No results for input(s): PROBNP in the last 8760 hours. CBG:  Recent Labs Lab 06/01/16 0124  GLUCAP 119*   D-Dimer: No results for input(s): DDIMER in the last 72 hours. Hgb A1c:  Recent Labs  06/01/16 0111  HGBA1C 5.2   Lipid  Profile:  Recent Labs  06/01/16 0111  CHOL 219*  HDL 54  LDLCALC 151*  TRIG 72  CHOLHDL 4.1   Thyroid function studies: No results for input(s): TSH, T4TOTAL, T3FREE, THYROIDAB in the last 72 hours.  Invalid input(s): FREET3 Anemia work up: No results for input(s): VITAMINB12, FOLATE, FERRITIN, TIBC, IRON, RETICCTPCT in the last 72 hours. Sepsis Labs:  Recent Labs Lab 06/01/16 0058  WBC 6.7   Microbiology Recent Results (from the past 240 hour(s))  Culture, Urine     Status: Abnormal (Preliminary result)   Collection Time: 06/01/16  2:45 AM  Result Value Ref Range Status   Specimen Description URINE, RANDOM  Final   Special Requests NONE  Final   Culture >=100,000 COLONIES/mL GRAM NEGATIVE RODS (A)  Final   Report Status PENDING  Incomplete     Medications:   . aspirin EC  325 mg Oral Daily  . atorvastatin  80 mg Oral q1800  . cefTRIAXone (ROCEPHIN)  IV  1 g Intravenous Q24H  . clopidogrel  75 mg Oral Daily  . enoxaparin (LOVENOX) injection  40 mg Subcutaneous Q24H  . furosemide  40 mg Oral Daily  . lisinopril  5 mg Oral Daily  . loratadine  10 mg Oral Daily  . potassium chloride  10 mEq Oral Daily   Continuous Infusions:  Time spent: 25 min   LOS: 0 days   Marinda Elk  [facility] Pager [phone]  *Please refer to [email or web address], password [email or web address] to get updated schedule on who will round on this patient, as hospitalists switch teams weekly. If 7PM-7AM, please contact night-coverage at [email or web address], password [email or web address] for any overnight needs.  06/02/2016, 9:22 AM

## 2016-06-02 NOTE — Progress Notes (Signed)
STROKE TEAM PROGRESS NOTE   SUBJECTIVE (INTERVAL HISTORY) No family at bedside. She is sitting in chair. Emphasized BP management and goal at 130-150 due to right MCA occlusion. She has no complains. No acute issue overnight.     OBJECTIVE Temp:  [98 F (36.7 C)-98.7 F (37.1 C)] 98.6 F (37 C) (11/23 2118) Pulse Rate:  [60-101] 101 (11/23 2118) Cardiac Rhythm: Normal sinus rhythm (11/23 2012) Resp:  [16-18] 18 (11/23 2118) BP: (104-145)/(68-96) 145/96 (11/23 2118) SpO2:  [98 %-100 %] 100 % (11/23 2118)  CBC:   Recent Labs Lab 06/01/16 0058 06/01/16 0111  WBC 6.7  --   NEUTROABS 5.0  --   HGB 15.0 15.3*  HCT 44.5 45.0  MCV 93.7  --   PLT 152  --     Basic Metabolic Panel:   Recent Labs Lab 06/01/16 0058 06/01/16 0111 06/02/16 0243  NA 142 144 142  K 3.2* 3.3* 4.3  CL 105 102 107  CO2 29  --  29  GLUCOSE 100* 103* 93  BUN 11 11 10   CREATININE 1.08* 1.20* 1.15*  CALCIUM 10.0  --  9.3    Lipid Panel:     Component Value Date/Time   CHOL 219 (H) 06/01/2016 0111   TRIG 72 06/01/2016 0111   HDL 54 06/01/2016 0111   CHOLHDL 4.1 06/01/2016 0111   VLDL 14 06/01/2016 0111   LDLCALC 151 (H) 06/01/2016 0111   HgbA1c:  Lab Results  Component Value Date   HGBA1C 5.2 06/01/2016   Urine Drug Screen:     Component Value Date/Time   LABOPIA NONE DETECTED 06/01/2016 0245   COCAINSCRNUR NONE DETECTED 06/01/2016 0245   LABBENZ NONE DETECTED 06/01/2016 0245   AMPHETMU NONE DETECTED 06/01/2016 0245   THCU NONE DETECTED 06/01/2016 0245   LABBARB NONE DETECTED 06/01/2016 0245      IMAGING I have personally reviewed the radiological images below and agree with the radiology interpretations.  Ct Head Code Stroke W/o Cm 06/01/2016 1. No acute intracranial abnormality identified. 2. Mild chronic microvascular ischemic changes and parenchymal volume loss is progressed from 2012. 3. Small lucencies in the basal ganglia bilaterally probably represent chronic lacunar  infarcts. 4. ASPECTS is 10   Mri and Mra Brain Wo Contrast 06/01/2016 IMPRESSION: 1. Slow or occluded flow within the right middle cerebral artery from the level of the ICA terminus without evidence for acute infarct or hemorrhage. 2. Moderate diffuse medium and small vessel disease compatible with extensive intracranial atherosclerotic disease. 3. Slight progression of multiple remote lacunar infarcts within the basal ganglia. 4. Slight progression of diffuse white matter disease compatible with the extensive medium and small vessel disease. 5. Right frontal and parietal craniotomy.   EEG  This awake and asleep EEG is abnormal due to occasional focal slowing over the right frontocentral region. Breach artifact is seen over the right central region. Clinical Correlation of the above findings indicates focal cerebral dysfunction over the right frontocentral region suggestive of underlying structural or physiologic abnormality. Breach artifact is consistent with prior surgery in this region. The absence of epileptiform discharges does not exclude a clinical diagnosis of epilepsy. Clinical correlation is advised.  CTA head and neck  1. Chronic proximal right M1 occlusion. 2. Moderate atheromatous narrowing of the proximal basilar, bilateral proximal PCA, and left MCA bifurcation. 3. No arterial stenosis in the neck. 4. Remote right craniotomy. From 2004 head CT reports this was for subdural hematoma evacuation. 5. Left lower canine has a large  periapical erosion with thickened and enhancing overlying soft tissues that is presumably granulation tissue. Cannot exclude a skin fistula or tumor. Correlate with exam.  TTE   Mild basal septal LV hypertrophy with LVEF 55-60%. Prominent   apical left ventricular trabeculation with apparent false tendon.   Grade 1 diastolic dysfunction. Trivial mitral regurgitation.   Mildly sclerotic aortic valve. Trivial tricuspid regurgitation.   Interatrial septum  is hypermobile, no obvious PFO. Calcified   echodensity noted on the septal margin of the right atrium near   the tricuspid annulus, etiology uncertain. This would be an   unusual position for an intracardiac mass or thrombus. If   clinically indicated, could consider a TEE or possibly cardiac   MRI.  TEE pending   PHYSICAL EXAM  Temp:  [98 F (36.7 C)-98.7 F (37.1 C)] 98.6 F (37 C) (11/23 2118) Pulse Rate:  [60-101] 101 (11/23 2118) Resp:  [16-18] 18 (11/23 2118) BP: (104-145)/(68-96) 145/96 (11/23 2118) SpO2:  [98 %-100 %] 100 % (11/23 2118)  General - Well nourished, well developed, in no apparent distress.  Ophthalmologic - Fundi not visualized due to eye movement.  Cardiovascular - Regular rate and rhythm.  Mental Status -  Level of arousal and orientation to month, place, and person were intact, however not orientated to year. Language including expression, naming, repetition, comprehension was assessed and found intact.  Cranial Nerves II - XII - II - Visual field intact OU. III, IV, VI - Extraocular movements intact. V - Facial sensation intact bilaterally. VII - Facial movement intact bilaterally. VIII - Hearing & vestibular intact bilaterally. X - Palate elevates symmetrically. XI - Chin turning & shoulder shrug intact bilaterally. XII - Tongue protrusion intact.  Motor Strength - The patient's strength was normal in all extremities and pronator drift was absent.  Bulk was normal and fasciculations were absent.   Motor Tone - Muscle tone was assessed at the neck and appendages and was normal.  Reflexes - The patient's reflexes were 1+ in all extremities and she had no pathological reflexes.  Sensory - Light touch, temperature/pinprick were assessed and were symmetrical.    Coordination - The patient had normal movements in the hands with no ataxia or dysmetria.  Tremor was absent.  Gait and Station - deferred   ASSESSMENT/PLAN [name] is  a 82 y.o. female with history of HTN, CAD, SDH presenting with slurred speech and facial droop. She did not receive IV t-PA due to rapidly improving symptoms.   TIA:  Right brain TIA due to right MCA occlusion, likely chronic  Resultant  No neuro deficit  MRI  No acute infarct  MRA slow or occluded flow within the right middle cerebral artery from the level of the ICA terminus. Also left PCA stenosis  CTA head and neck moderate stenosis at proximal basilar, bilateral proximal PCA, and left MCA bifurcation  2D Echo - RA echodensity, no obvious PFO  Recommend TEE to further evaluate RA echodensity  Recommend 30 day cardiac event monitoring as out pt to rule out afib  LDL 151  HgbA1c 5.2  Lovenox 40 mg sq daily for VTE prophylaxis Diet Heart Room service appropriate? Yes; Fluid consistency: Thin Diet NPO time specified Except for: Sips with Meds  aspirin 81 mg daily prior to admission, now on DAPT for intracranial stenosis. Recommend to continue DAPT on discharge for 3 months and then plavix alone.  Patient counseled to be compliant with her antithrombotic medications  Ongoing aggressive stroke  risk factor management  Therapy recommendations:  pending   Disposition:  Anticipate return home  Right MCA occlusion  CTA head and neck pending to confirm occlusion  Most likely chronic   recommend 30 day cardiac event monitoring to rule out afib  Continue DAPT and high dose statin for maximized medical treatment  Hypertension  Stable  Permissive hypertension (OK if < 220/120) but gradually normalize in 5-7 days  Long-term BP goal 130 -150 due to right MCA occlusion  Avoid hypotension  Orthostatic vitals indicating no orthostatic hypotension this time  Hyperlipidemia  Home meds:  No statin  LDL 151, goal < 70  Add lipitor 80mg   Continue statin at discharge  Other Stroke Risk Factors  Advanced age  Former Cigarette smoker  ETOH use, advised to drink no more  than 1 drink(s) a day  Morbid Obesity, Body mass index is 50.12 kg/m., recommend weight loss, diet and exercise as appropriate   Hx stroke/TIA  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 After hours, contact General Neurology

## 2016-06-03 ENCOUNTER — Encounter (HOSPITAL_COMMUNITY): Payer: Self-pay | Admitting: *Deleted

## 2016-06-03 ENCOUNTER — Encounter (HOSPITAL_COMMUNITY)
Admission: EM | Disposition: A | Discharge status: Home / Self Care | Payer: Self-pay | Source: Home / Self Care | Attending: Emergency Medicine

## 2016-06-03 ENCOUNTER — Other Ambulatory Visit: Payer: Self-pay | Admitting: Physician Assistant

## 2016-06-03 ENCOUNTER — Observation Stay (HOSPITAL_BASED_OUTPATIENT_CLINIC_OR_DEPARTMENT_OTHER): Payer: Medicare HMO

## 2016-06-03 ENCOUNTER — Observation Stay (HOSPITAL_COMMUNITY): Payer: Medicare HMO | Admitting: Anesthesiology

## 2016-06-03 DIAGNOSIS — I639 Cerebral infarction, unspecified: Secondary | ICD-10-CM

## 2016-06-03 DIAGNOSIS — I10 Essential (primary) hypertension: Secondary | ICD-10-CM | POA: Diagnosis not present

## 2016-06-03 DIAGNOSIS — Z8673 Personal history of transient ischemic attack (TIA), and cerebral infarction without residual deficits: Secondary | ICD-10-CM | POA: Diagnosis not present

## 2016-06-03 DIAGNOSIS — G451 Carotid artery syndrome (hemispheric): Secondary | ICD-10-CM | POA: Diagnosis not present

## 2016-06-03 HISTORY — PX: TEE WITHOUT CARDIOVERSION: SHX5443

## 2016-06-03 LAB — BASIC METABOLIC PANEL
Anion gap: 10 (ref 5–15)
BUN: 12 mg/dL (ref 6–20)
CALCIUM: 9.7 mg/dL (ref 8.9–10.3)
CO2: 29 mmol/L (ref 22–32)
CREATININE: 1.04 mg/dL — AB (ref 0.44–1.00)
Chloride: 103 mmol/L (ref 101–111)
GFR calc Af Amer: 57 mL/min — ABNORMAL LOW (ref >60)
GFR, EST NON AFRICAN AMERICAN: 49 mL/min — AB (ref >60)
Glucose, Bld: 123 mg/dL — ABNORMAL HIGH (ref 65–99)
POTASSIUM: 4.2 mmol/L (ref 3.5–5.1)
SODIUM: 142 mmol/L (ref 135–145)

## 2016-06-03 LAB — URINE CULTURE: Culture: >=100000 — AB

## 2016-06-03 SURGERY — ECHOCARDIOGRAM, TRANSESOPHAGEAL
Anesthesia: Monitor Anesthesia Care

## 2016-06-03 MED ORDER — BUTAMBEN-TETRACAINE-BENZOCAINE 2-2-14 % EX AERO
INHALATION_SPRAY | CUTANEOUS | Status: DC | PRN
  Administered 2016-06-03: 2 via TOPICAL

## 2016-06-03 MED ORDER — CEPHALEXIN 250 MG PO CAPS: 250.0000 mg | ORAL_CAPSULE | Freq: Two times a day (BID) | ORAL | 0 refills | Status: DC

## 2016-06-03 MED ORDER — ASPIRIN EC 81 MG PO TBEC
81.0000 mg | DELAYED_RELEASE_TABLET | Freq: Every day | ORAL | Status: DC
  Administered 2016-06-03: 81 mg via ORAL
  Filled 2016-06-03: qty 1

## 2016-06-03 MED ORDER — ATORVASTATIN CALCIUM 40 MG PO TABS: 40.0000 mg | ORAL_TABLET | Freq: Every day | ORAL | 0 refills | Status: DC

## 2016-06-03 MED ORDER — MIDAZOLAM HCL 5 MG/ML IJ SOLN
INTRAMUSCULAR | Status: AC
  Filled 2016-06-03: qty 2

## 2016-06-03 MED ORDER — FENTANYL CITRATE (PF) 100 MCG/2ML IJ SOLN
INTRAMUSCULAR | Status: AC
  Filled 2016-06-03: qty 2

## 2016-06-03 MED ORDER — SODIUM CHLORIDE 0.9 % IV SOLN: INTRAVENOUS | Status: DC

## 2016-06-03 MED ORDER — ATORVASTATIN CALCIUM 80 MG PO TABS: 80.0000 mg | ORAL_TABLET | Freq: Every day | ORAL | 0 refills | Status: DC

## 2016-06-03 MED ORDER — LIDOCAINE VISCOUS 2 % MT SOLN
OROMUCOSAL | Status: AC
  Filled 2016-06-03: qty 15

## 2016-06-03 MED ORDER — LIDOCAINE VISCOUS 2 % MT SOLN
OROMUCOSAL | Status: DC | PRN
  Administered 2016-06-03: 1 via OROMUCOSAL

## 2016-06-03 MED ORDER — FENTANYL CITRATE (PF) 100 MCG/2ML IJ SOLN
INTRAMUSCULAR | Status: DC | PRN
  Administered 2016-06-03: 25 ug via INTRAVENOUS

## 2016-06-03 MED ORDER — MIDAZOLAM HCL 10 MG/2ML IJ SOLN
INTRAMUSCULAR | Status: DC | PRN
  Administered 2016-06-03: 1 mg via INTRAVENOUS

## 2016-06-03 MED ORDER — CLOPIDOGREL BISULFATE 75 MG PO TABS: 75.0000 mg | ORAL_TABLET | Freq: Every day | ORAL | 0 refills | Status: DC

## 2016-06-03 MED ORDER — CEPHALEXIN 250 MG PO CAPS
250.0000 mg | ORAL_CAPSULE | Freq: Two times a day (BID) | ORAL | Status: DC
  Administered 2016-06-03: 250 mg via ORAL
  Filled 2016-06-03: qty 1

## 2016-06-03 NOTE — Progress Notes (Signed)
STROKE TEAM PROGRESS NOTE   SUBJECTIVE (INTERVAL HISTORY) PT is at bedside working with her. She had TEE done this am and showed no mass in RA but very small PFO with ASA.    OBJECTIVE Temp:  [98 F (36.7 C)-98.6 F (37 C)] 98.1 F (36.7 C) (11/24 0901) Pulse Rate:  [62-104] 73 (11/24 1125) Cardiac Rhythm: Normal sinus rhythm (11/24 0420) Resp:  [14-23] 16 (11/24 1125) BP: (117-166)/(54-111) 163/87 (11/24 1125) SpO2:  [96 %-100 %] 100 % (11/24 1125)  CBC:   Recent Labs Lab 06/01/16 0058 06/01/16 0111  WBC 6.7  --   NEUTROABS 5.0  --   HGB 15.0 15.3*  HCT 44.5 45.0  MCV 93.7  --   PLT 152  --     Basic Metabolic Panel:   Recent Labs Lab 06/02/16 0243 06/03/16 0226  NA 142 142  K 4.3 4.2  CL 107 103  CO2 29 29  GLUCOSE 93 123*  BUN 10 12  CREATININE 1.15* 1.04*  CALCIUM 9.3 9.7    Lipid Panel:     Component Value Date/Time   CHOL 219 (H) 06/01/2016 0111   TRIG 72 06/01/2016 0111   HDL 54 06/01/2016 0111   CHOLHDL 4.1 06/01/2016 0111   VLDL 14 06/01/2016 0111   LDLCALC 151 (H) 06/01/2016 0111   HgbA1c:  Lab Results  Component Value Date   HGBA1C 5.2 06/01/2016   Urine Drug Screen:     Component Value Date/Time   LABOPIA NONE DETECTED 06/01/2016 0245   COCAINSCRNUR NONE DETECTED 06/01/2016 0245   LABBENZ NONE DETECTED 06/01/2016 0245   AMPHETMU NONE DETECTED 06/01/2016 0245   THCU NONE DETECTED 06/01/2016 0245   LABBARB NONE DETECTED 06/01/2016 0245      IMAGING I have personally reviewed the radiological images below and agree with the radiology interpretations.  Ct Head Code Stroke W/o Cm 06/01/2016 1. No acute intracranial abnormality identified.  2. Mild chronic microvascular ischemic changes and parenchymal volume loss is progressed from 2012.  3. Small lucencies in the basal ganglia bilaterally probably represent chronic lacunar infarcts.  4. ASPECTS is 10    Mri and Mra Brain Wo Contrast 06/01/2016 IMPRESSION:  1. Slow or  occluded flow within the right middle cerebral artery from the level of the ICA terminus without evidence for acute infarct or hemorrhage.  2. Moderate diffuse medium and small vessel disease compatible with extensive intracranial atherosclerotic disease.  3. Slight progression of multiple remote lacunar infarcts within the basal ganglia.  4. Slight progression of diffuse white matter disease compatible with the extensive medium and small vessel disease.  5. Right frontal and parietal craniotomy.   EEG  This awake and asleep EEG is abnormal due to occasional focal slowing over the right frontocentral region. Breach artifact is seen over the right central region. Clinical Correlation of the above findings indicates focal cerebral dysfunction over the right frontocentral region suggestive of underlying structural or physiologic abnormality. Breach artifact is consistent with prior surgery in this region. The absence of epileptiform discharges does not exclude a clinical diagnosis of epilepsy. Clinical correlation is advised.  CTA head and neck  1. Chronic proximal right M1 occlusion. 2. Moderate atheromatous narrowing of the proximal basilar, bilateral proximal PCA, and left MCA bifurcation. 3. No arterial stenosis in the neck. 4. Remote right craniotomy. From 2004 head CT reports this was for subdural hematoma evacuation. 5. Left lower canine has a large periapical erosion with thickened and enhancing overlying soft tissues that  is presumably granulation tissue. Cannot exclude a skin fistula or tumor. Correlate with exam.  TTE   Mild basal septal LV hypertrophy with LVEF 55-60%. Prominent   apical left ventricular trabeculation with apparent false tendon.   Grade 1 diastolic dysfunction. Trivial mitral regurgitation.   Mildly sclerotic aortic valve. Trivial tricuspid regurgitation.   Interatrial septum is hypermobile, no obvious PFO. Calcified   echodensity noted on the septal margin of the  right atrium near   the tricuspid annulus, etiology uncertain. This would be an   unusual position for an intracardiac mass or thrombus. If   clinically indicated, could consider a TEE or possibly cardiac   MRI.  TEE 06/03/2016  Aneurysmal interatrial septum with a very small PFO which shunted right to left intermittently. No mass noted in the RA - tricuspid annular calcification is noted.    PHYSICAL EXAM  Temp:  [98 F (36.7 C)-98.6 F (37 C)] 98.1 F (36.7 C) (11/24 0901) Pulse Rate:  [62-104] 73 (11/24 1125) Resp:  [14-23] 16 (11/24 1125) BP: (117-166)/(54-111) 163/87 (11/24 1125) SpO2:  [96 %-100 %] 100 % (11/24 1125)  General - Well nourished, well developed, in no apparent distress.  Ophthalmologic - Fundi not visualized due to eye movement.  Cardiovascular - Regular rate and rhythm.  Mental Status -  Level of arousal and orientation to month, place, and person were intact, however not orientated to year. Language including expression, naming, repetition, comprehension was assessed and found intact.  Cranial Nerves II - XII - II - Visual field intact OU. III, IV, VI - Extraocular movements intact. V - Facial sensation intact bilaterally. VII - Facial movement intact bilaterally. VIII - Hearing & vestibular intact bilaterally. X - Palate elevates symmetrically. XI - Chin turning & shoulder shrug intact bilaterally. XII - Tongue protrusion intact.  Motor Strength - The patient's strength was normal in all extremities and pronator drift was absent.  Bulk was normal and fasciculations were absent.   Motor Tone - Muscle tone was assessed at the neck and appendages and was normal.  Reflexes - The patient's reflexes were 1+ in all extremities and she had no pathological reflexes.  Sensory - Light touch, temperature/pinprick were assessed and were symmetrical.    Coordination - The patient had normal movements in the hands with no ataxia or dysmetria.  Tremor was  absent.  Gait and Station - deferred   ASSESSMENT/PLAN Ms. [name] is a 82 y.o. female with history of HTN, CAD, SDH presenting with slurred speech and facial droop. She did not receive IV t-PA due to rapidly improving symptoms.   TIA:  Right brain TIA due to right MCA occlusion, likely chronic  Resultant  No neuro deficit  MRI  No acute infarct  MRA slow or occluded flow within the right middle cerebral artery from the level of the ICA terminus. Also left PCA stenosis  CTA head and neck moderate stenosis at proximal basilar, bilateral proximal PCA, and left MCA bifurcation  2D Echo - RA echodensity, no obvious PFO  TEE - no mass at RA but very small PFO with ASA  EEG - occasional focal slowing over the right frontocentral region with breach rhythm  Recommend 30 day cardiac event monitoring as out pt to rule out afib  LDL 151  HgbA1c 5.2  Lovenox 40 mg sq daily for VTE prophylaxis Diet NPO time specified  aspirin 81 mg daily prior to admission, now on DAPT for intracranial stenosis. Recommend to continue DAPT  on discharge for 3 months and then plavix alone.  Patient counseled to be compliant with her antithrombotic medications  Ongoing aggressive stroke risk factor management  Therapy recommendations:  pending   Disposition:  Anticipate return home  Right MCA occlusion  CTA head and neck - chronic proximal right M1 occlusion.  Recommend 30 day cardiac event monitoring to rule out afib  Continue DAPT and high dose statin for maximized medical treatment  Hypertension  Stable  Permissive hypertension (OK if < 220/120) but gradually normalize in 5-7 days  Long-term BP goal 130 -150 due to right MCA occlusion  Avoid hypotension  Orthostatic vitals indicating no orthostatic hypotension this time  Hyperlipidemia  Home meds:  No statin  LDL 151, goal < 70  Add lipitor 80mg   Continue statin at discharge  Other Stroke Risk Factors  Advanced  age  Former Cigarette smoker  ETOH use, advised to drink no more than 1 drink(s) a day  Morbid Obesity, Body mass index is 50.12 kg/m., recommend weight loss, diet and exercise as appropriate   Hx stroke/TIA  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 Please call with questions. [facility]. Thanks for the consult.  Jill PlanJindong Antwonette Feliz, MD PhD Stroke Neurology 06/03/2016 3:22 PM   To contact Stroke Continuity provider, please refer to [email or web address]. After hours, contact General Neurology

## 2016-06-03 NOTE — Anesthesia Preprocedure Evaluation (Deleted)
Anesthesia Evaluation  Patient identified by MRN, date of birth, ID band Patient awake    Reviewed: Allergy & Precautions, NPO status , Patient's Chart, lab work & pertinent test results  Airway Mallampati: II  TM Distance: >3 FB Neck ROM: Full    Dental no notable dental hx.    Pulmonary neg pulmonary ROS, former smoker,    breath sounds clear to auscultation + decreased breath sounds      Cardiovascular hypertension, + CAD and + Cardiac Stents  Normal cardiovascular exam Rhythm:Regular Rate:Normal     Neuro/Psych TIACVA negative psych ROS   GI/Hepatic negative GI ROS, Neg liver ROS,   Endo/Other  Morbid obesity  Renal/GU negative Renal ROS  negative genitourinary   Musculoskeletal negative musculoskeletal ROS (+)   Abdominal   Peds negative pediatric ROS (+)  Hematology negative hematology ROS (+)   Anesthesia Other Findings   Reproductive/Obstetrics negative OB ROS                             Anesthesia Physical Anesthesia Plan  ASA: IV  Anesthesia Plan: MAC   Post-op Pain Management:    Induction: Intravenous  Airway Management Planned: Nasal Cannula  Additional Equipment:   Intra-op Plan:   Post-operative Plan:   Informed Consent: I have reviewed the patients History and Physical, chart, labs and discussed the procedure including the risks, benefits and alternatives for the proposed anesthesia with the patient or authorized representative who has indicated his/her understanding and acceptance.   Dental advisory given  Plan Discussed with: CRNA and Surgeon  Anesthesia Plan Comments:         Anesthesia Quick Evaluation

## 2016-06-03 NOTE — CV Procedure (Signed)
TRANSESOPHAGEAL ECHOCARDIOGRAM (TEE) NOTE  INDICATIONS: cryptogenic stroke  PROCEDURE:   Informed consent was obtained prior to the procedure. The risks, benefits and alternatives for the procedure were discussed and the patient comprehended these risks.  Risks include, but are not limited to, cough, sore throat, vomiting, nausea, somnolence, esophageal and stomach trauma or perforation, bleeding, low blood pressure, aspiration, pneumonia, infection, trauma to the teeth and death.    After a procedural time-out, the patient was given 1 mg versed and 25 mcg fentanyl for moderate sedation.  The patient's heart rate, blood pressure, and oxygen saturation are monitored continuously during the procedure.The oropharynx was anesthetized 10 cc of topical 1% viscous lidocaine and 2 cetacaine sprays.  The transesophageal probe was inserted in the esophagus and stomach without difficulty and multiple views were obtained.  The patient was kept under observation until the patient left the procedure room.  The period of conscious sedation is 21 minutes, of which I was present face-to-face 100% of this time. The patient left the procedure room in stable condition.   Agitated microbubble saline contrast was administered.  COMPLICATIONS:    There were no immediate complications.  Findings:  1. LEFT VENTRICLE: The left ventricular wall thickness is mildly increased.  The left ventricular cavity is normal in size. Wall motion is normal.  LVEF is 55-60%.  2. RIGHT VENTRICLE:  The right ventricle is normal in structure and function without any thrombus or masses.    3. LEFT ATRIUM:  The left atrium is upper normal in size without any thrombus or masses.  There is not spontaneous echo contrast ("smoke") in the left atrium consistent with a low flow state.  4. LEFT ATRIAL APPENDAGE:  The left atrial appendage is free of any thrombus or masses. The appendage has single lobes. Pulse doppler indicates high flow  in the appendage.  5. ATRIAL SEPTUM:  The atrial septum is aneurysmal and demonstrates a small PFO with intermittent right to left flow with valsalva, but not spontaneously. This was demonstrated by color doppler and saline microbubble contrast.  6. RIGHT ATRIUM:  The right atrium is normal in size and function without any thrombus or masses. There is tricuspid annular calcification.  7. MITRAL VALVE:  The mitral valve is normal in structure and function with trivial regurgitation.  There were no vegetations or stenosis.  8. AORTIC VALVE:  The aortic valve is trileaflet and sclerotic with no regurgitation.  There were no vegetations or stenosis  9. TRICUSPID VALVE:  The tricuspid valve is normal in structure and function with trace to mild regurgitation.  There were no vegetations or stenosis  10.  PULMONIC VALVE:  The pulmonic valve is normal in structure and function with no regurgitation.  There were no vegetations or stenosis.   11. AORTIC ARCH, ASCENDING AND DESCENDING AORTA:  There was grade 2 Myrtis Ser(Katz et. Al, 1992) atherosclerosis of the aortic arch and proximal descending aorta.  12. PULMONARY VEINS: Anomalous pulmonary venous return was not noted.  13. PERICARDIUM: The pericardium appeared normal and non-thickened.  There is no pericardial effusion.  IMPRESSION:   1. No LAA thrombus 2. Atrial septal aneurysm with small PFO and intermittent right to left shunting with valsalva 3. Sclerotic aortic valve with mitral and tricuspid annular calcification 4. LVEF 55-60% 5. Moderate atherosclerosis of the aortic arch and proximal descending aorta  RECOMMENDATIONS:    1. Aneurysmal interatrial septum with a very small PFO which shunted right to left intermittently. No mass noted in  the RA - tricuspid annular calcification is noted.   Time Spent Directly with the Patient:  35 minutes   Chrystie NoseKenneth C. Raechelle Sarti, MD, [facility]ACC Attending Cardiologist [facility]  06/03/2016, 11:35 AM

## 2016-06-03 NOTE — Progress Notes (Signed)
  Echocardiogram Transesophageal has been performed.  Arvil ChacoFoster, Ivanell Deshotel 06/03/2016, 11:48 AM

## 2016-06-03 NOTE — Progress Notes (Signed)
Patient's daughter wanted to speak to the MD regarding patient's result/ MD paged to notify

## 2016-06-03 NOTE — Progress Notes (Signed)
Pt transported off unit to Endo for procedure. P. Amo Zahriah Roes RN 

## 2016-06-03 NOTE — Discharge Summary (Signed)
Physician Discharge Summary  Jill FischerMargaret A Chapman RUE:[id number]:[id number] DOB: 01/20/1934 DOA: 06/01/2016  PCP: Lucilla EdinAUB, Jill A, MD  Admit date: 06/01/2016 Discharge date: 06/03/2016  Time spent: 35 minutes  Recommendations for Outpatient Follow-up:  30 day [facility] Dr.Xu in 1 month PCP Dr.Daub in 1 week, started on Plavix and Lipitor, and ASA continued at discharge  Discharge Diagnoses:  Principal Problem:   TIA (transient ischemic attack) Active Problems:   Essential hypertension   History of CVA (cerebrovascular accident)   Edema   Hypokalemia   Infection of urinary tract   Occlusion of right middle cerebral artery   Discharge Condition: stable  Diet recommendation: heart healthy  Filed Weights   06/01/16 0400  Weight: 116.4 kg (256 lb 9.9 oz)    History of present illness:  Jill FischerMargaret A Deeney is an 82 y.o. female past medical history of hypertension and CVA who presents after being slumped over on the toilet with slurred speech and facial droop,  Hospital Course:  TIA:  - Right brain TIA due to right MCA occlusion, likely chronic per NEurology - admitted with slurring of speech and facial droop, symptoms completely resolved - MRI  No acute infarct - MRA slow or occluded flow within the right middle cerebral artery from the level of the ICA terminus. Also left PCA stenosis -CTA head and neck moderate stenosis at proximal basilar, bilateral proximal PCA, and left MCA bifurcation -TEE: normal EF , no thrombus -LDL 151-started statin -was on ASA 81mg  prior to admission, now Plavix added for dual antiplatelet therapy for 3months followed by Plavix alone -30day monitor recommended [facility]  Right MCA occlusion -CTA head and neck - chronic proximal right M1 occlusion. -30day monitor recommended to r/o Afib -continue ASA and plavix with statin at discharge  Hypertension -stable, resumed home  meds at  discharge  Hyperlipidemia -LDL 151, started on lipitor  Tobacco abuse -counseled  Ecoli UTI treated with IV ceftriaxone and changed to Keflex at discharge for 3more days   Chronic leg edema -on lasix at home  Procedures: TEE:  Consultations: Neurology  Discharge Exam: Vitals:   06/03/16 1140 06/03/16 1224  BP: 131/87 124/82  Pulse: 72 66  Resp: 20 18  Temp:  98.1 F (36.7 C)    General: AAOx3 Cardiovascular: S1S2/RRR Respiratory: CTAB  Discharge Instructions   Discharge Instructions    Ambulatory referral to Neurology    Complete by:  As directed    Pt [facility]. Thanks.   Diet - low sodium heart healthy    Complete by:  As directed    Increase activity slowly    Complete by:  As directed      Current Discharge Medication List    START taking these medications   Details  atorvastatin (LIPITOR) 80 MG tablet Take 1 tablet (80 mg total) by mouth daily at 6 PM. Qty: 30 tablet, Refills: 0    cephALEXin (KEFLEX) 250 MG capsule Take 1 capsule (250 mg total) by mouth every 12 (twelve) hours. For 3days for Urinary infection Qty: 6 capsule, Refills: 0    clopidogrel (PLAVIX) 75 MG tablet Take 1 tablet (75 mg total) by mouth daily. Qty: 30 tablet, Refills: 0      CONTINUE these medications which have NOT CHANGED   Details  aspirin 81 MG tablet Take 81 mg by mouth daily.    cetirizine (ZYRTEC) 10 MG  tablet Take 10 mg by mouth daily as needed. Reported on 11/07/2015    lisinopril (PRINIVIL,ZESTRIL) 5 MG tablet take 1 tablet by mouth once daily Qty: 90 tablet, Refills: 0    nitroGLYCERIN (NITROSTAT) 0.4 MG SL tablet place 1 tablet under the tongue every 5 minutes if needed for chest pain Qty: 25 tablet, Refills: 6    potassium chloride (MICRO-K) 10 MEQ CR capsule take 1 capsule by mouth once daily Qty: 30 capsule, Refills: 0    furosemide (LASIX) 20 MG tablet take 1 tablet by mouth once daily Qty: 30 tablet,  Refills: 0       Allergies  Allergen Reactions  . Latex Itching  . Flagyl [Metronidazole] Rash   Follow-up Information    DAUB, Jill A, MD. Schedule an appointment as soon as possible for a visit in 1 week(s).   Specialty:  Family Medicine Contact information: [address] [place] [address] [phone]        Chapman,Jindong, MD. Schedule an appointment as soon as possible for a visit in 6 week(s).   Specialty:  Neurology Contact information: [address] [place] [address] [phone]            The results of significant diagnostics from this hospitalization (including imaging, microbiology, ancillary and laboratory) are listed below for reference.    Significant Diagnostic Studies: Ct Angio Head W Or Wo Contrast  Result Date: 06/01/2016 CLINICAL DATA:  Dysarthria and left facial droop with spontaneous normalization. EXAM: CT ANGIOGRAPHY HEAD AND NECK TECHNIQUE: Multidetector CT imaging of the head and neck was performed using the standard protocol during bolus administration of intravenous contrast. Multiplanar CT image reconstructions and MIPs were obtained to evaluate the vascular anatomy. Carotid stenosis measurements (when applicable) are obtained utilizing NASCET criteria, using the distal internal carotid diameter as the denominator. CONTRAST:  50 cc Isovue 370 intravenous COMPARISON:  06/01/2016 brain MRI. FINDINGS: CT HEAD FINDINGS Brain: No evidence of acute infarction, hemorrhage, hydrocephalus, extra-axial collection or mass lesion/mass effect. Generalized atrophy. Very mild for age chronic microvascular ischemic change Vascular: Atherosclerotic calcification.  No hyperdense vessel. Skull: Previous right-sided craniotomy. Based on report from 2004 head CTs this was for subdural hematoma evacuation. No changes of ECA to MCA bypass. Sinuses: Negative Orbits: Negative Review of the MIP images confirms the above findings CTA NECK FINDINGS  Aortic arch: Diffuse atherosclerotic plaque. No acute finding. Two vessel branching pattern. Borderline aneurysmal ascending aorta measuring up to 39 mm diameter. Right carotid system: Mild atherosclerotic plaque at the carotid bifurcation, calcified. No stenosis, dissection, or beading. Left carotid system: Mild atherosclerotic plaque, calcified, at common carotid bifurcation. No stenosis, dissection, or ulceration. Vertebral arteries: No proximal subclavian flow limiting stenosis. The vertebral arteries are smooth and widely patent. Skeleton: Cervical degenerative change. There is a large periapical erosion around the left lower canine which has contiguous soft tissue thickening that is presumably granulation tissue. Other neck: No incidental mass or adenopathy in the neck. Upper chest: Airway thickening with possible mild emphysema. Review of the MIP images confirms the above findings CTA HEAD FINDINGS Anterior circulation: Atherosclerotic plaque on the carotid siphons without stenosis. The right M1 segment is occluded at its origin with a small anterior temporal branch that is stenotic proximally. There is asymmetric underfilling of right MCA vessels. Large right posterior communicating artery with fetal PCA. An anterior communicating artery is not visualized. Moderate atheromatous stenosis at the left MCA bifurcation. Posterior circulation: Mild left vertebral artery dominance. Moderate proximal basilar  stenosis, atheromatous appearing. Moderate atherosclerotic irregularity the bilateral proximal PCA. No major branch occlusion. Negative for aneurysm. Venous sinuses: Patent as permitted by timing. Anatomic variants: None other than described above Delayed phase: No parenchymal enhancement or mass. Review of the MIP images confirms the above findings IMPRESSION: 1. Chronic proximal right M1 occlusion. 2. Moderate atheromatous narrowing of the proximal basilar, bilateral proximal PCA, and left MCA bifurcation. 3.  No arterial stenosis in the neck. 4. Remote right craniotomy. From 2004 head CT reports this was for subdural hematoma evacuation. 5. Left lower canine has a large periapical erosion with thickened and enhancing overlying soft tissues that is presumably granulation tissue. Cannot exclude a skin fistula or tumor. Correlate with exam. Electronically Signed   By: Marnee Spring M.D.   On: 06/01/2016 18:07   Ct Angio Neck W Or Wo Contrast  Result Date: 06/01/2016 CLINICAL DATA:  Dysarthria and left facial droop with spontaneous normalization. EXAM: CT ANGIOGRAPHY HEAD AND NECK TECHNIQUE: Multidetector CT imaging of the head and neck was performed using the standard protocol during bolus administration of intravenous contrast. Multiplanar CT image reconstructions and MIPs were obtained to evaluate the vascular anatomy. Carotid stenosis measurements (when applicable) are obtained utilizing NASCET criteria, using the distal internal carotid diameter as the denominator. CONTRAST:  50 cc Isovue 370 intravenous COMPARISON:  06/01/2016 brain MRI. FINDINGS: CT HEAD FINDINGS Brain: No evidence of acute infarction, hemorrhage, hydrocephalus, extra-axial collection or mass lesion/mass effect. Generalized atrophy. Very mild for age chronic microvascular ischemic change Vascular: Atherosclerotic calcification.  No hyperdense vessel. Skull: Previous right-sided craniotomy. Based on report from 2004 head CTs this was for subdural hematoma evacuation. No changes of ECA to MCA bypass. Sinuses: Negative Orbits: Negative Review of the MIP images confirms the above findings CTA NECK FINDINGS Aortic arch: Diffuse atherosclerotic plaque. No acute finding. Two vessel branching pattern. Borderline aneurysmal ascending aorta measuring up to 39 mm diameter. Right carotid system: Mild atherosclerotic plaque at the carotid bifurcation, calcified. No stenosis, dissection, or beading. Left carotid system: Mild atherosclerotic plaque,  calcified, at common carotid bifurcation. No stenosis, dissection, or ulceration. Vertebral arteries: No proximal subclavian flow limiting stenosis. The vertebral arteries are smooth and widely patent. Skeleton: Cervical degenerative change. There is a large periapical erosion around the left lower canine which has contiguous soft tissue thickening that is presumably granulation tissue. Other neck: No incidental mass or adenopathy in the neck. Upper chest: Airway thickening with possible mild emphysema. Review of the MIP images confirms the above findings CTA HEAD FINDINGS Anterior circulation: Atherosclerotic plaque on the carotid siphons without stenosis. The right M1 segment is occluded at its origin with a small anterior temporal branch that is stenotic proximally. There is asymmetric underfilling of right MCA vessels. Large right posterior communicating artery with fetal PCA. An anterior communicating artery is not visualized. Moderate atheromatous stenosis at the left MCA bifurcation. Posterior circulation: Mild left vertebral artery dominance. Moderate proximal basilar stenosis, atheromatous appearing. Moderate atherosclerotic irregularity the bilateral proximal PCA. No major branch occlusion. Negative for aneurysm. Venous sinuses: Patent as permitted by timing. Anatomic variants: None other than described above Delayed phase: No parenchymal enhancement or mass. Review of the MIP images confirms the above findings IMPRESSION: 1. Chronic proximal right M1 occlusion. 2. Moderate atheromatous narrowing of the proximal basilar, bilateral proximal PCA, and left MCA bifurcation. 3. No arterial stenosis in the neck. 4. Remote right craniotomy. From 2004 head CT reports this was for subdural hematoma evacuation. 5. Left lower canine has  a large periapical erosion with thickened and enhancing overlying soft tissues that is presumably granulation tissue. Cannot exclude a skin fistula or tumor. Correlate with exam.  Electronically Signed   By: Marnee SpringJonathon  Chapman M.D.   On: 06/01/2016 18:07   Mr Brain Wo Contrast  Result Date: 06/01/2016 CLINICAL DATA:  Found slumped over with slurred speech and facial droop. Patient found in the skin addition on toilet seat at 11:15 p.m. last evening. EXAM: MRI HEAD WITHOUT CONTRAST MRA HEAD WITHOUT CONTRAST TECHNIQUE: Multiplanar, multiecho pulse sequences of the brain and surrounding structures were obtained without intravenous contrast. Angiographic images of the head were obtained using MRA technique without contrast. COMPARISON:  CT head without contrast from the same day. MRI brain 04/13/2011. FINDINGS: MRI HEAD FINDINGS Brain: Right craniotomy is again noted. The diffusion-weighted images demonstrate no evidence for acute or subacute infarcts. Remote lacunar infarcts are present within basal ganglia and posterior limb internal capsule bilaterally. These have progressed since the prior exam. No acute infarcts are present. Mild periventricular white matter changes have progressed slightly as well. There is minimal encephalomalacia within the right frontal operculum. The ventricles are proportionate to the degree of atrophy. Insert pass fluid Vascular: Abnormal signal is present in the right M1 segment. Flow is otherwise present within the major intracranial arteries. Skull and upper cervical spine: Internal carotid arteries demonstrate mild atherosclerotic changes within the cavernous segments bilaterally. Sinuses/Orbits: A chronic polyp or mucous retention cysts is stable in the medial posterior right left maxillary sinus. The paranasal sinuses and mastoid air cells are otherwise clear. The globes and orbits are intact. MRA HEAD FINDINGS There is minimal atherosclerotic irregularity within the left M1 and A1 segment. Mild to moderate distal left M1 segment narrowing is present. There is marked signal loss within the right M1 segment suggesting slow or occluded flow. The right A1 segment  is normal. There is some attenuation of distal ACA branch vessels bilaterally. Moderate MCA attenuation is present on the left distal to the bifurcation. Mild narrowing is present in the distal vertebral arteries bilaterally. The left vertebral artery is the dominant vessel. There is mild narrowing of the mid basilar artery of less than 50%. The distal basilar artery is normal. The left posterior cerebral artery originates from the basilar tip. The right posterior cerebral artery is of fetal type. There is moderate attenuation PCA branch vessels bilaterally. IMPRESSION: 1. Slow or occluded flow within the right middle cerebral artery from the level of the ICA terminus without evidence for acute infarct or hemorrhage. 2. Moderate diffuse medium and small vessel disease compatible with extensive intracranial atherosclerotic disease. 3. Slight progression of multiple remote lacunar infarcts within the basal ganglia. 4. Slight progression of diffuse white matter disease compatible with the extensive medium and small vessel disease. 5. Right frontal and parietal craniotomy. Electronically Signed   By: Jill Robertshristopher  Mattern M.D.   On: 06/01/2016 09:32   Mr Jill GlennMra Head/brain WUWo Cm  Result Date: 06/01/2016 CLINICAL DATA:  Found slumped over with slurred speech and facial droop. Patient found in the skin addition on toilet seat at 11:15 p.m. last evening. EXAM: MRI HEAD WITHOUT CONTRAST MRA HEAD WITHOUT CONTRAST TECHNIQUE: Multiplanar, multiecho pulse sequences of the brain and surrounding structures were obtained without intravenous contrast. Angiographic images of the head were obtained using MRA technique without contrast. COMPARISON:  CT head without contrast from the same day. MRI brain 04/13/2011. FINDINGS: MRI HEAD FINDINGS Brain: Right craniotomy is again noted. The diffusion-weighted images demonstrate no evidence  for acute or subacute infarcts. Remote lacunar infarcts are present within basal ganglia and posterior  limb internal capsule bilaterally. These have progressed since the prior exam. No acute infarcts are present. Mild periventricular white matter changes have progressed slightly as well. There is minimal encephalomalacia within the right frontal operculum. The ventricles are proportionate to the degree of atrophy. Insert pass fluid Vascular: Abnormal signal is present in the right M1 segment. Flow is otherwise present within the major intracranial arteries. Skull and upper cervical spine: Internal carotid arteries demonstrate mild atherosclerotic changes within the cavernous segments bilaterally. Sinuses/Orbits: A chronic polyp or mucous retention cysts is stable in the medial posterior right left maxillary sinus. The paranasal sinuses and mastoid air cells are otherwise clear. The globes and orbits are intact. MRA HEAD FINDINGS There is minimal atherosclerotic irregularity within the left M1 and A1 segment. Mild to moderate distal left M1 segment narrowing is present. There is marked signal loss within the right M1 segment suggesting slow or occluded flow. The right A1 segment is normal. There is some attenuation of distal ACA branch vessels bilaterally. Moderate MCA attenuation is present on the left distal to the bifurcation. Mild narrowing is present in the distal vertebral arteries bilaterally. The left vertebral artery is the dominant vessel. There is mild narrowing of the mid basilar artery of less than 50%. The distal basilar artery is normal. The left posterior cerebral artery originates from the basilar tip. The right posterior cerebral artery is of fetal type. There is moderate attenuation PCA branch vessels bilaterally. IMPRESSION: 1. Slow or occluded flow within the right middle cerebral artery from the level of the ICA terminus without evidence for acute infarct or hemorrhage. 2. Moderate diffuse medium and small vessel disease compatible with extensive intracranial atherosclerotic disease. 3. Slight  progression of multiple remote lacunar infarcts within the basal ganglia. 4. Slight progression of diffuse white matter disease compatible with the extensive medium and small vessel disease. 5. Right frontal and parietal craniotomy. Electronically Signed   By: [name] M.D.   On: 06/01/2016 09:32   Ct Head Code Stroke W/o Cm  Result Date: 06/01/2016 CLINICAL DATA:  Code stroke. Left-sided facial droop and left-sided weakness with history of old with cerebral vascular accident. Slurred speech resolved. EXAM: CT HEAD WITHOUT CONTRAST TECHNIQUE: Contiguous axial images were obtained from the base of the skull through the vertex without intravenous contrast. COMPARISON:  04/12/2011 CT head. FINDINGS: Brain: No evidence of acute infarction, hemorrhage, hydrocephalus, extra-axial collection or mass lesion/mass effect. Lucency in right posterior limb of internal capsule corresponding to prior infarct. Additional small lucencies in the basal ganglia bilaterally may represent prominent perivascular spaces or chronic lacunar infarcts. Mild chronic microvascular ischemic changes of the brain parenchyma. Mild brain parenchymal volume loss progressed from 2012. Vascular: No hyperdense vessel identified. Moderate calcific atherosclerosis of carotid siphons. Skull: Postsurgical changes related to right frontal temporal craniotomy. Sinuses/Orbits: Left maxillary sinus small mucous retention cyst. Otherwise visualized paranasal sinuses and mastoid air cells are normally aerated. Opacification of external auditory canals is likely cerumen. Other: None. ASPECTS [facility] Stroke Program Early CT Score) - Ganglionic level infarction (caudate, lentiform nuclei, internal capsule, insula, M1-M3 cortex): 7 - Supraganglionic infarction (M4-M6 cortex): 3 Total score (0-10 with 10 being normal): 10 IMPRESSION: 1. No acute intracranial abnormality identified. 2. Mild chronic microvascular ischemic changes and parenchymal volume  loss is progressed from 2012. 3. Small lucencies in the basal ganglia bilaterally probably represent chronic lacunar infarcts. 4. ASPECTS is 10  These results were called by telephone at the time of interpretation on 06/01/2016 at 1:24 am to Dr. Amada Chapman, who verbally acknowledged these results. Electronically Signed   By: Mitzi Hansen M.D.   On: 06/01/2016 01:26    Microbiology: Recent Results (from the past 240 hour(s))  Culture, Urine     Status: Abnormal   Collection Time: 06/01/16  2:45 AM  Result Value Ref Range Status   Specimen Description URINE, RANDOM  Final   Special Requests NONE  Final   Culture >=100,000 COLONIES/mL ESCHERICHIA COLI (A)  Final   Report Status 06/03/2016 FINAL  Final   Organism ID, Bacteria ESCHERICHIA COLI (A)  Final      Susceptibility   Escherichia coli - MIC*    AMPICILLIN 8 SENSITIVE Sensitive     CEFAZOLIN <=4 SENSITIVE Sensitive     CEFTRIAXONE <=1 SENSITIVE Sensitive     CIPROFLOXACIN <=0.25 SENSITIVE Sensitive     GENTAMICIN 2 SENSITIVE Sensitive     IMIPENEM <=0.25 SENSITIVE Sensitive     NITROFURANTOIN <=16 SENSITIVE Sensitive     TRIMETH/SULFA <=20 SENSITIVE Sensitive     AMPICILLIN/SULBACTAM <=2 SENSITIVE Sensitive     PIP/TAZO <=4 SENSITIVE Sensitive     Extended ESBL NEGATIVE Sensitive     * >=100,000 COLONIES/mL ESCHERICHIA COLI     Labs: Basic Metabolic Panel:  Recent Labs Lab 06/01/16 0058 06/01/16 0111 06/02/16 0243 06/03/16 0226  NA 142 144 142 142  K 3.2* 3.3* 4.3 4.2  CL 105 102 107 103  CO2 29  --  29 29  GLUCOSE 100* 103* 93 123*  BUN 11 11 10 12   CREATININE 1.08* 1.20* 1.15* 1.04*  CALCIUM 10.0  --  9.3 9.7   Liver Function Tests:  Recent Labs Lab 06/01/16 0058  AST 20  ALT 11*  ALKPHOS 70  BILITOT 0.8  PROT 7.3  ALBUMIN 3.9   No results for input(s): LIPASE, AMYLASE in the last 168 hours. No results for input(s): AMMONIA in the last 168 hours. CBC:  Recent Labs Lab 06/01/16 0058  06/01/16 0111  WBC 6.7  --   NEUTROABS 5.0  --   HGB 15.0 15.3*  HCT 44.5 45.0  MCV 93.7  --   PLT 152  --    Cardiac Enzymes: No results for input(s): CKTOTAL, CKMB, CKMBINDEX, TROPONINI in the last 168 hours. BNP: BNP (last 3 results) No results for input(s): BNP in the last 8760 hours.  ProBNP (last 3 results) No results for input(s): PROBNP in the last 8760 hours.  CBG:  Recent Labs Lab 06/01/16 0124  GLUCAP 119*       SignedZannie Cove MD.  [facility] 06/03/2016, 4:08 PM

## 2016-06-03 NOTE — Progress Notes (Signed)
Pt discharge education and instructions completed with pt and daughter at bedside. All voices understanding and denies any questions. Pt IV and telemetry removed; pt discharge home with daughter to transport her home. Pt handed her prescriptions for Keflex, Lipitor and Plavix. Pt transported off unit via wheelchair with belongings and family to the side. Dionne BucyP. Amo Nyjah Denio RN

## 2016-06-03 NOTE — Evaluation (Signed)
Physical Therapy Evaluation Patient Details Name: CARRIANNE CUTTING MRN: [id number] DOB: Mar 18, 1934 Today's Date: 06/03/2016   History of Present Illness  82 y.o.femalewith history of HTN, CAD, SDH presenting with slurred speech and facial droop. 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 All education has been completed and the patient has no further questions.  PT is signing off. Thank you for this referral.     Follow Up Recommendations No PT follow up    Equipment Recommendations  None recommended by PT    Recommendations for Other Services       Precautions / Restrictions        Mobility  Bed Mobility Overal bed mobility: Modified Independent             General bed mobility comments: incr time   Transfers Overall transfer level: Independent Equipment used: None             General transfer comment: x 3 various surfaces  Ambulation/Gait Ambulation/Gait assistance: Min guard;Independent Ambulation Distance (Feet): 250 Feet Assistive device: None Gait Pattern/deviations: WFL(Within Functional Limits);Decreased stride length Gait velocity: decr, only slight incr when attempts Gait velocity interpretation: Below normal speed for age/gender    Stairs            Wheelchair Mobility    Modified Rankin (Stroke Patients Only) Modified Rankin (Stroke Patients Only) Pre-Morbid Rankin Score: Slight disability Modified Rankin: Slight disability     Balance Overall balance assessment: Independent                                           Pertinent Vitals/Pain Pain Assessment: No/denies pain    Home Living Family/patient expects to be discharged to:: Private residence Living Arrangements: Alone Available Help at Discharge: Family;Available PRN/intermittently Type of Home: Independent living facility Home Access:  Level entry     Home Layout: One level Home Equipment: Cane - single point      Prior Function Level of Independence: Independent         Comments: states does not use her cane     Hand Dominance   Dominant Hand: Right    Extremity/Trunk Assessment   Upper Extremity Assessment: Generalized weakness           Lower Extremity Assessment: Generalized weakness      Cervical / Trunk Assessment: Kyphotic  Communication   Communication: No difficulties  Cognition Arousal/Alertness: Awake/alert Behavior During Therapy: WFL for tasks assessed/performed Overall Cognitive Status: No family/caregiver present to determine baseline cognitive functioning       Memory: Decreased short-term memory (repeating questions, comments up to 3 times in 30 min )              General Comments      Exercises     Assessment/Plan    PT Assessment Patent does not need any further PT services  PT Problem List            PT Treatment Interventions      PT Goals (Current goals can be found in the Care Plan section)  Acute Rehab PT Goals Patient Stated Goal: go home PT Goal Formulation: All assessment and education complete, DC therapy    Frequency     Barriers to discharge  Co-evaluation               End of Session Equipment Utilized During Treatment: Gait belt Activity Tolerance: Patient tolerated treatment well Patient left: in chair;with call bell/phone within reach;with chair alarm set      Functional Assessment Tool Used: clinical judgement Functional Limitation: Mobility: Walking and moving around Mobility: Walking and Moving Around Current Status (E9528): At least 1 percent but less than 20 percent impaired, limited or restricted Mobility: Walking and Moving Around Goal Status [phone]): At least 1 percent but less than 20 percent impaired, limited or restricted Mobility: Walking and Moving Around Discharge Status [phone]): At least 1 percent  but less than 20 percent impaired, limited or restricted    Time: 1259-1342 PT Time Calculation (min) (ACUTE ONLY): 43 min   Charges:   PT Evaluation $PT Eval Low Complexity: 1 Procedure PT Treatments $Gait Training: 8-22 mins $Therapeutic Activity: 8-22 mins   PT G Codes:   PT G-Codes **NOT FOR INPATIENT CLASS** Functional Assessment Tool Used: clinical judgement Functional Limitation: Mobility: Walking and moving around Mobility: Walking and Moving Around Current Status (V2536): At least 1 percent but less than 20 percent impaired, limited or restricted Mobility: Walking and Moving Around Goal Status [phone]): At least 1 percent but less than 20 percent impaired, limited or restricted Mobility: Walking and Moving Around Discharge Status [phone]): At least 1 percent but less than 20 percent impaired, limited or restricted    Zena Amos 06/03/2016, 1:56 PM Pager [phone]

## 2016-06-03 NOTE — Care Management Note (Signed)
Case Management Note  Patient Details  Name: Jill FischerMargaret A Copland MRN: [id number] Date of Birth: 09/02/1933  Subjective/Objective:                    Action/Plan: Pt discharging back to her independent living [facility]. Daughter providing transportation. No further needs per CM.   Expected Discharge Date:                  Expected Discharge Plan:  Home/Self Care  In-House Referral:     Discharge planning Services     Post Acute Care Choice:    Choice offered to:     DME Arranged:    DME Agency:     HH Arranged:    HH Agency:     Status of Service:  Completed, signed off  If discussed at [employer] Length of Stay Meetings, dates discussed:    Additional Comments:  Kermit BaloKelli F Samon Dishner, RN 06/03/2016, 4:11 PM

## 2016-06-03 NOTE — H&P (Signed)
    INTERVAL PROCEDURE H&P  History and Physical Interval Note:  06/03/2016 8:42 AM  [name] has presented today for their planned procedure. The various methods of treatment have been discussed with the patient and family. After consideration of risks, benefits and other options for treatment, the patient has consented to the procedure.  The patients' outpatient history has been reviewed, patient examined, and no change in status from most recent office note within the past 30 days. I have reviewed the patients' chart and labs and will proceed as planned. Questions were answered to the patient's satisfaction.   Jill NoseKenneth C. Amed Datta, MD, [facility] Attending Cardiologist [facility]  Jill AbuKenneth C [facility] 06/03/2016, 8:42 AM

## 2016-06-06 ENCOUNTER — Encounter (HOSPITAL_COMMUNITY): Payer: Self-pay | Admitting: Internal Medicine

## 2016-06-09 ENCOUNTER — Ambulatory Visit: Payer: Medicare HMO | Admitting: Physician Assistant

## 2016-06-10 ENCOUNTER — Ambulatory Visit (INDEPENDENT_AMBULATORY_CARE_PROVIDER_SITE_OTHER): Payer: Medicare HMO | Admitting: Physician Assistant

## 2016-06-10 ENCOUNTER — Encounter: Payer: Self-pay | Admitting: Physician Assistant

## 2016-06-10 VITALS — BP 120/80 | HR 78 | Temp 99.3°F | Ht 58.25 in | Wt 114.0 lb

## 2016-06-10 DIAGNOSIS — Z87440 Personal history of urinary (tract) infections: Secondary | ICD-10-CM

## 2016-06-10 DIAGNOSIS — R413 Other amnesia: Secondary | ICD-10-CM | POA: Diagnosis not present

## 2016-06-10 DIAGNOSIS — Z8673 Personal history of transient ischemic attack (TIA), and cerebral infarction without residual deficits: Secondary | ICD-10-CM | POA: Diagnosis not present

## 2016-06-10 LAB — POCT URINALYSIS DIP (MANUAL ENTRY)
Bilirubin, UA: NEGATIVE
Blood, UA: NEGATIVE
Glucose, UA: NEGATIVE
Ketones, POC UA: NEGATIVE
Leukocytes, UA: NEGATIVE
Nitrite, UA: NEGATIVE
Protein Ur, POC: NEGATIVE
Spec Grav, UA: 1.015
Urobilinogen, UA: 0.2
pH, UA: 7.5

## 2016-06-10 LAB — POC MICROSCOPIC URINALYSIS (UMFC): Mucus: ABSENT

## 2016-06-10 NOTE — Progress Notes (Signed)
 [name]  MRN: [id number] DOB: 29-Mar-1934  PCP: Lucilla Edin, MD  Subjective:  Pt is an 82 year old female PMH of HTN, CAD, stroke who presents to clinic for follow-up TIA. She is here today with her daughter.  She presented to the Emergency Department on 11/22 and was diagnosed with TIA due to right MCA occlusion, likely chronic. MRI showed no acute infarct. MRA showed occluded flow of MCA and left PCA stenosis.  Her daughter states the providers in the emergency department told her that her mother's symptoms could have been caused by her UTI. She was treated with Keflex. Last dose is today. Patient states she has been tired since her visit to the emergency department, otherwise feeling well. Denies numbness, tingling, difficulty with speech or understanding, one-sided weakness, facial weakness.     LDL 151- Started Lipitor  Was on ASA 81mg  prior to admission - added Plavix for dual antiplatelet therapy for 3 months followed by Plavix alone.   Appt with [facility] for monitor next week.  Appt with Neurology Dr. Roda Shutters  Dec. 21.    Review of Systems  Constitutional: Positive for fatigue. Negative for chills and fever.  Respiratory: Negative for cough, shortness of breath and wheezing.   Cardiovascular: Negative for chest pain and palpitations.  Gastrointestinal: Negative for abdominal pain, diarrhea, nausea and vomiting.  Genitourinary: Negative for decreased urine volume, difficulty urinating, dysuria, enuresis, flank pain, frequency, hematuria and urgency.  Neurological: Positive for weakness. Negative for dizziness, light-headedness and headaches.  Psychiatric/Behavioral: Negative for agitation, behavioral problems and confusion.    Patient Active Problem List   Diagnosis Date Noted  . TIA (transient ischemic attack) 06/01/2016  . Hypokalemia 06/01/2016  . Infection of urinary tract 06/01/2016  . Occlusion of right middle cerebral artery   . Edema 02/17/2016  . CAD S/P  PCI 2013   . Hyperlipidemia 04/13/2012  . History of CVA (cerebrovascular accident) 02/08/2012  . Personal history of subdural hematoma 02/08/2012  . Essential hypertension   . Diverticulosis   . Osteoporosis     Current Outpatient Prescriptions on File Prior to Visit  Medication Sig Dispense Refill  . aspirin 81 MG tablet Take 81 mg by mouth daily.    Marland Kitchen atorvastatin (LIPITOR) 80 MG tablet Take 1 tablet (80 mg total) by mouth daily at 6 PM. 30 tablet 0  . carvedilol (COREG) 6.25 MG tablet Take 6.25 mg by mouth 2 (two) times daily with a meal.    . cetirizine (ZYRTEC) 10 MG tablet Take 10 mg by mouth daily as needed. Reported on 11/07/2015    . clopidogrel (PLAVIX) 75 MG tablet Take 1 tablet (75 mg total) by mouth daily. 30 tablet 0  . furosemide (LASIX) 20 MG tablet take 1 tablet by mouth once daily 30 tablet 0  . lisinopril (PRINIVIL,ZESTRIL) 5 MG tablet take 1 tablet by mouth once daily 90 tablet 0  . nitroGLYCERIN (NITROSTAT) 0.4 MG SL tablet place 1 tablet under the tongue every 5 minutes if needed for chest pain 25 tablet 6  . potassium chloride (MICRO-K) 10 MEQ CR capsule take 1 capsule by mouth once daily 30 capsule 0  . cephALEXin (KEFLEX) 250 MG capsule Take 1 capsule (250 mg total) by mouth every 12 (twelve) hours. For 3days for Urinary infection (Patient not taking: Reported on 06/10/2016) 6 capsule 0   No current facility-administered medications on file prior to visit.     Allergies  Allergen Reactions  .  Latex Itching  . Flagyl [Metronidazole] Rash     Objective:  BP 120/80 (BP Location: Left Arm, Patient Position: Sitting, Cuff Size: Normal)   Pulse 78   Temp 99.3 F (37.4 C) (Oral)   Ht 4' 10.25" (1.48 m)   Wt 114 lb (51.7 kg)   BMI 23.62 kg/m   Physical Exam  Constitutional: She is oriented to person, place, and time and well-developed, well-nourished, and in no distress. No distress.  Cardiovascular: Normal rate, regular rhythm and normal heart sounds.     Neurological: She is alert and oriented to person, place, and time. She has normal motor skills, normal sensation, normal strength, normal reflexes and intact cranial nerves. She shows no pronator drift. Gait normal. GCS score is 15.  Skin: Skin is warm and dry.  Psychiatric: Mood, memory, affect and judgment normal.  Vitals reviewed.   Assessment and Plan :  1. History of TIA (transient ischemic attack) 2. Memory difficulties - atorvastatin (LIPITOR) 10 MG tablet; Take 1 tablet (10 mg total) by mouth daily.  Dispense: 30 tablet; Refill: 3 - Ambulatory referral to Neurology - Patient is neurologically intact. Encouraged to continue taking medications as prescribed unless otherwise instructed by neurology.   3. History of UTI - POCT urinalysis dipstick - POCT Microscopic Urinalysis ([facility]) - Urine culture - Labs pending, will ensure resolution of UTI. Discussed signs/symptoms for returning to emergency department.   Marco Collie, PA-C  [facility] 06/10/2016 2:08 PM

## 2016-06-10 NOTE — Patient Instructions (Addendum)
Please take both Aspirin 81mg  and Plavix, continue taking Atorvastatin 80mg . Your neurologist will manage your medications at your appointment.  Please do not miss this appointment, I will contact you with the results of your lab work.  Please follow-up in one month for regular check-up and medication refill.     Heart-Healthy Eating Plan Introduction Heart-healthy meal planning includes:  Limiting unhealthy fats.  Increasing healthy fats.  Making other small dietary changes. You may need to talk with your doctor or a diet specialist (dietitian) to create an eating plan that is right for you. What types of fat should I choose?  Choose healthy fats. These include olive oil and canola oil, flaxseeds, walnuts, almonds, and seeds.  Eat more omega-3 fats. These include salmon, mackerel, sardines, tuna, flaxseed oil, and ground flaxseeds. Try to eat fish at least twice each week.  Limit saturated fats.  Saturated fats are often found in animal products, such as meats, butter, and cream.  Plant sources of saturated fats include palm oil, palm kernel oil, and coconut oil.  Avoid foods with partially hydrogenated oils in them. These include stick margarine, some tub margarines, cookies, crackers, and other baked goods. These contain trans fats. What general guidelines do I need to follow?  Check food labels carefully. Identify foods with trans fats or high amounts of saturated fat.  Fill one half of your plate with vegetables and green salads. Eat 4-5 servings of vegetables per day. A serving of vegetables is:  1 cup of raw leafy vegetables.   cup of raw or cooked cut-up vegetables.   cup of vegetable juice.  Fill one fourth of your plate with whole grains. Look for the word "whole" as the first word in the ingredient list.  Fill one fourth of your plate with lean protein foods.  Eat 4-5 servings of fruit per day. A serving of fruit is:  One medium whole fruit.   cup of  dried fruit.   cup of fresh, frozen, or canned fruit.   cup of 100% fruit juice.  Eat more foods that contain soluble fiber. These include apples, broccoli, carrots, beans, peas, and barley. Try to get 20-30 g of fiber per day.  Eat more home-cooked food. Eat less restaurant, buffet, and fast food.  Limit or avoid alcohol.  Limit foods high in starch and sugar.  Avoid fried foods.  Avoid frying your food. Try baking, boiling, grilling, or broiling it instead. You can also reduce fat by:  Removing the skin from poultry.  Removing all visible fats from meats.  Skimming the fat off of stews, soups, and gravies before serving them.  Steaming vegetables in water or broth.  Lose weight if you are overweight.  Eat 4-5 servings of nuts, legumes, and seeds per week:  One serving of dried beans or legumes equals  cup after being cooked.  One serving of nuts equals 1 ounces.  One serving of seeds equals  ounce or one tablespoon.  You may need to keep track of how much salt or sodium you eat. This is especially true if you have high blood pressure. Talk with your doctor or dietitian to get more information. What foods can I eat? Grains  Breads, including [place], white, pita, wheat, raisin, rye, oatmeal, and [place]. Tortillas that are neither fried nor made with lard or trans fat. Low-fat rolls, including hotdog and hamburger buns and English muffins. Biscuits. Muffins. Waffles. Pancakes. Light popcorn. Whole-grain cereals. Flatbread. Melba toast. Pretzels. Breadsticks. Rusks. Low-fat  snacks. Low-fat crackers, including oyster, saltine, matzo, graham, animal, and rye. Rice and pasta, including brown rice and pastas that are made with whole wheat. Vegetables  All vegetables. Fruits  All fruits, but limit coconut. Meats and Other Protein Sources  Lean, well-trimmed beef, veal, pork, and lamb. Chicken and [place] without skin. All fish and shellfish. Wild duck, rabbit, pheasant, and  venison. Egg whites or low-cholesterol egg substitutes. Dried beans, peas, lentils, and tofu. Seeds and most nuts. Dairy  Low-fat or nonfat cheeses, including ricotta, string, and mozzarella. Skim or 1% milk that is liquid, powdered, or evaporated. Buttermilk that is made with low-fat milk. Nonfat or low-fat yogurt. Beverages  Mineral water. Diet carbonated beverages. Sweets and Desserts  Sherbets and fruit ices. Honey, jam, marmalade, jelly, and syrups. Meringues and gelatins. Pure sugar candy, such as hard candy, jelly beans, gumdrops, mints, marshmallows, and small amounts of dark chocolate. [employer]. Eat all sweets and desserts in moderation. Fats and Oils  Nonhydrogenated (trans-free) margarines. Vegetable oils, including soybean, sesame, sunflower, olive, peanut, safflower, corn, canola, and cottonseed. Salad dressings or mayonnaise made with a vegetable oil. Limit added fats and oils that you use for cooking, baking, salads, and as spreads. Other  Cocoa powder. Coffee and tea. All seasonings and condiments. The items listed above may not be a complete list of recommended foods or beverages. Contact your dietitian for more options.   What foods are not recommended? Grains  Breads that are made with saturated or trans fats, oils, or whole milk. Croissants. Butter rolls. Cheese breads. Sweet rolls. Donuts. Buttered popcorn. Chow mein noodles. High-fat crackers, such as cheese or butter crackers. Meats and Other Protein Sources  Fatty meats, such as hotdogs, short ribs, sausage, spareribs, bacon, rib eye roast or steak, and mutton. High-fat deli meats, such as salami and bologna. Caviar. Domestic duck and goose. Organ meats, such as kidney, liver, sweetbreads, and heart. Dairy  Cream, sour cream, cream cheese, and creamed cottage cheese. Whole-milk cheeses, including blue (bleu), [address], [place], [place], [address], [place], [address], cheddar, [place], and [place]. Whole or 2% milk  that is liquid, evaporated, or condensed. Whole buttermilk. Cream sauce or high-fat cheese sauce. Yogurt that is made from whole milk. Beverages  Regular sodas and juice drinks with added sugar. Sweets and Desserts  Frosting. Pudding. Cookies. Cakes other than angel food cake. Candy that has milk chocolate or white chocolate, hydrogenated fat, butter, coconut, or unknown ingredients. Buttered syrups. Full-fat ice cream or ice cream drinks. Fats and Oils  Gravy that has suet, meat fat, or shortening. Cocoa butter, hydrogenated oils, palm oil, coconut oil, palm kernel oil. These can often be found in baked products, candy, fried foods, nondairy creamers, and whipped toppings. Solid fats and shortenings, including bacon fat, salt pork, lard, and butter. Nondairy cream substitutes, such as coffee creamers and sour cream substitutes. Salad dressings that are made of unknown oils, cheese, or sour cream. The items listed above may not be a complete list of foods and beverages to avoid. Contact your dietitian for more information.  This information is not intended to replace advice given to you by your health care provider. Make sure you discuss any questions you have with your health care provider. Document Released: 12/27/2011 Document Revised: 12/03/2015 Document Reviewed: 12/19/2013  2017 Elsevier   IF you received an x-ray today, you will receive an invoice from [facility]. Please contact [facility] at [phone] with questions or concerns regarding your invoice.   IF you  received labwork today, you will receive an invoice from [facility]. Please contact [facility] at [phone] with questions or concerns regarding your invoice.   Our billing staff will not be able to assist you with questions regarding bills from these companies.  You will be contacted with the lab results as soon as they are available. The fastest way to get your results is to  activate your My Chart account. Instructions are located on the last page of this paperwork. If you have not heard from us regarding the results in 2 weeks, please contact this office.

## 2016-06-12 LAB — URINE CULTURE: Organism ID, Bacteria: NO GROWTH

## 2016-06-15 NOTE — Progress Notes (Signed)
Please call patient and let her know her urine culture is negative. Thank you!

## 2016-06-16 ENCOUNTER — Other Ambulatory Visit: Payer: Self-pay | Admitting: Physician Assistant

## 2016-06-16 ENCOUNTER — Ambulatory Visit (INDEPENDENT_AMBULATORY_CARE_PROVIDER_SITE_OTHER): Payer: Medicare HMO

## 2016-06-16 DIAGNOSIS — I4891 Unspecified atrial fibrillation: Secondary | ICD-10-CM

## 2016-06-16 DIAGNOSIS — I639 Cerebral infarction, unspecified: Secondary | ICD-10-CM

## 2016-06-21 ENCOUNTER — Ambulatory Visit: Payer: Medicare HMO | Admitting: Neurology

## 2016-06-27 ENCOUNTER — Other Ambulatory Visit: Payer: Self-pay | Admitting: Emergency Medicine

## 2016-06-29 NOTE — Telephone Encounter (Signed)
06/2016 last ov 05/2016 last lab

## 2016-07-15 ENCOUNTER — Other Ambulatory Visit: Payer: Self-pay | Admitting: Family Medicine

## 2016-07-18 ENCOUNTER — Ambulatory Visit (INDEPENDENT_AMBULATORY_CARE_PROVIDER_SITE_OTHER): Payer: Medicare HMO | Admitting: Emergency Medicine

## 2016-07-18 VITALS — BP 142/80 | HR 69 | Temp 98.2°F | Resp 16 | Ht 59.0 in | Wt 122.0 lb

## 2016-07-18 DIAGNOSIS — R350 Frequency of micturition: Secondary | ICD-10-CM

## 2016-07-18 DIAGNOSIS — Z23 Encounter for immunization: Secondary | ICD-10-CM | POA: Diagnosis not present

## 2016-07-18 DIAGNOSIS — R600 Localized edema: Secondary | ICD-10-CM | POA: Diagnosis not present

## 2016-07-18 DIAGNOSIS — I10 Essential (primary) hypertension: Secondary | ICD-10-CM | POA: Diagnosis not present

## 2016-07-18 LAB — POCT URINALYSIS DIP (MANUAL ENTRY)
Bilirubin, UA: NEGATIVE
Blood, UA: NEGATIVE
Glucose, UA: NEGATIVE
Ketones, POC UA: NEGATIVE
NITRITE UA: NEGATIVE
Protein Ur, POC: NEGATIVE
Spec Grav, UA: 1.01
UROBILINOGEN UA: 0.2
pH, UA: 7.5

## 2016-07-18 MED ORDER — CEFADROXIL 500 MG PO CAPS: 500.0000 mg | ORAL_CAPSULE | Freq: Two times a day (BID) | ORAL | 0 refills | Status: AC

## 2016-07-18 NOTE — Progress Notes (Signed)
[name] 83 y.o.   Chief Complaint  Patient presents with  . Foot Swelling    X 2 weeks  . bladder problem    pt. cant hold urine  . Immunizations    flu    HISTORY OF PRESENT ILLNESS: This is a 83 y.o. female complaining of chronic urinary incontinence; recently had a UTI and was in the Hospital last November; also has chronic lower leg edema; also needs flu shot. No other significant symptoms.   HPI   Prior to Admission medications   Medication Sig Start Date End Date Taking? Authorizing Provider  aspirin 81 MG tablet Take 81 mg by mouth daily.   Yes Historical Provider, MD  atorvastatin (LIPITOR) 80 MG tablet Take 1 tablet (80 mg total) by mouth daily at 6 PM. 06/03/16  Yes Zannie Cove, MD  carvedilol (COREG) 6.25 MG tablet Take 6.25 mg by mouth 2 (two) times daily with a meal.   Yes Historical Provider, MD  carvedilol (COREG) 6.25 MG tablet take 1 tablet by mouth twice a day 06/29/16  Yes Madelaine Bhat McVey, PA-C  cetirizine (ZYRTEC) 10 MG tablet Take 10 mg by mouth daily as needed. Reported on 11/07/2015   Yes Historical Provider, MD  furosemide (LASIX) 20 MG tablet take 1 tablet by mouth once daily 03/30/16  Yes Collene Gobble, MD  lisinopril (PRINIVIL,ZESTRIL) 5 MG tablet take 1 tablet by mouth once daily 03/30/16  Yes Collene Gobble, MD  nitroGLYCERIN (NITROSTAT) 0.4 MG SL tablet place 1 tablet under the tongue every 5 minutes if needed for chest pain 10/26/15  Yes Peter M Sw[place], MD  potassium chloride (MICRO-K) 10 MEQ CR capsule take 1 capsule by mouth once daily 07/16/16  Yes Ethelda Chick, MD  cefadroxil (DURICEF) 500 MG capsule Take 1 capsule (500 mg total) by mouth 2 (two) times daily. 07/18/16 07/21/16  Georgina Quint, MD    Allergies  Allergen Reactions  . Latex Itching  . Flagyl [Metronidazole] Rash    Patient Active Problem List   Diagnosis Date Noted  . TIA (transient ischemic attack) 06/01/2016  . Hypokalemia 06/01/2016  . Infection of  urinary tract 06/01/2016  . Occlusion of right middle cerebral artery   . Edema 02/17/2016  . CAD S/P PCI 2013   . Hyperlipidemia 04/13/2012  . History of CVA (cerebrovascular accident) 02/08/2012  . Personal history of subdural hematoma 02/08/2012  . Essential hypertension   . Diverticulosis   . Osteoporosis     Past Medical History:  Diagnosis Date  . Abnormal CT of the abdomen    Nodes  resolved  . Arthritis   . Coronary artery disease    a. [place] s/p BMS to prox LAD (with residual moderate mid-LAD & mid-RCA dz for medical therapy) 01/2012 - EF 40% at that time by cath.  . CVA (cerebral vascular accident) (HCC)    Right brain CVA 04/2011 - carotid dopplers showed no significant stenosis (tortuous ICA).   . Diverticulosis   . Hypertension   . Inguinal hernia    Right, seen on CT 2008  . LV dysfunction    EF 40% by cath 01/2012 with WMA, but 50% by f/u echo same admission  . Osteoporosis   . Pyelonephritis 2006  . Subdural hematoma (HCC)    2004 s/p craniotomy    Past Surgical History:  Procedure Laterality Date  . CARDIAC CATHETERIZATION  02/07/2012  . CESAREAN SECTION     X 2  .  CORONARY STENT PLACEMENT  02/07/2012   LAD  . CRANIOTOMY  2004  . FLEXIBLE SIGMOIDOSCOPY  11/24/2004   Dr. Mann--Diverticulosis   . LEFT HEART CATHETERIZATION WITH CORONARY ANGIOGRAM N/A 02/07/2012   Procedure: LEFT HEART CATHETERIZATION WITH CORONARY ANGIOGRAM;  Surgeon: Peter M Swaziland, MD;  Location: [facility] CATH LAB;  Service: Cardiovascular;  Laterality: N/A;  . PERCUTANEOUS CORONARY STENT INTERVENTION (PCI-S)  02/07/2012   Procedure: PERCUTANEOUS CORONARY STENT INTERVENTION (PCI-S);  Surgeon: Peter M Swaziland, MD;  Location: [facility] CATH LAB;  Service: Cardiovascular;;  . TEE WITHOUT CARDIOVERSION N/A 06/03/2016   Procedure: TRANSESOPHAGEAL ECHOCARDIOGRAM (TEE);  Surgeon: [name], MD;  Location: [facility] ENDOSCOPY;  Service: Cardiovascular;  Laterality: N/A;  . TONSILLECTOMY AND ADENOIDECTOMY    .  TOTAL ABDOMINAL HYSTERECTOMY  1985   FIBROID    Social History   Social History  . Marital status: Legally Separated    Spouse name: N/A  . Number of children: N/A  . Years of education: N/A   Occupational History  . Not on file.   Social History Main Topics  . Smoking status: Former Smoker    Types: Cigarettes    Quit date: 04/10/2003  . Smokeless tobacco: Never Used     Comment: quit 2004  . Alcohol use Yes     Comment: occasional  . Drug use: No  . Sexual activity: Not Currently    Birth control/ protection: Post-menopausal   Other Topics Concern  . 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   2 grown children, a daughter who lives in [place] area and a son who lives with the patient.    Family History  Problem Relation Age of Onset  . Stroke Father      Review of Systems  Constitutional: Negative for chills, fever and weight loss.  HENT: Negative for ear pain and nosebleeds.   Eyes: Negative for double vision and pain.  Respiratory: Negative for cough and shortness of breath.   Cardiovascular: Positive for leg swelling. Negative for chest pain and palpitations.  Gastrointestinal: Negative for abdominal pain, nausea and vomiting.  Genitourinary: Positive for frequency. Negative for dysuria, flank pain and hematuria.  Musculoskeletal: Negative for joint pain and myalgias.  Skin: Negative for rash.  Neurological: Positive for weakness. Negative for dizziness and headaches.  Endo/Heme/Allergies: Does not bruise/bleed easily.  All other systems reviewed and are negative.  Vitals:   07/18/16 1432  BP: (!) 142/80  Pulse: 69  Resp: 16  Temp: 98.2 F (36.8 C)     Physical Exam  Constitutional: She is oriented to person, place, and time. She appears well-developed and well-nourished.  HENT:  Head: Normocephalic and atraumatic.  Eyes: Conjunctivae and EOM are  normal. Pupils are equal, round, and reactive to light.  Neck: Normal range of motion. Neck supple.  Cardiovascular: Normal rate and regular rhythm.   Pulmonary/Chest: Effort normal and breath sounds normal.  Abdominal: She exhibits no distension. There is no tenderness.  Musculoskeletal: Normal range of motion.  LE: bilateral pitting edema +1-2  Neurological: She is alert and oriented to person, place, and time.  Skin: Skin is warm and dry. Capillary refill takes less than 2 seconds.  Psychiatric: She has a normal mood and affect. Her behavior is normal.  Vitals reviewed.    ASSESSMENT & PLAN: Ardelia was seen today for foot swelling, bladder problem  and immunizations.  Diagnoses and all orders for this visit:  Urinary frequency -     POCT urinalysis dipstick -     Ambulatory referral to Urology  Needs flu shot -     Flu Vaccine QUAD 36+ mos IM  Lower leg edema  Other orders -     cefadroxil (DURICEF) 500 MG capsule; Take 1 capsule (500 mg total) by mouth 2 (two) times daily.    Patient Instructions       IF you received an x-ray today, you will receive an invoice from [facility]. Please contact [facility] at [phone] with questions or concerns regarding your invoice.   IF you received labwork today, you will receive an invoice from [place]. Please contact [facility] at [phone] with questions or concerns regarding your invoice.   Our billing staff will not be able to assist you with questions regarding bills from these companies.  You will be contacted with the lab results as soon as they are available. The fastest way to get your results is to activate your My Chart account. Instructions are located on the last page of this paperwork. If you have not heard from [place] regarding the results in 2 weeks, please contact this office.      Urinary Frequency The number of times a normal person urinates depends upon how much liquid they take in  and how much liquid they are losing. If the temperature is hot and there is high humidity, then the person will sweat more and usually breathe a little more frequently. These factors decrease the amount of frequency of urination that would be considered normal. The amount you drink is easily determined, but the amount of fluid lost is sometimes more difficult to calculate.  Fluid is lost in two ways:  Sensible fluid loss is usually measured by the amount of urine that you get rid of. Losses of fluid can also occur with diarrhea.  Insensible fluid loss is more difficult to measure. It is caused by evaporation. Insensible loss of fluid occurs through breathing and sweating. It usually ranges from a little less than a quart to a little more than a quart of fluid a day. In normal temperatures and activity levels, the average person may urinate 4 to 7 times in a 24-hour period. Needing to urinate more often than that could indicate a problem. If one urinates 4 to 7 times in 24 hours and has large volumes each time, that could indicate a different problem from one who urinates 4 to 7 times a day and has small volumes. The time of urinating is also important. Most urinating should be done during the waking hours. Getting up at night to urinate frequently can indicate some problems. CAUSES  The bladder is the organ in your lower abdomen that holds urine. Like a balloon, it swells some as it fills up. Your nerves sense this and tell you it is time to head for the bathroom. There are a number of reasons that you might feel the need to urinate more often than usual. They include:  Urinary tract infection. This is usually associated with other signs such as burning when you urinate.  In men, problems with the prostate (a walnut-size gland that is located near the tube that carries urine out of your body). There are two reasons why the prostate can cause an increased frequency of urination:  An enlarged prostate  that does not let the bladder empty well. If the bladder only half  empties when you urinate, then it only has half the capacity to fill before you have to urinate again.  The nerves in the bladder become more hypersensitive with an increased size of the prostate even if the bladder empties completely.  Pregnancy.  Obesity. Excess weight is more likely to cause a problem for women than for men.  Bladder stones or other bladder problems.  Caffeine.  Alcohol.  Medications. For example, drugs that help the body get rid of extra fluid (diuretics) increase urine production. Some other medicines must be taken with lots of fluids.  Muscle or nerve weakness. This might be the result of a spinal cord injury, a stroke, multiple sclerosis, or Parkinson disease.  Long-standing diabetes can decrease the sensation of the bladder. This loss of sensation makes it harder to sense the bladder needs to be emptied. Over a period of years, the bladder is stretched out by constant overfilling. This weakens the bladder muscles so that the bladder does not empty well and has less capacity to fill with new urine.  Interstitial cystitis (also called painful bladder syndrome). This condition develops because the tissues that line the inside of the bladder are inflamed (inflammation is the body's way of reacting to injury or infection). It causes pain and frequent urination. It occurs in women more often than in men. DIAGNOSIS   To decide what might be causing your urinary frequency, your health care provider will probably:  Ask about symptoms you have noticed.  Ask about your overall health. This will include questions about any medications you are taking.  Do a physical examination.  Order some tests. These might include:  A blood test to check for diabetes or other health issues that could be contributing to the problem.  Urine testing. This could measure the flow of urine and the pressure on the  bladder.  A test of your neurological system (the brain, spinal cord, and nerves). This is the system that senses the need to urinate.  A bladder test to check whether it is emptying completely when you urinate.  Cystoscopy. This test uses a thin tube with a tiny camera on it. It offers a look inside your urethra and bladder to see if there are problems.  Imaging tests. You might be given a contrast dye and then asked to urinate. X-rays are taken to see how your bladder is working. TREATMENT  It is important for you to be evaluated to determine if the amount or frequency that you have is unusual or abnormal. If it is found to be abnormal, the cause should be determined and this can usually be found out easily. Depending upon the cause, treatment could include medication, stimulation of the nerves, or surgery. There are not too many things that you can do as an individual to change your urinary frequency. It is important that you balance the amount of fluid intake needed to compensate for your activity and the temperature. Medical problems will be diagnosed and taken care of by your physician. There is no particular bladder training such as Kegel exercises that you can do to help urinary frequency. This is an exercise that is usually recommended for people who have leaking of urine when they laugh, cough, or sneeze. HOME CARE INSTRUCTIONS   Take any medications your health care provider prescribed or suggested. Follow the directions carefully.  Practice any lifestyle changes that are recommended. These might include:  Drinking less fluid or drinking at different times of the day. If you  need to urinate often during the night, for example, you may need to stop drinking fluids early in the evening.  Cutting down on caffeine or alcohol. They both can make you need to urinate more often than normal. Caffeine is found in coffee, tea, and sodas.  Losing weight, if that is recommended.  Keep a  journal or a log. You might be asked to record how much you drink and when and where you feel the need to urinate. This will also help evaluate how well the treatment provided by your physician is working. SEEK MEDICAL CARE IF:   Your need to urinate often gets worse.  You feel increased pain or irritation when you urinate.  You notice blood in your urine.  You have questions about any medications that your health care provider recommended.  You notice blood, pus, or swelling at the site of any test or treatment procedure.  You develop a fever of more than 100.762F (38.1C). SEEK IMMEDIATE MEDICAL CARE IF:  You develop a fever of more than 102.62F (38.9C). This information is not intended to replace advice given to you by your health care provider. Make sure you discuss any questions you have with your health care provider. Document Released: 04/23/2009 Document Revised: 07/18/2014 Document Reviewed: 01/21/2015 Elsevier Interactive Patient Education  2017 Elsevier Inc.      Edwina Barth, MD [facility]

## 2016-07-18 NOTE — Patient Instructions (Addendum)
   IF you received an x-ray today, you will receive an invoice from [facility]. Please contact [facility] at [phone] with questions or concerns regarding your invoice.   IF you received labwork today, you will receive an invoice from [facility]. Please contact [facility] at [phone] with questions or concerns regarding your invoice.   Our billing staff will not be able to assist you with questions regarding bills from these companies.  You will be contacted with the lab results as soon as they are available. The fastest way to get your results is to activate your My Chart account. Instructions are located on the last page of this paperwork. If you have not heard from us regarding the results in 2 weeks, please contact this office.     Urinary Frequency The number of times a normal person urinates depends upon how much liquid they take in and how much liquid they are losing. If the temperature is hot and there is high humidity, then the person will sweat more and usually breathe a little more frequently. These factors decrease the amount of frequency of urination that would be considered normal. The amount you drink is easily determined, but the amount of fluid lost is sometimes more difficult to calculate.  Fluid is lost in two ways:  Sensible fluid loss is usually measured by the amount of urine that you get rid of. Losses of fluid can also occur with diarrhea.  Insensible fluid loss is more difficult to measure. It is caused by evaporation. Insensible loss of fluid occurs through breathing and sweating. It usually ranges from a little less than a quart to a little more than a quart of fluid a day. In normal temperatures and activity levels, the average person may urinate 4 to 7 times in a 24-hour period. Needing to urinate more often than that could indicate a problem. If one urinates 4 to 7 times in 24 hours and has large volumes each time, that could indicate a  different problem from one who urinates 4 to 7 times a day and has small volumes. The time of urinating is also important. Most urinating should be done during the waking hours. Getting up at night to urinate frequently can indicate some problems. CAUSES  The bladder is the organ in your lower abdomen that holds urine. Like a balloon, it swells some as it fills up. Your nerves sense this and tell you it is time to head for the bathroom. There are a number of reasons that you might feel the need to urinate more often than usual. They include:  Urinary tract infection. This is usually associated with other signs such as burning when you urinate.  In men, problems with the prostate (a walnut-size gland that is located near the tube that carries urine out of your body). There are two reasons why the prostate can cause an increased frequency of urination:  An enlarged prostate that does not let the bladder empty well. If the bladder only half empties when you urinate, then it only has half the capacity to fill before you have to urinate again.  The nerves in the bladder become more hypersensitive with an increased size of the prostate even if the bladder empties completely.  Pregnancy.  Obesity. Excess weight is more likely to cause a problem for women than for men.  Bladder stones or other bladder problems.  Caffeine.  Alcohol.  Medications. For example, drugs that help the body get rid of extra fluid (  diuretics) increase urine production. Some other medicines must be taken with lots of fluids.  Muscle or nerve weakness. This might be the result of a spinal cord injury, a stroke, multiple sclerosis, or Parkinson disease.  Long-standing diabetes can decrease the sensation of the bladder. This loss of sensation makes it harder to sense the bladder needs to be emptied. Over a period of years, the bladder is stretched out by constant overfilling. This weakens the bladder muscles so that the bladder  does not empty well and has less capacity to fill with new urine.  Interstitial cystitis (also called painful bladder syndrome). This condition develops because the tissues that line the inside of the bladder are inflamed (inflammation is the body's way of reacting to injury or infection). It causes pain and frequent urination. It occurs in women more often than in men. DIAGNOSIS   To decide what might be causing your urinary frequency, your health care provider will probably:  Ask about symptoms you have noticed.  Ask about your overall health. This will include questions about any medications you are taking.  Do a physical examination.  Order some tests. These might include:  A blood test to check for diabetes or other health issues that could be contributing to the problem.  Urine testing. This could measure the flow of urine and the pressure on the bladder.  A test of your neurological system (the brain, spinal cord, and nerves). This is the system that senses the need to urinate.  A bladder test to check whether it is emptying completely when you urinate.  Cystoscopy. This test uses a thin tube with a tiny camera on it. It offers a look inside your urethra and bladder to see if there are problems.  Imaging tests. You might be given a contrast dye and then asked to urinate. X-rays are taken to see how your bladder is working. TREATMENT  It is important for you to be evaluated to determine if the amount or frequency that you have is unusual or abnormal. If it is found to be abnormal, the cause should be determined and this can usually be found out easily. Depending upon the cause, treatment could include medication, stimulation of the nerves, or surgery. There are not too many things that you can do as an individual to change your urinary frequency. It is important that you balance the amount of fluid intake needed to compensate for your activity and the temperature. Medical problems  will be diagnosed and taken care of by your physician. There is no particular bladder training such as Kegel exercises that you can do to help urinary frequency. This is an exercise that is usually recommended for people who have leaking of urine when they laugh, cough, or sneeze. HOME CARE INSTRUCTIONS   Take any medications your health care provider prescribed or suggested. Follow the directions carefully.  Practice any lifestyle changes that are recommended. These might include:  Drinking less fluid or drinking at different times of the day. If you need to urinate often during the night, for example, you may need to stop drinking fluids early in the evening.  Cutting down on caffeine or alcohol. They both can make you need to urinate more often than normal. Caffeine is found in coffee, tea, and sodas.  Losing weight, if that is recommended.  Keep a journal or a log. You might be asked to record how much you drink and when and where you feel the need to urinate. This will   also help evaluate how well the treatment provided by your physician is working. SEEK MEDICAL CARE IF:   Your need to urinate often gets worse.  You feel increased pain or irritation when you urinate.  You notice blood in your urine.  You have questions about any medications that your health care provider recommended.  You notice blood, pus, or swelling at the site of any test or treatment procedure.  You develop a fever of more than 100.5F (38.1C). SEEK IMMEDIATE MEDICAL CARE IF:  You develop a fever of more than 102.0F (38.9C). This information is not intended to replace advice given to you by your health care provider. Make sure you discuss any questions you have with your health care provider. Document Released: 04/23/2009 Document Revised: 07/18/2014 Document Reviewed: 01/21/2015 Elsevier Interactive Patient Education  2017 Elsevier Inc.   

## 2016-07-20 ENCOUNTER — Ambulatory Visit: Payer: Medicare HMO | Admitting: Neurology

## 2016-07-20 ENCOUNTER — Telehealth: Payer: Self-pay | Admitting: *Deleted

## 2016-07-20 NOTE — Telephone Encounter (Signed)
no showed new patient appt 

## 2016-07-21 ENCOUNTER — Telehealth: Payer: Self-pay

## 2016-07-21 ENCOUNTER — Encounter: Payer: Self-pay | Admitting: Neurology

## 2016-07-21 NOTE — Telephone Encounter (Signed)
RN call patients daughter [name] about her moms cardiac monitor. Rn stated the test was negative for atrial fibrillation. COtinue treatment plan.Pts daughter verbalized understanding.

## 2016-07-21 NOTE — Telephone Encounter (Signed)
-----   Message from Marvel PlanJindong Xu, MD sent at 07/19/2016  6:19 PM EST ----- Could you please let the patient know that the cardiac event monitoring test done recently was negative for afb. Please continue current treatment. Thanks.  Marvel PlanJindong Xu, MD PhD Stroke Neurology 07/19/2016 6:19 PM

## 2016-07-21 NOTE — Telephone Encounter (Signed)
Rn call patents daughter about her moms cardiac event results. Rn gave her the results. While on the phone the daughter wanted to r/s with Dr. Lucia GaskinsAhern for stroke hospital follow up. Rn stated pt has an appt with Dr. Karel JarvisAquino per her appts in the computer for memory on 08/24/2016. Rn stated Dr. Lucia GaskinsAhern is book till March 2018. Rn stated her stroke and memory can be evaluated on 08/24/2016 at Dr. Karel JarvisAquino office. Rn also stated per Dr. Lucia GaskinsAhern if she wants her [facility]  [facility] stroke an [facility]t can be made  Rn stated its good  Rn also stated its better to have one neurologist for the continuity of the care. Rn stated to daughter she [facility]. Pts daughter verbalized understanding.

## 2016-07-29 ENCOUNTER — Telehealth: Payer: Self-pay

## 2016-07-29 MED ORDER — ATORVASTATIN CALCIUM 80 MG PO TABS: 80.0000 mg | ORAL_TABLET | Freq: Every day | ORAL | 2 refills | Status: DC

## 2016-07-29 NOTE — Telephone Encounter (Signed)
Spoke to patient's daughter Kenney Housemananya she wanted to know if mother needed to continue taking Plavix and Lipitor.Advised to continue.I will send message to Dr.Jordan for advice.Stated her [place].She wanted to ask Dr.Jordan if he would refer her to a cardiologist there.Message sent to Dr.Jordan.

## 2016-08-01 NOTE — Telephone Encounter (Signed)
She should continue Plavix and lipitor. No ASA. I know Dr. Patrick [place] Trask in [place]. He would be a good choice.  Zuma Hust SwazilandJordan MD, [facility]

## 2016-08-02 ENCOUNTER — Other Ambulatory Visit: Payer: Self-pay

## 2016-08-02 MED ORDER — CLOPIDOGREL BISULFATE 75 MG PO TABS: 75.0000 mg | ORAL_TABLET | Freq: Every day | ORAL | 6 refills | Status: DC

## 2016-08-02 NOTE — Telephone Encounter (Signed)
Spoke to patient's daughter Kenney Housemananya Dr.Jordan's recommendations given.Advised I will call Dr.Joe Trask's office and schedule appointment.

## 2016-08-05 ENCOUNTER — Other Ambulatory Visit: Payer: Self-pay | Admitting: Cardiology

## 2016-08-05 NOTE — Telephone Encounter (Signed)
Rx(s) sent to pharmacy electronically.  

## 2016-08-09 ENCOUNTER — Ambulatory Visit: Payer: Medicare HMO | Admitting: Neurology

## 2016-08-17 NOTE — Telephone Encounter (Signed)
Spoke to Glen LyonJenelle at  Dr.Trask's office about a appointment.Patient's last office note and demographics faxed to her at fax # [phone].She will contact daughter Kenney Housemananya about scheduling appointment.

## 2016-08-24 ENCOUNTER — Encounter: Payer: Self-pay | Admitting: Neurology

## 2016-08-24 ENCOUNTER — Ambulatory Visit (INDEPENDENT_AMBULATORY_CARE_PROVIDER_SITE_OTHER): Payer: Medicare HMO | Admitting: Neurology

## 2016-08-24 VITALS — BP 138/70 | HR 70 | Ht 59.0 in | Wt 121.2 lb

## 2016-08-24 DIAGNOSIS — R69 Illness, unspecified: Secondary | ICD-10-CM | POA: Diagnosis not present

## 2016-08-24 DIAGNOSIS — F03A0 Unspecified dementia, mild, without behavioral disturbance, psychotic disturbance, mood disturbance, and anxiety: Secondary | ICD-10-CM

## 2016-08-24 DIAGNOSIS — G451 Carotid artery syndrome (hemispheric): Secondary | ICD-10-CM | POA: Diagnosis not present

## 2016-08-24 DIAGNOSIS — F0390 Unspecified dementia without behavioral disturbance: Secondary | ICD-10-CM

## 2016-08-24 MED ORDER — DONEPEZIL HCL 10 MG PO TABS: ORAL_TABLET | ORAL | 11 refills | Status: DC

## 2016-08-24 NOTE — Progress Notes (Signed)
NEUROLOGY CONSULTATION NOTE  [name] MRN: [id number] DOB: Jan 09, 1934  Referring provider: Hulen Shouts, PA-C Primary care provider: Dr. Lesle Chris  Reason for consult:  Memory loss  Thank you for your kind referral of [name] for consultation of the above symptoms. Although her history is well known to you, please allow me to reiterate it for the purpose of our medical record. The patient was accompanied to the clinic by her daughter who also provides collateral information. Records and images were personally reviewed where available.  HISTORY OF PRESENT ILLNESS: This is a pleasant 83 year old right-handed woman with a history of hypertension, hyperlipidemia, strokes in 2004 and 2012, chronic subdural hematoma s/p evacuation, presenting for evaluation of worsening memory. She feels her memory is okay. She states she drives and denies missing her medications. Her daughter started noticing memory changes a year ago. She was living in an independent living facility until she was admitted to [facility]/01/16 for a TIA. Since then, she has been living with her daughter. Her daughter reports that she stopped driving many years ago. She is able to take her medications but takes a long time because she keeps reading and re-reading the medication bottles. Her daughter is in charge of her medications now. She would not recall what she previously did, bathing and dressing is a long process because she goes back and forth into the bathroom to get all the things she needs. She had been working at [employer] until 4 years ago. Her daughter has been in charge of bill payments for the past 2 years because she was missing bill payments. Her daughter denies any personality changes, paranoia, or hallucinations. She denies any family history of dementia. No history of significant head injuries, she rarely drinks alcohol.   She had a right frontotemporal stroke and chronic subdural hematoma that was  evacuated in 2004. She had a right internal capsule infarct in 2012 with residual mild left-sided weakness.  She denies any headaches, dizziness, diplopia, dysarthria, dysphagia, neck/back pain, focal numbness/tingling, bowel/bladder dysfunction. No anosmia, tremors, no falls. She was admitted to [facility] last November 2017 after she was found slumped on the toilet with slurred speech and facial droop, unresponsive for 20 minutes. There was no acute stroke on MRI. There was slow/occluded flow in the right MCA from the ICA terminus, extensive intracranial atherosclerotic disease, slight progression of multiple remote lacunar infarcts and diffuse white matter changes. She had an EEG which showed occasional slowing over the right frontocentral region, breach artifact in this region, no epileptiform discharges seen. She had a TEE which showed EF of 55-60%, no LAA thrombus, small intermittent PFO with right to left flow after valsalva. She had an event monitor for a month with isolated PVCs and PACs, no atrial fibrillation. She was seen by stroke neurologist Dr. Roda Shutters, recommendation was to continue DAPT for 3 months, then switch to Plavix alone. She is now on Plavix monotherapy. Long-term BP goal of 130-150 due to right MCA occlusion. There was no orthostatic hypotension in the hospital. She was started on Lipitor 80mg  daily for LDL of 151, goal <70.   PAST MEDICAL HISTORY: Past Medical History:  Diagnosis Date  . Abnormal CT of the abdomen    Nodes  resolved  . Arthritis   . Coronary artery disease    a. [place] s/p BMS to prox LAD (with residual moderate mid-LAD & mid-RCA dz for medical therapy) 01/2012 - EF 40% at that time by cath.  Marland Kitchen  CVA (cerebral vascular accident) (HCC)    Right brain CVA 04/2011 - carotid dopplers showed no significant stenosis (tortuous ICA).   . Diverticulosis   . Hypertension   . Inguinal hernia    Right, seen on CT 2008  . LV dysfunction    EF 40% by cath 01/2012 with WMA, but 50% by f/u  echo same admission  . Osteoporosis   . Pyelonephritis 2006  . Subdural hematoma (HCC)    2004 s/p craniotomy    PAST SURGICAL HISTORY: Past Surgical History:  Procedure Laterality Date  . CARDIAC CATHETERIZATION  02/07/2012  . CESAREAN SECTION     X 2  . CORONARY STENT PLACEMENT  02/07/2012   LAD  . CRANIOTOMY  2004  . FLEXIBLE SIGMOIDOSCOPY  11/24/2004   Dr. Mann--Diverticulosis   . LEFT HEART CATHETERIZATION WITH CORONARY ANGIOGRAM N/A 02/07/2012   Procedure: LEFT HEART CATHETERIZATION WITH CORONARY ANGIOGRAM;  Surgeon: Peter M Swaziland, MD;  Location[facility];  Service: Cardiovascular;  Laterality: N/A;  . PERCUTANEOUS CORONARY STENT INTERVENTION (PCI-S)  02/07/2012   Procedure: PERCUTANEOUS CORONARY STENT INTERVENTION (PCI-S);  Surgeon: Peter M Swaziland, MD;  Location: [facility] CATH LAB;  Service: Cardiovascular;;  . TEE WITHOUT CARDIOVERSION N/A 06/03/2016   Procedure: TRANSESOPHAGEAL ECHOCARDIOGRAM (TEE);  Surgeon: [name], MD;  Location: [facility] ENDOSCOPY;  Service: Cardiovascular;  Laterality: N/A;  . TONSILLECTOMY AND ADENOIDECTOMY    . TOTAL ABDOMINAL HYSTERECTOMY  1985   FIBROID    MEDICATIONS: Current Outpatient Prescriptions on File Prior to Visit  Medication Sig Dispense Refill  . atorvastatin (LIPITOR) 80 MG tablet Take 1 tablet (80 mg total) by mouth daily at 6 PM. 30 tablet 2  . carvedilol (COREG) 6.25 MG tablet Take 6.25 mg by mouth 2 (two) times daily with a meal.    . carvedilol (COREG) 6.25 MG tablet take 1 tablet by mouth twice a day 180 tablet 0  . cetirizine (ZYRTEC) 10 MG tablet Take 10 mg by mouth daily as needed. Reported on 11/07/2015    . clopidogrel (PLAVIX) 75 MG tablet take 1 tablet by mouth once daily 30 tablet 6  . furosemide (LASIX) 20 MG tablet take 1 tablet by mouth once daily 30 tablet 0  . lisinopril (PRINIVIL,ZESTRIL) 5 MG tablet take 1 tablet by mouth once daily 90 tablet 0  . nitroGLYCERIN (NITROSTAT) 0.4 MG SL tablet place 1 tablet under the  tongue every 5 minutes if needed for chest pain 25 tablet 6  . potassium chloride (MICRO-K) 10 MEQ CR capsule take 1 capsule by mouth once daily 30 capsule 0   No current facility-administered medications on file prior to visit.     ALLERGIES: Allergies  Allergen Reactions  . Latex Itching  . Flagyl [Metronidazole] Rash    FAMILY HISTORY: Family History  Problem Relation Age of Onset  . Stroke Father     SOCIAL HISTORY: Social History   Social History  . Marital status: Legally Separated    Spouse name: N/A  . Number of children: N/A  . Years of education: N/A   Occupational History  . Not on file.   Social History Main Topics  . Smoking status: Former Smoker    Types: Cigarettes    Quit date: 04/10/2003  . Smokeless tobacco: Never Used     Comment: quit 2004  . Alcohol use Yes     Comment: occasional  . Drug use: No  . Sexual activity: Not Currently    Birth control/ protection:  Post-menopausal   Other Topics Concern  . 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   2 grown children, a daughter who lives in [place] area and a son who lives with the patient.    REVIEW OF SYSTEMS: Constitutional: No fevers, chills, or sweats, no generalized fatigue, change in appetite Eyes: No visual changes, double vision, eye pain Ear, nose and throat: No hearing loss, ear pain, nasal congestion, sore throat Cardiovascular: No chest pain, palpitations Respiratory:  No shortness of breath at rest or with exertion, wheezes GastrointestinaI: No nausea, vomiting, diarrhea, abdominal pain, fecal incontinence Genitourinary:  No dysuria, urinary retention or frequency Musculoskeletal:  No neck pain, back pain Integumentary: No rash, pruritus, skin lesions Neurological: as above Psychiatric: No depression, insomnia, anxiety Endocrine: No palpitations, fatigue, diaphoresis, mood  swings, change in appetite, change in weight, increased thirst Hematologic/Lymphatic:  No anemia, purpura, petechiae. Allergic/Immunologic: no itchy/runny eyes, nasal congestion, recent allergic reactions, rashes  PHYSICAL EXAM: Vitals:   08/24/16 1428  BP: 138/70  Pulse: 70   General: No acute distress Head:  Normocephalic/atraumatic Eyes: Fundoscopic exam shows bilateral sharp discs, no vessel changes, exudates, or hemorrhages Neck: supple, no paraspinal tenderness, full range of motion Back: No paraspinal tenderness Heart: regular rate and rhythm Lungs: Clear to auscultation bilaterally. Vascular: No carotid bruits. Skin/Extremities: No rash, no edema Neurological Exam: Mental status: alert and oriented to person, place, no dysarthria or aphasia, Fund of knowledge is appropriate.  Remote memory intact.  Attention and concentration are normal.    Able to name objects and repeat phrases. CDT 4/5 MMSE - Mini Mental State Exam 08/24/2016  Orientation to time 0  Orientation to Place 4  Registration 3  Attention/ Calculation 5  Recall 0  Language- name 2 objects 2  Language- repeat 1  Language- follow 3 step command 2  Language- read & follow direction 1  Write a sentence 1  Copy design 1  Total score 20   Cranial nerves: CN I: not tested CN II: pupils equal, round and reactive to light, visual fields intact, fundi unremarkable. CN III, IV, VI:  full range of motion, no nystagmus, no ptosis CN V: facial sensation intact CN VII: upper and lower face symmetric CN VIII: hearing intact to finger rub CN IX, X: gag intact, uvula midline CN XI: sternocleidomastoid and trapezius muscles intact CN XII: tongue midline Bulk & Tone: normal, no fasciculations. Motor: 5/5 throughout with no pronator drift. Sensation: intact to light touch, cold, pin, vibration and joint position sense.  No extinction to double simultaneous stimulation.  Romberg test negative Deep Tendon Reflexes: +1  throughout, no ankle clonus Plantar responses: downgoing bilaterally Cerebellar: no incoordination on finger to nose, heel to shin. No dysdiadochokinesia Gait: narrow-based and steady, able to tandem walk adequately. Tremor: none  IMPRESSION: This is a pleasant 83 year old right-handed woman with a history of hypertension, hyperlipidemia, strokes in 2004 and 2012, chronic subdural hematoma s/p evacuation, presenting for evaluation of worsening memory. She had an episode of slurred speech and decreased responsiveness last November, diagnosed as a TIA due to occluded right MCA. She is now on maximum medical management with long-term BP goal of 130-150 to avoid hypoperfusion. BP today 138/70. Her daughter noticed memory changes over the past 1-2 years, neurological exam today non-focal, MMSE 20/30. Symptoms suggestive of mild dementia, likely due to vascular dementia.  She may benefit from starting a cholinesterase inhibitor such as Aricept, side effects and expectations from the medication were discussed. We discussed the importance of secondary stroke prevention, continue Plavix and compliance with medications. She will be referred to Home Health to help with medication management. We discussed the importance of physical exercise and brain stimulation exercises for brain health. She does not drive, continue to monitor home safety. She will follow-up in 6 months and knows to call for any changes.   Thank you for allowing me to participate in the care of this patient. Please do not hesitate to call for any questions or concerns.   Patrcia DollyKaren Raymond Azure, M.D.  CC: Dr. Cleta Albertsaub, Hulen ShoutsElizabeth McVey, PA-C

## 2016-08-24 NOTE — Patient Instructions (Signed)
1. Start Aricept 10mg : Take 1/2 tablet daily for 1 month, then increase to 1 tablet daily 2. Refer to Home Health for medication management 3. Continue all your medications 4. Physical exercise and brain stimulation exercises (crossword puzzles, word search, reading, etc) are important for brain health 5. Follow-up in 6 months, call for any changes

## 2016-08-26 ENCOUNTER — Encounter: Payer: Self-pay | Admitting: Neurology

## 2016-08-26 DIAGNOSIS — N3941 Urge incontinence: Secondary | ICD-10-CM | POA: Diagnosis not present

## 2016-08-26 DIAGNOSIS — R350 Frequency of micturition: Secondary | ICD-10-CM | POA: Diagnosis not present

## 2016-08-26 DIAGNOSIS — N3944 Nocturnal enuresis: Secondary | ICD-10-CM | POA: Diagnosis not present

## 2016-08-26 DIAGNOSIS — R351 Nocturia: Secondary | ICD-10-CM | POA: Diagnosis not present

## 2016-08-26 DIAGNOSIS — F0390 Unspecified dementia without behavioral disturbance: Secondary | ICD-10-CM | POA: Insufficient documentation

## 2016-08-26 DIAGNOSIS — F03A0 Unspecified dementia, mild, without behavioral disturbance, psychotic disturbance, mood disturbance, and anxiety: Secondary | ICD-10-CM | POA: Insufficient documentation

## 2016-09-07 ENCOUNTER — Other Ambulatory Visit: Payer: Self-pay | Admitting: Family Medicine

## 2016-09-16 ENCOUNTER — Other Ambulatory Visit: Payer: Self-pay | Admitting: Emergency Medicine

## 2016-09-29 DIAGNOSIS — I739 Peripheral vascular disease, unspecified: Secondary | ICD-10-CM | POA: Diagnosis not present

## 2016-09-29 DIAGNOSIS — E784 Other hyperlipidemia: Secondary | ICD-10-CM | POA: Diagnosis not present

## 2016-09-29 DIAGNOSIS — I10 Essential (primary) hypertension: Secondary | ICD-10-CM | POA: Diagnosis not present

## 2016-09-29 DIAGNOSIS — I25118 Atherosclerotic heart disease of native coronary artery with other forms of angina pectoris: Secondary | ICD-10-CM | POA: Diagnosis not present

## 2016-10-07 ENCOUNTER — Other Ambulatory Visit: Payer: Self-pay | Admitting: Physician Assistant

## 2016-10-07 ENCOUNTER — Other Ambulatory Visit: Payer: Self-pay | Admitting: Emergency Medicine

## 2016-10-07 NOTE — Telephone Encounter (Signed)
Spoke with daughter. Patient needs to establish with PCP. They live in the [place] area, and this office is no longer conveient.  Meds ordered this encounter  Medications  . potassium chloride (MICRO-K) 10 MEQ CR capsule    Sig: take 1 capsule by mouth once daily    Dispense:  90 capsule    Refill:  0    Please notify patient that s/he needs an office visit +/- labsfor additional refills.

## 2016-10-28 DIAGNOSIS — K469 Unspecified abdominal hernia without obstruction or gangrene: Secondary | ICD-10-CM | POA: Diagnosis not present

## 2016-10-28 DIAGNOSIS — I252 Old myocardial infarction: Secondary | ICD-10-CM | POA: Diagnosis not present

## 2016-10-28 DIAGNOSIS — K559 Vascular disorder of intestine, unspecified: Secondary | ICD-10-CM | POA: Diagnosis not present

## 2016-10-28 DIAGNOSIS — Z4682 Encounter for fitting and adjustment of non-vascular catheter: Secondary | ICD-10-CM | POA: Diagnosis not present

## 2016-10-28 DIAGNOSIS — R197 Diarrhea, unspecified: Secondary | ICD-10-CM | POA: Diagnosis not present

## 2016-10-28 DIAGNOSIS — Z87891 Personal history of nicotine dependence: Secondary | ICD-10-CM | POA: Diagnosis not present

## 2016-10-28 DIAGNOSIS — I509 Heart failure, unspecified: Secondary | ICD-10-CM | POA: Diagnosis not present

## 2016-10-28 DIAGNOSIS — K6389 Other specified diseases of intestine: Secondary | ICD-10-CM | POA: Diagnosis not present

## 2016-10-28 DIAGNOSIS — K388 Other specified diseases of appendix: Secondary | ICD-10-CM | POA: Diagnosis not present

## 2016-10-28 DIAGNOSIS — K436 Other and unspecified ventral hernia with obstruction, without gangrene: Secondary | ICD-10-CM | POA: Diagnosis not present

## 2016-10-28 DIAGNOSIS — K55021 Focal (segmental) acute infarction of small intestine: Secondary | ICD-10-CM | POA: Diagnosis not present

## 2016-10-28 DIAGNOSIS — K55029 Acute infarction of small intestine, extent unspecified: Secondary | ICD-10-CM | POA: Diagnosis not present

## 2016-10-28 DIAGNOSIS — G8918 Other acute postprocedural pain: Secondary | ICD-10-CM | POA: Diagnosis not present

## 2016-10-28 DIAGNOSIS — R69 Illness, unspecified: Secondary | ICD-10-CM | POA: Diagnosis not present

## 2016-10-28 DIAGNOSIS — R112 Nausea with vomiting, unspecified: Secondary | ICD-10-CM | POA: Diagnosis not present

## 2016-10-28 DIAGNOSIS — Z883 Allergy status to other anti-infective agents status: Secondary | ICD-10-CM | POA: Diagnosis not present

## 2016-10-28 DIAGNOSIS — K56609 Unspecified intestinal obstruction, unspecified as to partial versus complete obstruction: Secondary | ICD-10-CM | POA: Diagnosis not present

## 2016-10-28 DIAGNOSIS — Z8673 Personal history of transient ischemic attack (TIA), and cerebral infarction without residual deficits: Secondary | ICD-10-CM | POA: Diagnosis not present

## 2016-10-28 DIAGNOSIS — R1031 Right lower quadrant pain: Secondary | ICD-10-CM | POA: Diagnosis not present

## 2016-10-28 DIAGNOSIS — K565 Intestinal adhesions [bands], unspecified as to partial versus complete obstruction: Secondary | ICD-10-CM | POA: Diagnosis not present

## 2016-10-28 DIAGNOSIS — I110 Hypertensive heart disease with heart failure: Secondary | ICD-10-CM | POA: Diagnosis not present

## 2016-10-28 DIAGNOSIS — I2510 Atherosclerotic heart disease of native coronary artery without angina pectoris: Secondary | ICD-10-CM | POA: Diagnosis not present

## 2016-11-04 DIAGNOSIS — I509 Heart failure, unspecified: Secondary | ICD-10-CM | POA: Diagnosis not present

## 2016-11-04 DIAGNOSIS — Z48815 Encounter for surgical aftercare following surgery on the digestive system: Secondary | ICD-10-CM | POA: Diagnosis not present

## 2016-11-04 DIAGNOSIS — I110 Hypertensive heart disease with heart failure: Secondary | ICD-10-CM | POA: Diagnosis not present

## 2016-11-04 DIAGNOSIS — I2510 Atherosclerotic heart disease of native coronary artery without angina pectoris: Secondary | ICD-10-CM | POA: Diagnosis not present

## 2016-11-04 DIAGNOSIS — Z87891 Personal history of nicotine dependence: Secondary | ICD-10-CM | POA: Diagnosis not present

## 2016-11-07 DIAGNOSIS — I10 Essential (primary) hypertension: Secondary | ICD-10-CM | POA: Diagnosis not present

## 2016-11-07 DIAGNOSIS — Z48815 Encounter for surgical aftercare following surgery on the digestive system: Secondary | ICD-10-CM | POA: Diagnosis not present

## 2016-11-07 DIAGNOSIS — I509 Heart failure, unspecified: Secondary | ICD-10-CM | POA: Diagnosis not present

## 2016-11-07 DIAGNOSIS — K565 Intestinal adhesions [bands], unspecified as to partial versus complete obstruction: Secondary | ICD-10-CM | POA: Diagnosis not present

## 2016-11-07 DIAGNOSIS — I25118 Atherosclerotic heart disease of native coronary artery with other forms of angina pectoris: Secondary | ICD-10-CM | POA: Diagnosis not present

## 2016-11-07 DIAGNOSIS — Z8673 Personal history of transient ischemic attack (TIA), and cerebral infarction without residual deficits: Secondary | ICD-10-CM | POA: Diagnosis not present

## 2016-11-07 DIAGNOSIS — I110 Hypertensive heart disease with heart failure: Secondary | ICD-10-CM | POA: Diagnosis not present

## 2016-11-07 DIAGNOSIS — I2510 Atherosclerotic heart disease of native coronary artery without angina pectoris: Secondary | ICD-10-CM | POA: Diagnosis not present

## 2016-11-07 DIAGNOSIS — Z87891 Personal history of nicotine dependence: Secondary | ICD-10-CM | POA: Diagnosis not present

## 2016-11-08 DIAGNOSIS — Z87891 Personal history of nicotine dependence: Secondary | ICD-10-CM | POA: Diagnosis not present

## 2016-11-08 DIAGNOSIS — I509 Heart failure, unspecified: Secondary | ICD-10-CM | POA: Diagnosis not present

## 2016-11-08 DIAGNOSIS — I2510 Atherosclerotic heart disease of native coronary artery without angina pectoris: Secondary | ICD-10-CM | POA: Diagnosis not present

## 2016-11-08 DIAGNOSIS — Z48815 Encounter for surgical aftercare following surgery on the digestive system: Secondary | ICD-10-CM | POA: Diagnosis not present

## 2016-11-08 DIAGNOSIS — I110 Hypertensive heart disease with heart failure: Secondary | ICD-10-CM | POA: Diagnosis not present

## 2016-11-09 DIAGNOSIS — R269 Unspecified abnormalities of gait and mobility: Secondary | ICD-10-CM | POA: Diagnosis not present

## 2016-11-10 DIAGNOSIS — Z48815 Encounter for surgical aftercare following surgery on the digestive system: Secondary | ICD-10-CM | POA: Diagnosis not present

## 2016-11-10 DIAGNOSIS — Z87891 Personal history of nicotine dependence: Secondary | ICD-10-CM | POA: Diagnosis not present

## 2016-11-10 DIAGNOSIS — I509 Heart failure, unspecified: Secondary | ICD-10-CM | POA: Diagnosis not present

## 2016-11-10 DIAGNOSIS — I110 Hypertensive heart disease with heart failure: Secondary | ICD-10-CM | POA: Diagnosis not present

## 2016-11-10 DIAGNOSIS — I2510 Atherosclerotic heart disease of native coronary artery without angina pectoris: Secondary | ICD-10-CM | POA: Diagnosis not present

## 2016-11-11 DIAGNOSIS — I2510 Atherosclerotic heart disease of native coronary artery without angina pectoris: Secondary | ICD-10-CM | POA: Diagnosis not present

## 2016-11-11 DIAGNOSIS — I110 Hypertensive heart disease with heart failure: Secondary | ICD-10-CM | POA: Diagnosis not present

## 2016-11-11 DIAGNOSIS — Z87891 Personal history of nicotine dependence: Secondary | ICD-10-CM | POA: Diagnosis not present

## 2016-11-11 DIAGNOSIS — Z48815 Encounter for surgical aftercare following surgery on the digestive system: Secondary | ICD-10-CM | POA: Diagnosis not present

## 2016-11-11 DIAGNOSIS — I509 Heart failure, unspecified: Secondary | ICD-10-CM | POA: Diagnosis not present

## 2016-11-15 DIAGNOSIS — Z87891 Personal history of nicotine dependence: Secondary | ICD-10-CM | POA: Diagnosis not present

## 2016-11-15 DIAGNOSIS — I509 Heart failure, unspecified: Secondary | ICD-10-CM | POA: Diagnosis not present

## 2016-11-15 DIAGNOSIS — I2510 Atherosclerotic heart disease of native coronary artery without angina pectoris: Secondary | ICD-10-CM | POA: Diagnosis not present

## 2016-11-15 DIAGNOSIS — Z48815 Encounter for surgical aftercare following surgery on the digestive system: Secondary | ICD-10-CM | POA: Diagnosis not present

## 2016-11-15 DIAGNOSIS — I110 Hypertensive heart disease with heart failure: Secondary | ICD-10-CM | POA: Diagnosis not present

## 2016-11-21 DIAGNOSIS — Z48815 Encounter for surgical aftercare following surgery on the digestive system: Secondary | ICD-10-CM | POA: Diagnosis not present

## 2016-11-21 DIAGNOSIS — I2510 Atherosclerotic heart disease of native coronary artery without angina pectoris: Secondary | ICD-10-CM | POA: Diagnosis not present

## 2016-11-21 DIAGNOSIS — I110 Hypertensive heart disease with heart failure: Secondary | ICD-10-CM | POA: Diagnosis not present

## 2016-11-21 DIAGNOSIS — Z87891 Personal history of nicotine dependence: Secondary | ICD-10-CM | POA: Diagnosis not present

## 2016-11-21 DIAGNOSIS — I509 Heart failure, unspecified: Secondary | ICD-10-CM | POA: Diagnosis not present

## 2016-11-22 ENCOUNTER — Other Ambulatory Visit: Payer: Self-pay | Admitting: Cardiology

## 2016-11-23 DIAGNOSIS — Z8719 Personal history of other diseases of the digestive system: Secondary | ICD-10-CM | POA: Diagnosis not present

## 2016-11-23 DIAGNOSIS — I2510 Atherosclerotic heart disease of native coronary artery without angina pectoris: Secondary | ICD-10-CM | POA: Diagnosis not present

## 2016-11-23 DIAGNOSIS — Z48815 Encounter for surgical aftercare following surgery on the digestive system: Secondary | ICD-10-CM | POA: Diagnosis not present

## 2016-11-23 DIAGNOSIS — R4182 Altered mental status, unspecified: Secondary | ICD-10-CM | POA: Diagnosis not present

## 2016-11-23 DIAGNOSIS — I25118 Atherosclerotic heart disease of native coronary artery with other forms of angina pectoris: Secondary | ICD-10-CM | POA: Diagnosis not present

## 2016-11-23 DIAGNOSIS — I10 Essential (primary) hypertension: Secondary | ICD-10-CM | POA: Diagnosis not present

## 2016-11-23 DIAGNOSIS — I509 Heart failure, unspecified: Secondary | ICD-10-CM | POA: Diagnosis not present

## 2016-11-23 DIAGNOSIS — I110 Hypertensive heart disease with heart failure: Secondary | ICD-10-CM | POA: Diagnosis not present

## 2016-11-23 DIAGNOSIS — Z87891 Personal history of nicotine dependence: Secondary | ICD-10-CM | POA: Diagnosis not present

## 2016-11-25 DIAGNOSIS — I110 Hypertensive heart disease with heart failure: Secondary | ICD-10-CM | POA: Diagnosis not present

## 2016-11-25 DIAGNOSIS — Z48815 Encounter for surgical aftercare following surgery on the digestive system: Secondary | ICD-10-CM | POA: Diagnosis not present

## 2016-11-25 DIAGNOSIS — Z87891 Personal history of nicotine dependence: Secondary | ICD-10-CM | POA: Diagnosis not present

## 2016-11-25 DIAGNOSIS — I2510 Atherosclerotic heart disease of native coronary artery without angina pectoris: Secondary | ICD-10-CM | POA: Diagnosis not present

## 2016-11-25 DIAGNOSIS — I509 Heart failure, unspecified: Secondary | ICD-10-CM | POA: Diagnosis not present

## 2016-11-28 DIAGNOSIS — I110 Hypertensive heart disease with heart failure: Secondary | ICD-10-CM | POA: Diagnosis not present

## 2016-11-28 DIAGNOSIS — I509 Heart failure, unspecified: Secondary | ICD-10-CM | POA: Diagnosis not present

## 2016-11-28 DIAGNOSIS — Z48815 Encounter for surgical aftercare following surgery on the digestive system: Secondary | ICD-10-CM | POA: Diagnosis not present

## 2016-11-28 DIAGNOSIS — I2510 Atherosclerotic heart disease of native coronary artery without angina pectoris: Secondary | ICD-10-CM | POA: Diagnosis not present

## 2016-11-28 DIAGNOSIS — Z87891 Personal history of nicotine dependence: Secondary | ICD-10-CM | POA: Diagnosis not present

## 2016-11-28 DIAGNOSIS — R399 Unspecified symptoms and signs involving the genitourinary system: Secondary | ICD-10-CM | POA: Diagnosis not present

## 2016-11-30 DIAGNOSIS — Z87891 Personal history of nicotine dependence: Secondary | ICD-10-CM | POA: Diagnosis not present

## 2016-11-30 DIAGNOSIS — Z48815 Encounter for surgical aftercare following surgery on the digestive system: Secondary | ICD-10-CM | POA: Diagnosis not present

## 2016-11-30 DIAGNOSIS — I509 Heart failure, unspecified: Secondary | ICD-10-CM | POA: Diagnosis not present

## 2016-11-30 DIAGNOSIS — I110 Hypertensive heart disease with heart failure: Secondary | ICD-10-CM | POA: Diagnosis not present

## 2016-11-30 DIAGNOSIS — I2510 Atherosclerotic heart disease of native coronary artery without angina pectoris: Secondary | ICD-10-CM | POA: Diagnosis not present

## 2016-12-06 DIAGNOSIS — I509 Heart failure, unspecified: Secondary | ICD-10-CM | POA: Diagnosis not present

## 2016-12-06 DIAGNOSIS — I2510 Atherosclerotic heart disease of native coronary artery without angina pectoris: Secondary | ICD-10-CM | POA: Diagnosis not present

## 2016-12-06 DIAGNOSIS — Z87891 Personal history of nicotine dependence: Secondary | ICD-10-CM | POA: Diagnosis not present

## 2016-12-06 DIAGNOSIS — Z48815 Encounter for surgical aftercare following surgery on the digestive system: Secondary | ICD-10-CM | POA: Diagnosis not present

## 2016-12-06 DIAGNOSIS — I110 Hypertensive heart disease with heart failure: Secondary | ICD-10-CM | POA: Diagnosis not present

## 2016-12-08 DIAGNOSIS — I110 Hypertensive heart disease with heart failure: Secondary | ICD-10-CM | POA: Diagnosis not present

## 2016-12-08 DIAGNOSIS — Z48815 Encounter for surgical aftercare following surgery on the digestive system: Secondary | ICD-10-CM | POA: Diagnosis not present

## 2016-12-08 DIAGNOSIS — I509 Heart failure, unspecified: Secondary | ICD-10-CM | POA: Diagnosis not present

## 2016-12-08 DIAGNOSIS — Z87891 Personal history of nicotine dependence: Secondary | ICD-10-CM | POA: Diagnosis not present

## 2016-12-08 DIAGNOSIS — I2510 Atherosclerotic heart disease of native coronary artery without angina pectoris: Secondary | ICD-10-CM | POA: Diagnosis not present

## 2016-12-14 DIAGNOSIS — I509 Heart failure, unspecified: Secondary | ICD-10-CM | POA: Diagnosis not present

## 2016-12-14 DIAGNOSIS — R05 Cough: Secondary | ICD-10-CM | POA: Diagnosis not present

## 2016-12-14 DIAGNOSIS — I10 Essential (primary) hypertension: Secondary | ICD-10-CM | POA: Diagnosis not present

## 2016-12-14 DIAGNOSIS — I2510 Atherosclerotic heart disease of native coronary artery without angina pectoris: Secondary | ICD-10-CM | POA: Diagnosis not present

## 2016-12-19 ENCOUNTER — Other Ambulatory Visit: Payer: Self-pay | Admitting: Physician Assistant

## 2016-12-21 DIAGNOSIS — I10 Essential (primary) hypertension: Secondary | ICD-10-CM | POA: Diagnosis not present

## 2016-12-21 DIAGNOSIS — H6122 Impacted cerumen, left ear: Secondary | ICD-10-CM | POA: Diagnosis not present

## 2016-12-21 DIAGNOSIS — I2510 Atherosclerotic heart disease of native coronary artery without angina pectoris: Secondary | ICD-10-CM | POA: Diagnosis not present

## 2016-12-21 DIAGNOSIS — H6121 Impacted cerumen, right ear: Secondary | ICD-10-CM | POA: Diagnosis not present

## 2016-12-24 ENCOUNTER — Other Ambulatory Visit: Payer: Self-pay | Admitting: Physician Assistant

## 2017-01-26 ENCOUNTER — Other Ambulatory Visit: Payer: Self-pay

## 2017-02-08 DIAGNOSIS — K219 Gastro-esophageal reflux disease without esophagitis: Secondary | ICD-10-CM | POA: Diagnosis not present

## 2017-02-08 DIAGNOSIS — E785 Hyperlipidemia, unspecified: Secondary | ICD-10-CM | POA: Diagnosis not present

## 2017-02-08 DIAGNOSIS — H6123 Impacted cerumen, bilateral: Secondary | ICD-10-CM | POA: Diagnosis not present

## 2017-02-08 DIAGNOSIS — I10 Essential (primary) hypertension: Secondary | ICD-10-CM | POA: Diagnosis not present

## 2017-02-08 DIAGNOSIS — I2510 Atherosclerotic heart disease of native coronary artery without angina pectoris: Secondary | ICD-10-CM | POA: Diagnosis not present

## 2017-03-03 ENCOUNTER — Ambulatory Visit (INDEPENDENT_AMBULATORY_CARE_PROVIDER_SITE_OTHER): Payer: Medicare HMO | Admitting: Neurology

## 2017-03-03 ENCOUNTER — Encounter: Payer: Self-pay | Admitting: Neurology

## 2017-03-03 VITALS — BP 128/76 | HR 69 | Ht 58.5 in | Wt 122.0 lb

## 2017-03-03 DIAGNOSIS — F0390 Unspecified dementia without behavioral disturbance: Secondary | ICD-10-CM | POA: Diagnosis not present

## 2017-03-03 DIAGNOSIS — R69 Illness, unspecified: Secondary | ICD-10-CM | POA: Diagnosis not present

## 2017-03-03 DIAGNOSIS — G459 Transient cerebral ischemic attack, unspecified: Secondary | ICD-10-CM | POA: Diagnosis not present

## 2017-03-03 DIAGNOSIS — F03A0 Unspecified dementia, mild, without behavioral disturbance, psychotic disturbance, mood disturbance, and anxiety: Secondary | ICD-10-CM

## 2017-03-03 MED ORDER — DONEPEZIL HCL 10 MG PO TABS: ORAL_TABLET | ORAL | 3 refills | Status: AC

## 2017-03-03 NOTE — Patient Instructions (Signed)
1. Continue Donepezil 10mg  daily 2. Proceed with PACE day program 3. Look into getting more help at home 4. Follow-up in 6 months, call for any changes

## 2017-03-03 NOTE — Progress Notes (Signed)
NEUROLOGY FOLLOW UP OFFICE NOTE  [name] [id number] 28-Oct-1933  HISTORY OF PRESENT ILLNESS: I had the pleasure of seeing [name] in follow-up in the neurology clinic on 03/03/2017.  The patient was last seen 6 months ago for mild dementia and is accompanied by her daughter who helps supplement the history today. MMSE in February 2018 was 20/30. She was started on Donepezil 10mg  daily, which she is tolerating without side effects. She feels her memory is fine. Her daughter reports she is doing okay but reports that she does see gradual worsening. It takes her a long time to get ready to go out, she goes back and forth to the bathroom and would get distracted easily, watching TV or doing something else in between. She does not drive. Her daughter is in charge of finances and administers medications. She needs some assistance with dressing and bathing. She used to have a caregiver coming a couple of days a week, she is now awaiting news if she can join the PACE program. She denies any headaches, dizziness, diplopia, focal numbness/tingling/weakness, no falls.   HPI 08/24/2016: This is a pleasant 83 yo RH woman with a history of hypertension, hyperlipidemia, strokes in 2004 and 2012, chronic subdural hematoma s/p evacuation, who presented for evaluation of worsening memory. She feels her memory is okay. She states she drives and denies missing her medications. Her daughter started noticing memory changes a year ago. She was living in an independent living facility until she was admitted to [facility] last 06/01/16 for a TIA. Since then, she has been living with her daughter. Her daughter reports that she stopped driving many years ago. She is able to take her medications but takes a long time because she keeps reading and re-reading the medication bottles. Her daughter is in charge of her medications now. She would not recall what she previously did, bathing and dressing is a long process because she  goes back and forth into the bathroom to get all the things she needs. She had been working at [employer] until 4 years ago. Her daughter has been in charge of bill payments for the past 2 years because she was missing bill payments. Her daughter denies any personality changes, paranoia, or hallucinations. She denies any family history of dementia. No history of significant head injuries, she rarely drinks alcohol.   She had a right frontotemporal stroke and chronic subdural hematoma that was evacuated in 2004. She had a right internal capsule infarct in 2012 with residual mild left-sided weakness.  She denies any headaches, dizziness, diplopia, dysarthria, dysphagia, neck/back pain, focal numbness/tingling, bowel/bladder dysfunction. No anosmia, tremors, no falls. She was admitted to [facility] last November 2017 after she was found slumped on the toilet with slurred speech and facial droop, unresponsive for 20 minutes. There was no acute stroke on MRI. There was slow/occluded flow in the right MCA from the ICA terminus, extensive intracranial atherosclerotic disease, slight progression of multiple remote lacunar infarcts and diffuse white matter changes. She had an EEG which showed occasional slowing over the right frontocentral region, breach artifact in this region, no epileptiform discharges seen. She had a TEE which showed EF of 55-60%, no LAA thrombus, small intermittent PFO with right to left flow after valsalva. She had an event monitor for a month with isolated PVCs and PACs, no atrial fibrillation. She was seen by stroke neurologist Dr. Roda Shutters, recommendation was to continue DAPT for 3 months, then switch to Plavix alone. She is now on  Plavix monotherapy. Long-term BP goal of 130-150 due to right MCA occlusion. There was no orthostatic hypotension in the hospital. She was started on Lipitor 80mg  daily for LDL of 151, goal <70.   PAST MEDICAL HISTORY: Past Medical History:  Diagnosis Date  . Abnormal CT of the  abdomen    Nodes  resolved  . Arthritis   . Coronary artery disease    a. [place] s/p BMS to prox LAD (with residual moderate mid-LAD & mid-RCA dz for medical therapy) 01/2012 - EF 40% at that time by cath.  . CVA (cerebral vascular accident) (HCC)    Right brain CVA 04/2011 - carotid dopplers showed no significant stenosis (tortuous ICA).   . Diverticulosis   . Hypertension   . Inguinal hernia    Right, seen on CT 2008  . LV dysfunction    EF 40% by cath 01/2012 with WMA, but 50% by f/u echo same admission  . Osteoporosis   . Pyelonephritis 2006  . Subdural hematoma (HCC)    2004 s/p craniotomy    MEDICATIONS: Current Outpatient Prescriptions on File Prior to Visit  Medication Sig Dispense Refill  . atorvastatin (LIPITOR) 80 MG tablet take 1 tablet by mouth once daily AT 6PM 30 tablet 1  . carvedilol (COREG) 6.25 MG tablet Take 6.25 mg by mouth 2 (two) times daily with a meal.    . carvedilol (COREG) 6.25 MG tablet take 1 tablet by mouth twice a day 180 tablet 0  . cetirizine (ZYRTEC) 10 MG tablet Take 10 mg by mouth daily as needed. Reported on 11/07/2015    . clopidogrel (PLAVIX) 75 MG tablet take 1 tablet by mouth once daily 30 tablet 6  . donepezil (ARICEPT) 10 MG tablet Take 1/2 tablet daily for 1 month, then increase to 1 tablet daily 30 tablet 11  . furosemide (LASIX) 20 MG tablet take 1 tablet by mouth once daily 90 tablet 0  . lisinopril (PRINIVIL,ZESTRIL) 5 MG tablet Take 1 tablet (5 mg total) by mouth daily. 90 tablet 0  . nitroGLYCERIN (NITROSTAT) 0.4 MG SL tablet place 1 tablet under the tongue every 5 minutes if needed for chest pain 25 tablet 6  . potassium chloride (MICRO-K) 10 MEQ CR capsule take 1 capsule by mouth once daily 90 capsule 0   No current facility-administered medications on file prior to visit.     ALLERGIES: Allergies  Allergen Reactions  . Latex Itching  . Flagyl [Metronidazole] Rash    FAMILY HISTORY: Family History  Problem Relation Age of  Onset  . Stroke Father     SOCIAL HISTORY: Social History   Social History  . Marital status: Legally Separated    Spouse name: N/A  . Number of children: N/A  . Years of education: N/A   Occupational History  . Not on file.   Social History Main Topics  . Smoking status: Former Smoker    Types: Cigarettes    Quit date: 04/10/2003  . Smokeless tobacco: Never Used     Comment: quit 2004  . Alcohol use Yes     Comment: occasional  . Drug use: No  . Sexual activity: Not Currently    Birth control/ protection: Post-menopausal   Other Topics Concern  . 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   2 grown children, a daughter who lives in [place]  area and a son who lives with the patient.    REVIEW OF SYSTEMS: Constitutional: No fevers, chills, or sweats, no generalized fatigue, change in appetite Eyes: No visual changes, double vision, eye pain Ear, nose and throat: No hearing loss, ear pain, nasal congestion, sore throat Cardiovascular: No chest pain, palpitations Respiratory:  No shortness of breath at rest or with exertion, wheezes GastrointestinaI: No nausea, vomiting, diarrhea, abdominal pain, fecal incontinence Genitourinary:  No dysuria, urinary retention or frequency Musculoskeletal:  No neck pain, back pain Integumentary: No rash, pruritus, skin lesions Neurological: as above Psychiatric: No depression, insomnia, anxiety Endocrine: No palpitations, fatigue, diaphoresis, mood swings, change in appetite, change in weight, increased thirst Hematologic/Lymphatic:  No anemia, purpura, petechiae. Allergic/Immunologic: no itchy/runny eyes, nasal congestion, recent allergic reactions, rashes  PHYSICAL EXAM: Vitals:   03/03/17 1552  BP: 128/76  Pulse: 69  SpO2: 98%   General: No acute distress Head:  Normocephalic/atraumatic Neck: supple, no paraspinal tenderness,  full range of motion Heart:  Regular rate and rhythm Lungs:  Clear to auscultation bilaterally Back: No paraspinal tenderness Skin/Extremities: No rash, no edema Neurological Exam: alert and oriented to person, did not know city, knows we are in hospital in [place], summer. No aphasia or dysarthria. Fund of knowledge is appropriate.  Recent and remote memory are impaired.  Attention and concentration are normal, able to spell WORLD backwards.   Able to name objects and repeat phrases. CDT 4/5 MMSE - Mini Mental State Exam 03/03/2017 08/24/2016  Orientation to time 1 0  Orientation to Place 3 4  Registration 3 3  Attention/ Calculation 5 5  Recall 0 0  Language- name 2 objects 2 2  Language- repeat 1 1  Language- follow 3 step command 3 2  Language- read & follow direction 1 1  Write a sentence 1 1  Copy design 1 1  Total score 21 20   Cranial nerves: Pupils equal, round. No facial asymmetry. Motor: moves all extremities symmetrically. Gait slow and cautious  IMPRESSION: This is a pleasant 83 yo RH woman with a history of hypertension, hyperlipidemia, strokes in 2004 and 2012, chronic subdural hematoma s/p evacuation, with mild dementia, likely vascular dementia. MMSE today 21/30. She is tolerating Donepezil 10mg  daily without side effects, continue current medication. No further episodes of slurred speech and decreased responsiveness since November 2017 (diagnosed as a TIA due to occluded right MCA). Continue maximum medical management with long-term BP goal of 130-150 to avoid hypoperfusion. Continue secondary stroke prevention with control of vascular risk factors, continue Plavix and compliance with medications. She is awaiting news about the PACE day program, we discussed the importance of physical exercise and brain stimulation exercises for brain health. She does not drive, continue to monitor home safety, recommend getting more in-home help. She will follow-up in 6 months and knows to call for  any changes.   Thank you for allowing me to participate in her care.  Please do not hesitate to call for any questions or concerns.  The duration of this appointment visit was 25 minutes of face-to-face time with the patient.  Greater than 50% of this time was spent in counseling, explanation of diagnosis, planning of further management, and coordination of care.   Patrcia Dolly, M.D.

## 2017-04-03 DIAGNOSIS — I25118 Atherosclerotic heart disease of native coronary artery with other forms of angina pectoris: Secondary | ICD-10-CM | POA: Diagnosis not present

## 2017-04-03 DIAGNOSIS — I2510 Atherosclerotic heart disease of native coronary artery without angina pectoris: Secondary | ICD-10-CM | POA: Diagnosis not present

## 2017-04-03 DIAGNOSIS — I509 Heart failure, unspecified: Secondary | ICD-10-CM | POA: Diagnosis not present

## 2017-04-03 DIAGNOSIS — J309 Allergic rhinitis, unspecified: Secondary | ICD-10-CM | POA: Diagnosis not present

## 2017-04-03 DIAGNOSIS — I10 Essential (primary) hypertension: Secondary | ICD-10-CM | POA: Diagnosis not present

## 2017-05-10 ENCOUNTER — Other Ambulatory Visit: Payer: Self-pay | Admitting: Physician Assistant

## 2017-05-10 NOTE — Telephone Encounter (Signed)
Patient goes to see another provider and doesn't need a refill on any medicine from us

## 2017-05-15 DIAGNOSIS — H6123 Impacted cerumen, bilateral: Secondary | ICD-10-CM | POA: Diagnosis not present

## 2017-05-25 ENCOUNTER — Other Ambulatory Visit: Payer: Self-pay | Admitting: Physician Assistant

## 2017-05-31 ENCOUNTER — Other Ambulatory Visit: Payer: Self-pay | Admitting: Physician Assistant

## 2017-06-09 DIAGNOSIS — I509 Heart failure, unspecified: Secondary | ICD-10-CM | POA: Diagnosis not present

## 2017-06-09 DIAGNOSIS — I10 Essential (primary) hypertension: Secondary | ICD-10-CM | POA: Diagnosis not present

## 2017-06-09 DIAGNOSIS — Z23 Encounter for immunization: Secondary | ICD-10-CM | POA: Diagnosis not present

## 2017-06-09 DIAGNOSIS — I2510 Atherosclerotic heart disease of native coronary artery without angina pectoris: Secondary | ICD-10-CM | POA: Diagnosis not present

## 2017-06-09 DIAGNOSIS — M25562 Pain in left knee: Secondary | ICD-10-CM | POA: Diagnosis not present

## 2017-09-01 DIAGNOSIS — E785 Hyperlipidemia, unspecified: Secondary | ICD-10-CM | POA: Diagnosis not present

## 2017-09-05 ENCOUNTER — Ambulatory Visit: Payer: Medicare HMO | Admitting: Neurology

## 2017-11-07 DIAGNOSIS — R197 Diarrhea, unspecified: Secondary | ICD-10-CM | POA: Diagnosis not present

## 2017-11-07 DIAGNOSIS — Z91040 Latex allergy status: Secondary | ICD-10-CM | POA: Diagnosis not present

## 2017-11-07 DIAGNOSIS — E876 Hypokalemia: Secondary | ICD-10-CM | POA: Diagnosis not present

## 2017-11-07 DIAGNOSIS — I509 Heart failure, unspecified: Secondary | ICD-10-CM | POA: Diagnosis not present

## 2017-11-07 DIAGNOSIS — E785 Hyperlipidemia, unspecified: Secondary | ICD-10-CM | POA: Diagnosis not present

## 2017-11-07 DIAGNOSIS — Z8673 Personal history of transient ischemic attack (TIA), and cerebral infarction without residual deficits: Secondary | ICD-10-CM | POA: Diagnosis not present

## 2017-11-07 DIAGNOSIS — R69 Illness, unspecified: Secondary | ICD-10-CM | POA: Diagnosis not present

## 2017-11-07 DIAGNOSIS — I10 Essential (primary) hypertension: Secondary | ICD-10-CM | POA: Diagnosis not present

## 2017-11-07 DIAGNOSIS — N390 Urinary tract infection, site not specified: Secondary | ICD-10-CM | POA: Diagnosis not present

## 2017-11-07 DIAGNOSIS — H1033 Unspecified acute conjunctivitis, bilateral: Secondary | ICD-10-CM | POA: Diagnosis not present

## 2017-11-07 DIAGNOSIS — I2510 Atherosclerotic heart disease of native coronary artery without angina pectoris: Secondary | ICD-10-CM | POA: Diagnosis not present

## 2017-11-07 DIAGNOSIS — Z0189 Encounter for other specified special examinations: Secondary | ICD-10-CM | POA: Diagnosis not present

## 2017-11-08 DIAGNOSIS — Z0189 Encounter for other specified special examinations: Secondary | ICD-10-CM | POA: Diagnosis not present

## 2017-12-06 ENCOUNTER — Encounter: Payer: Self-pay | Admitting: Family Medicine

## 2017-12-16 ENCOUNTER — Other Ambulatory Visit: Payer: Self-pay | Admitting: Physician Assistant

## 2017-12-31 DIAGNOSIS — R600 Localized edema: Secondary | ICD-10-CM | POA: Diagnosis not present

## 2018-01-04 DIAGNOSIS — Z13820 Encounter for screening for osteoporosis: Secondary | ICD-10-CM | POA: Diagnosis not present

## 2018-01-04 DIAGNOSIS — I10 Essential (primary) hypertension: Secondary | ICD-10-CM | POA: Diagnosis not present

## 2018-01-04 DIAGNOSIS — I509 Heart failure, unspecified: Secondary | ICD-10-CM | POA: Diagnosis not present

## 2018-01-04 DIAGNOSIS — R600 Localized edema: Secondary | ICD-10-CM | POA: Diagnosis not present

## 2018-01-12 DIAGNOSIS — Z0000 Encounter for general adult medical examination without abnormal findings: Secondary | ICD-10-CM | POA: Diagnosis not present

## 2018-01-12 DIAGNOSIS — Z23 Encounter for immunization: Secondary | ICD-10-CM | POA: Diagnosis not present

## 2018-01-12 DIAGNOSIS — R350 Frequency of micturition: Secondary | ICD-10-CM | POA: Diagnosis not present

## 2018-01-12 DIAGNOSIS — N3001 Acute cystitis with hematuria: Secondary | ICD-10-CM | POA: Diagnosis not present

## 2018-01-12 DIAGNOSIS — E785 Hyperlipidemia, unspecified: Secondary | ICD-10-CM | POA: Diagnosis not present

## 2018-01-12 DIAGNOSIS — E876 Hypokalemia: Secondary | ICD-10-CM | POA: Diagnosis not present

## 2018-01-30 DIAGNOSIS — I509 Heart failure, unspecified: Secondary | ICD-10-CM | POA: Diagnosis not present

## 2018-02-07 IMAGING — CT CT ANGIO NECK
1 of 12 series · 5 of 33 positions shown · IV contrast (isovue)
Comparison: 06/01/2016 brain MRI.

CLINICAL DATA: Dysarthria and left facial droop with spontaneous
normalization.

EXAM:
CT ANGIOGRAPHY HEAD AND NECK
TECHNIQUE: Multidetector CT imaging of the head and neck was performed using
the standard protocol during bolus administration of intravenous
contrast. Multiplanar CT image reconstructions and MIPs were
obtained to evaluate the vascular anatomy. Carotid stenosis
measurements (when applicable) are obtained utilizing NASCET
criteria, using the distal internal carotid diameter as the
denominator.
CONTRAST:  50 cc Isovue 370 intravenous

[Series 21: cta neck axial · axial · 0.39mm/px · z∈[-180,+44]mm · 5 of 327 slices shown]
[im 55/327  soft-tissue]
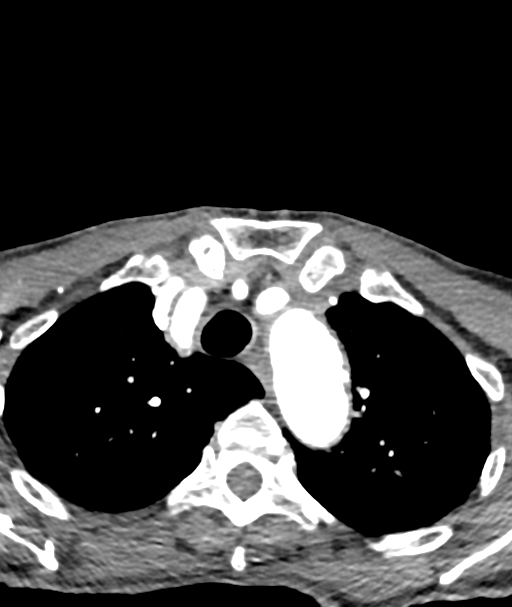
[im 109/327  bone]
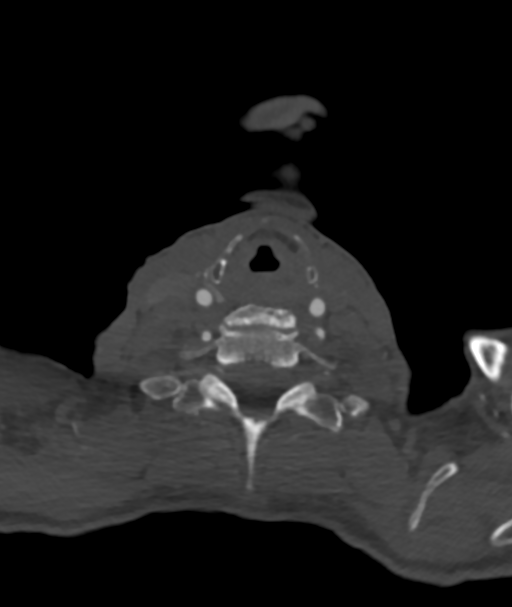
[im 164/327  soft-tissue]
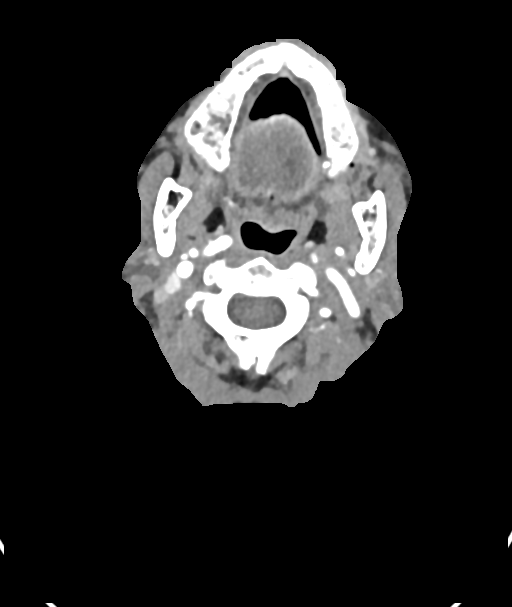
[im 218/327  bone]
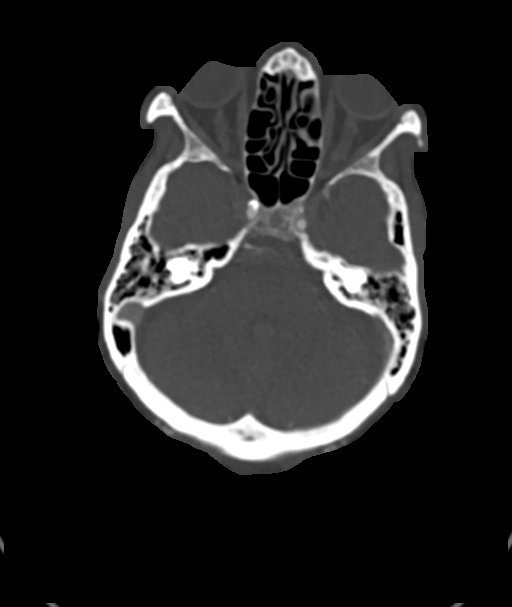
[im 272/327  soft-tissue]
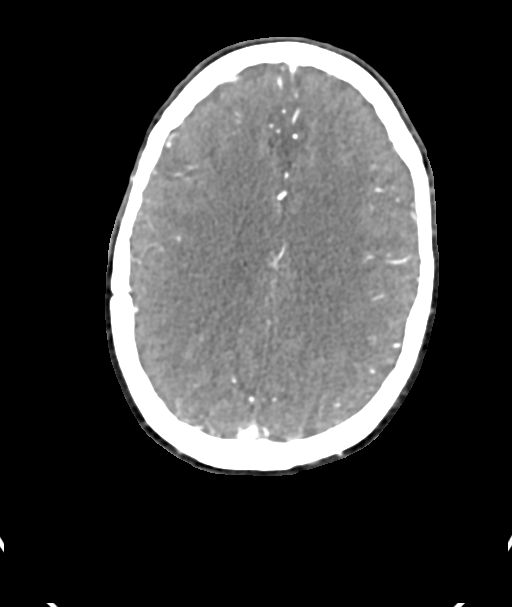

[5 of 33 positions shown; findings below may reference images not displayed]

FINDINGS: CT HEAD FINDINGS

Brain: No evidence of acute infarction, hemorrhage, hydrocephalus,
extra-axial collection or mass lesion/mass effect.

Generalized atrophy. Very mild for age chronic microvascular
ischemic change

Vascular: Atherosclerotic calcification.  No hyperdense vessel.

Skull: Previous right-sided craniotomy. Based on report from 5772
head CTs this was for subdural hematoma evacuation. No changes of
ECA to MCA bypass.

Sinuses: Negative

Orbits: Negative

Review of the MIP images confirms the above findings

CTA NECK FINDINGS

Aortic arch: Diffuse atherosclerotic plaque. No acute finding. Two
vessel branching pattern. Borderline aneurysmal ascending aorta
measuring up to 39 mm diameter.

Right carotid system: Mild atherosclerotic plaque at the carotid
bifurcation, calcified. No stenosis, dissection, or beading.

Left carotid system: Mild atherosclerotic plaque, calcified, at
common carotid bifurcation. No stenosis, dissection, or ulceration.

Vertebral arteries: No proximal subclavian flow limiting stenosis.
The vertebral arteries are smooth and widely patent.

Skeleton: Cervical degenerative change. There is a large periapical
erosion around the left lower canine which has contiguous soft
tissue thickening that is presumably granulation tissue.

Other neck: No incidental mass or adenopathy in the neck.

Upper chest: Airway thickening with possible mild emphysema.

Review of the MIP images confirms the above findings

CTA HEAD FINDINGS

Anterior circulation: Atherosclerotic plaque on the carotid siphons
without stenosis. The right M1 segment is occluded at its origin
with a small anterior temporal branch that is stenotic proximally.
There is asymmetric underfilling of right MCA vessels. Large right
posterior communicating artery with fetal PCA. An anterior
communicating artery is not visualized. Moderate atheromatous
stenosis at the left MCA bifurcation.

Posterior circulation: Mild left vertebral artery dominance.
Moderate proximal basilar stenosis, atheromatous appearing. Moderate
atherosclerotic irregularity the bilateral proximal PCA. No major
branch occlusion. Negative for aneurysm.

Venous sinuses: Patent as permitted by timing.

Anatomic variants: None other than described above

Delayed phase: No parenchymal enhancement or mass.

Review of the MIP images confirms the above findings
IMPRESSION: 1. Chronic proximal right M1 occlusion.
2. Moderate atheromatous narrowing of the proximal basilar,
bilateral proximal PCA, and left MCA bifurcation.
3. No arterial stenosis in the neck.
4. Remote right craniotomy. From 5772 head CT reports this was for
subdural hematoma evacuation.
5. Left lower canine has a large periapical erosion with thickened
and enhancing overlying soft tissues that is presumably granulation
tissue. Cannot exclude a skin fistula or tumor. Correlate with exam.

## 2018-03-22 DIAGNOSIS — Z13820 Encounter for screening for osteoporosis: Secondary | ICD-10-CM | POA: Diagnosis not present

## 2018-03-22 DIAGNOSIS — E2839 Other primary ovarian failure: Secondary | ICD-10-CM | POA: Diagnosis not present

## 2018-07-13 DIAGNOSIS — R69 Illness, unspecified: Secondary | ICD-10-CM | POA: Diagnosis not present

## 2018-07-13 DIAGNOSIS — R109 Unspecified abdominal pain: Secondary | ICD-10-CM | POA: Diagnosis not present

## 2018-07-13 DIAGNOSIS — N3000 Acute cystitis without hematuria: Secondary | ICD-10-CM | POA: Diagnosis not present

## 2018-07-13 DIAGNOSIS — M810 Age-related osteoporosis without current pathological fracture: Secondary | ICD-10-CM | POA: Diagnosis not present

## 2018-07-13 DIAGNOSIS — R197 Diarrhea, unspecified: Secondary | ICD-10-CM | POA: Diagnosis not present

## 2018-07-13 DIAGNOSIS — Z8673 Personal history of transient ischemic attack (TIA), and cerebral infarction without residual deficits: Secondary | ICD-10-CM | POA: Diagnosis not present

## 2018-07-26 DIAGNOSIS — Z7902 Long term (current) use of antithrombotics/antiplatelets: Secondary | ICD-10-CM | POA: Diagnosis not present

## 2018-07-26 DIAGNOSIS — Z9181 History of falling: Secondary | ICD-10-CM | POA: Diagnosis not present

## 2018-07-26 DIAGNOSIS — R69 Illness, unspecified: Secondary | ICD-10-CM | POA: Diagnosis not present

## 2018-07-26 DIAGNOSIS — Z8673 Personal history of transient ischemic attack (TIA), and cerebral infarction without residual deficits: Secondary | ICD-10-CM | POA: Diagnosis not present

## 2018-07-26 DIAGNOSIS — M810 Age-related osteoporosis without current pathological fracture: Secondary | ICD-10-CM | POA: Diagnosis not present

## 2018-07-26 DIAGNOSIS — Z87440 Personal history of urinary (tract) infections: Secondary | ICD-10-CM | POA: Diagnosis not present

## 2018-07-26 DIAGNOSIS — I2510 Atherosclerotic heart disease of native coronary artery without angina pectoris: Secondary | ICD-10-CM | POA: Diagnosis not present

## 2018-07-27 DIAGNOSIS — M810 Age-related osteoporosis without current pathological fracture: Secondary | ICD-10-CM | POA: Diagnosis not present

## 2018-07-31 DIAGNOSIS — Z9181 History of falling: Secondary | ICD-10-CM | POA: Diagnosis not present

## 2018-07-31 DIAGNOSIS — M810 Age-related osteoporosis without current pathological fracture: Secondary | ICD-10-CM | POA: Diagnosis not present

## 2018-07-31 DIAGNOSIS — I2510 Atherosclerotic heart disease of native coronary artery without angina pectoris: Secondary | ICD-10-CM | POA: Diagnosis not present

## 2018-07-31 DIAGNOSIS — R69 Illness, unspecified: Secondary | ICD-10-CM | POA: Diagnosis not present

## 2018-07-31 DIAGNOSIS — Z7902 Long term (current) use of antithrombotics/antiplatelets: Secondary | ICD-10-CM | POA: Diagnosis not present

## 2018-07-31 DIAGNOSIS — Z8673 Personal history of transient ischemic attack (TIA), and cerebral infarction without residual deficits: Secondary | ICD-10-CM | POA: Diagnosis not present

## 2018-07-31 DIAGNOSIS — Z87440 Personal history of urinary (tract) infections: Secondary | ICD-10-CM | POA: Diagnosis not present

## 2018-08-02 DIAGNOSIS — Z7902 Long term (current) use of antithrombotics/antiplatelets: Secondary | ICD-10-CM | POA: Diagnosis not present

## 2018-08-02 DIAGNOSIS — Z9181 History of falling: Secondary | ICD-10-CM | POA: Diagnosis not present

## 2018-08-02 DIAGNOSIS — Z8673 Personal history of transient ischemic attack (TIA), and cerebral infarction without residual deficits: Secondary | ICD-10-CM | POA: Diagnosis not present

## 2018-08-02 DIAGNOSIS — M810 Age-related osteoporosis without current pathological fracture: Secondary | ICD-10-CM | POA: Diagnosis not present

## 2018-08-02 DIAGNOSIS — I2510 Atherosclerotic heart disease of native coronary artery without angina pectoris: Secondary | ICD-10-CM | POA: Diagnosis not present

## 2018-08-02 DIAGNOSIS — R69 Illness, unspecified: Secondary | ICD-10-CM | POA: Diagnosis not present

## 2018-08-02 DIAGNOSIS — Z87440 Personal history of urinary (tract) infections: Secondary | ICD-10-CM | POA: Diagnosis not present

## 2018-08-06 DIAGNOSIS — Z7902 Long term (current) use of antithrombotics/antiplatelets: Secondary | ICD-10-CM | POA: Diagnosis not present

## 2018-08-06 DIAGNOSIS — Z87440 Personal history of urinary (tract) infections: Secondary | ICD-10-CM | POA: Diagnosis not present

## 2018-08-06 DIAGNOSIS — I2510 Atherosclerotic heart disease of native coronary artery without angina pectoris: Secondary | ICD-10-CM | POA: Diagnosis not present

## 2018-08-06 DIAGNOSIS — R69 Illness, unspecified: Secondary | ICD-10-CM | POA: Diagnosis not present

## 2018-08-06 DIAGNOSIS — Z9181 History of falling: Secondary | ICD-10-CM | POA: Diagnosis not present

## 2018-08-06 DIAGNOSIS — Z8673 Personal history of transient ischemic attack (TIA), and cerebral infarction without residual deficits: Secondary | ICD-10-CM | POA: Diagnosis not present

## 2018-08-06 DIAGNOSIS — M810 Age-related osteoporosis without current pathological fracture: Secondary | ICD-10-CM | POA: Diagnosis not present

## 2018-08-07 DIAGNOSIS — Z9181 History of falling: Secondary | ICD-10-CM | POA: Diagnosis not present

## 2018-08-07 DIAGNOSIS — Z7902 Long term (current) use of antithrombotics/antiplatelets: Secondary | ICD-10-CM | POA: Diagnosis not present

## 2018-08-07 DIAGNOSIS — R69 Illness, unspecified: Secondary | ICD-10-CM | POA: Diagnosis not present

## 2018-08-07 DIAGNOSIS — Z87440 Personal history of urinary (tract) infections: Secondary | ICD-10-CM | POA: Diagnosis not present

## 2018-08-07 DIAGNOSIS — I2510 Atherosclerotic heart disease of native coronary artery without angina pectoris: Secondary | ICD-10-CM | POA: Diagnosis not present

## 2018-08-07 DIAGNOSIS — M810 Age-related osteoporosis without current pathological fracture: Secondary | ICD-10-CM | POA: Diagnosis not present

## 2018-08-07 DIAGNOSIS — Z8673 Personal history of transient ischemic attack (TIA), and cerebral infarction without residual deficits: Secondary | ICD-10-CM | POA: Diagnosis not present

## 2018-08-10 DIAGNOSIS — R69 Illness, unspecified: Secondary | ICD-10-CM | POA: Diagnosis not present

## 2018-08-10 DIAGNOSIS — Z87440 Personal history of urinary (tract) infections: Secondary | ICD-10-CM | POA: Diagnosis not present

## 2018-08-10 DIAGNOSIS — M810 Age-related osteoporosis without current pathological fracture: Secondary | ICD-10-CM | POA: Diagnosis not present

## 2018-08-10 DIAGNOSIS — I2510 Atherosclerotic heart disease of native coronary artery without angina pectoris: Secondary | ICD-10-CM | POA: Diagnosis not present

## 2018-08-10 DIAGNOSIS — Z9181 History of falling: Secondary | ICD-10-CM | POA: Diagnosis not present

## 2018-08-10 DIAGNOSIS — Z7902 Long term (current) use of antithrombotics/antiplatelets: Secondary | ICD-10-CM | POA: Diagnosis not present

## 2018-08-10 DIAGNOSIS — Z8673 Personal history of transient ischemic attack (TIA), and cerebral infarction without residual deficits: Secondary | ICD-10-CM | POA: Diagnosis not present

## 2018-08-17 DIAGNOSIS — Z87440 Personal history of urinary (tract) infections: Secondary | ICD-10-CM | POA: Diagnosis not present

## 2018-08-17 DIAGNOSIS — Z8673 Personal history of transient ischemic attack (TIA), and cerebral infarction without residual deficits: Secondary | ICD-10-CM | POA: Diagnosis not present

## 2018-08-17 DIAGNOSIS — Z9181 History of falling: Secondary | ICD-10-CM | POA: Diagnosis not present

## 2018-08-17 DIAGNOSIS — R69 Illness, unspecified: Secondary | ICD-10-CM | POA: Diagnosis not present

## 2018-08-17 DIAGNOSIS — M810 Age-related osteoporosis without current pathological fracture: Secondary | ICD-10-CM | POA: Diagnosis not present

## 2018-08-17 DIAGNOSIS — Z7902 Long term (current) use of antithrombotics/antiplatelets: Secondary | ICD-10-CM | POA: Diagnosis not present

## 2018-08-17 DIAGNOSIS — I2510 Atherosclerotic heart disease of native coronary artery without angina pectoris: Secondary | ICD-10-CM | POA: Diagnosis not present

## 2018-08-20 DIAGNOSIS — N390 Urinary tract infection, site not specified: Secondary | ICD-10-CM | POA: Diagnosis not present

## 2018-08-20 DIAGNOSIS — I509 Heart failure, unspecified: Secondary | ICD-10-CM | POA: Diagnosis not present

## 2018-08-20 DIAGNOSIS — I6930 Unspecified sequelae of cerebral infarction: Secondary | ICD-10-CM | POA: Diagnosis not present

## 2018-08-20 DIAGNOSIS — R69 Illness, unspecified: Secondary | ICD-10-CM | POA: Diagnosis not present

## 2018-08-21 DIAGNOSIS — Z8673 Personal history of transient ischemic attack (TIA), and cerebral infarction without residual deficits: Secondary | ICD-10-CM | POA: Diagnosis not present

## 2018-08-21 DIAGNOSIS — Z9181 History of falling: Secondary | ICD-10-CM | POA: Diagnosis not present

## 2018-08-21 DIAGNOSIS — M810 Age-related osteoporosis without current pathological fracture: Secondary | ICD-10-CM | POA: Diagnosis not present

## 2018-08-21 DIAGNOSIS — Z87440 Personal history of urinary (tract) infections: Secondary | ICD-10-CM | POA: Diagnosis not present

## 2018-08-21 DIAGNOSIS — R69 Illness, unspecified: Secondary | ICD-10-CM | POA: Diagnosis not present

## 2018-08-21 DIAGNOSIS — Z7902 Long term (current) use of antithrombotics/antiplatelets: Secondary | ICD-10-CM | POA: Diagnosis not present

## 2018-08-21 DIAGNOSIS — I2510 Atherosclerotic heart disease of native coronary artery without angina pectoris: Secondary | ICD-10-CM | POA: Diagnosis not present

## 2018-09-21 DIAGNOSIS — I509 Heart failure, unspecified: Secondary | ICD-10-CM | POA: Diagnosis not present

## 2018-09-21 DIAGNOSIS — K439 Ventral hernia without obstruction or gangrene: Secondary | ICD-10-CM | POA: Diagnosis not present

## 2018-09-21 DIAGNOSIS — I10 Essential (primary) hypertension: Secondary | ICD-10-CM | POA: Diagnosis not present

## 2018-09-21 DIAGNOSIS — R634 Abnormal weight loss: Secondary | ICD-10-CM | POA: Diagnosis not present

## 2018-09-25 DIAGNOSIS — I509 Heart failure, unspecified: Secondary | ICD-10-CM | POA: Diagnosis not present

## 2018-09-25 DIAGNOSIS — R69 Illness, unspecified: Secondary | ICD-10-CM | POA: Diagnosis not present

## 2018-09-25 DIAGNOSIS — Z515 Encounter for palliative care: Secondary | ICD-10-CM | POA: Diagnosis not present

## 2018-09-25 DIAGNOSIS — I6930 Unspecified sequelae of cerebral infarction: Secondary | ICD-10-CM | POA: Diagnosis not present

## 2018-10-10 DIAGNOSIS — I639 Cerebral infarction, unspecified: Secondary | ICD-10-CM | POA: Diagnosis not present

## 2018-10-10 DIAGNOSIS — I5030 Unspecified diastolic (congestive) heart failure: Secondary | ICD-10-CM | POA: Diagnosis not present

## 2018-10-10 DIAGNOSIS — I2510 Atherosclerotic heart disease of native coronary artery without angina pectoris: Secondary | ICD-10-CM | POA: Diagnosis not present

## 2018-10-10 DIAGNOSIS — I10 Essential (primary) hypertension: Secondary | ICD-10-CM | POA: Diagnosis not present

## 2018-10-10 DIAGNOSIS — E785 Hyperlipidemia, unspecified: Secondary | ICD-10-CM | POA: Diagnosis not present

## 2018-10-30 DIAGNOSIS — R8290 Unspecified abnormal findings in urine: Secondary | ICD-10-CM | POA: Diagnosis not present

## 2018-11-05 DIAGNOSIS — R69 Illness, unspecified: Secondary | ICD-10-CM | POA: Diagnosis not present

## 2018-11-05 DIAGNOSIS — I69398 Other sequelae of cerebral infarction: Secondary | ICD-10-CM | POA: Diagnosis not present

## 2018-11-05 DIAGNOSIS — Z87440 Personal history of urinary (tract) infections: Secondary | ICD-10-CM | POA: Diagnosis not present

## 2018-11-05 DIAGNOSIS — Z515 Encounter for palliative care: Secondary | ICD-10-CM | POA: Diagnosis not present

## 2018-11-24 DIAGNOSIS — Z1159 Encounter for screening for other viral diseases: Secondary | ICD-10-CM | POA: Diagnosis not present

## 2018-11-24 DIAGNOSIS — D72829 Elevated white blood cell count, unspecified: Secondary | ICD-10-CM | POA: Diagnosis not present

## 2018-11-24 DIAGNOSIS — Z0189 Encounter for other specified special examinations: Secondary | ICD-10-CM | POA: Diagnosis not present

## 2018-11-24 DIAGNOSIS — R509 Fever, unspecified: Secondary | ICD-10-CM | POA: Diagnosis not present

## 2018-11-24 DIAGNOSIS — A419 Sepsis, unspecified organism: Secondary | ICD-10-CM | POA: Diagnosis not present

## 2018-11-24 DIAGNOSIS — J810 Acute pulmonary edema: Secondary | ICD-10-CM | POA: Diagnosis not present

## 2018-11-24 DIAGNOSIS — N3000 Acute cystitis without hematuria: Secondary | ICD-10-CM | POA: Diagnosis not present

## 2018-11-24 DIAGNOSIS — K5730 Diverticulosis of large intestine without perforation or abscess without bleeding: Secondary | ICD-10-CM | POA: Diagnosis not present

## 2018-11-25 DIAGNOSIS — N3000 Acute cystitis without hematuria: Secondary | ICD-10-CM | POA: Diagnosis not present

## 2018-11-25 DIAGNOSIS — I2510 Atherosclerotic heart disease of native coronary artery without angina pectoris: Secondary | ICD-10-CM | POA: Diagnosis not present

## 2018-11-25 DIAGNOSIS — E8809 Other disorders of plasma-protein metabolism, not elsewhere classified: Secondary | ICD-10-CM | POA: Diagnosis not present

## 2018-11-25 DIAGNOSIS — K5730 Diverticulosis of large intestine without perforation or abscess without bleeding: Secondary | ICD-10-CM | POA: Diagnosis not present

## 2018-11-25 DIAGNOSIS — A419 Sepsis, unspecified organism: Secondary | ICD-10-CM | POA: Diagnosis not present

## 2018-11-25 DIAGNOSIS — E785 Hyperlipidemia, unspecified: Secondary | ICD-10-CM | POA: Diagnosis not present

## 2018-11-25 DIAGNOSIS — Z8673 Personal history of transient ischemic attack (TIA), and cerebral infarction without residual deficits: Secondary | ICD-10-CM | POA: Diagnosis not present

## 2018-11-25 DIAGNOSIS — R739 Hyperglycemia, unspecified: Secondary | ICD-10-CM | POA: Diagnosis not present

## 2018-11-25 DIAGNOSIS — K5720 Diverticulitis of large intestine with perforation and abscess without bleeding: Secondary | ICD-10-CM | POA: Diagnosis not present

## 2018-11-25 DIAGNOSIS — D72829 Elevated white blood cell count, unspecified: Secondary | ICD-10-CM | POA: Diagnosis not present

## 2018-11-25 DIAGNOSIS — I10 Essential (primary) hypertension: Secondary | ICD-10-CM | POA: Diagnosis not present

## 2018-11-25 DIAGNOSIS — E872 Acidosis: Secondary | ICD-10-CM | POA: Diagnosis not present

## 2018-11-25 DIAGNOSIS — R509 Fever, unspecified: Secondary | ICD-10-CM | POA: Diagnosis not present

## 2018-11-25 DIAGNOSIS — Z0189 Encounter for other specified special examinations: Secondary | ICD-10-CM | POA: Diagnosis not present

## 2018-11-25 DIAGNOSIS — I110 Hypertensive heart disease with heart failure: Secondary | ICD-10-CM | POA: Diagnosis not present

## 2018-11-25 DIAGNOSIS — I509 Heart failure, unspecified: Secondary | ICD-10-CM | POA: Diagnosis not present

## 2018-11-25 DIAGNOSIS — R69 Illness, unspecified: Secondary | ICD-10-CM | POA: Diagnosis not present

## 2018-11-25 DIAGNOSIS — E870 Hyperosmolality and hypernatremia: Secondary | ICD-10-CM | POA: Diagnosis not present

## 2018-11-26 DIAGNOSIS — K5720 Diverticulitis of large intestine with perforation and abscess without bleeding: Secondary | ICD-10-CM | POA: Diagnosis not present

## 2018-11-27 DIAGNOSIS — R69 Illness, unspecified: Secondary | ICD-10-CM | POA: Diagnosis not present

## 2018-11-27 DIAGNOSIS — I10 Essential (primary) hypertension: Secondary | ICD-10-CM | POA: Diagnosis not present

## 2018-11-27 DIAGNOSIS — Z8673 Personal history of transient ischemic attack (TIA), and cerebral infarction without residual deficits: Secondary | ICD-10-CM | POA: Diagnosis not present

## 2018-11-27 DIAGNOSIS — K5720 Diverticulitis of large intestine with perforation and abscess without bleeding: Secondary | ICD-10-CM | POA: Diagnosis not present

## 2018-11-27 DIAGNOSIS — N3000 Acute cystitis without hematuria: Secondary | ICD-10-CM | POA: Diagnosis not present

## 2018-11-27 DIAGNOSIS — E870 Hyperosmolality and hypernatremia: Secondary | ICD-10-CM | POA: Diagnosis not present

## 2018-11-27 DIAGNOSIS — E8809 Other disorders of plasma-protein metabolism, not elsewhere classified: Secondary | ICD-10-CM | POA: Diagnosis not present

## 2018-11-28 DIAGNOSIS — R69 Illness, unspecified: Secondary | ICD-10-CM | POA: Diagnosis not present

## 2018-11-28 DIAGNOSIS — A419 Sepsis, unspecified organism: Secondary | ICD-10-CM | POA: Diagnosis not present

## 2018-11-28 DIAGNOSIS — D72829 Elevated white blood cell count, unspecified: Secondary | ICD-10-CM | POA: Diagnosis not present

## 2018-11-28 DIAGNOSIS — E870 Hyperosmolality and hypernatremia: Secondary | ICD-10-CM | POA: Diagnosis not present

## 2018-11-28 DIAGNOSIS — E872 Acidosis: Secondary | ICD-10-CM | POA: Diagnosis not present

## 2018-11-28 DIAGNOSIS — Z8673 Personal history of transient ischemic attack (TIA), and cerebral infarction without residual deficits: Secondary | ICD-10-CM | POA: Diagnosis not present

## 2018-11-28 DIAGNOSIS — N3000 Acute cystitis without hematuria: Secondary | ICD-10-CM | POA: Diagnosis not present

## 2018-11-28 DIAGNOSIS — E8809 Other disorders of plasma-protein metabolism, not elsewhere classified: Secondary | ICD-10-CM | POA: Diagnosis not present

## 2018-11-28 DIAGNOSIS — K5720 Diverticulitis of large intestine with perforation and abscess without bleeding: Secondary | ICD-10-CM | POA: Diagnosis not present

## 2018-11-28 DIAGNOSIS — I10 Essential (primary) hypertension: Secondary | ICD-10-CM | POA: Diagnosis not present

## 2018-11-30 DIAGNOSIS — N390 Urinary tract infection, site not specified: Secondary | ICD-10-CM | POA: Diagnosis not present

## 2018-11-30 DIAGNOSIS — R269 Unspecified abnormalities of gait and mobility: Secondary | ICD-10-CM | POA: Diagnosis not present

## 2018-11-30 DIAGNOSIS — I10 Essential (primary) hypertension: Secondary | ICD-10-CM | POA: Diagnosis not present

## 2018-11-30 DIAGNOSIS — R69 Illness, unspecified: Secondary | ICD-10-CM | POA: Diagnosis not present

## 2018-12-05 DIAGNOSIS — I10 Essential (primary) hypertension: Secondary | ICD-10-CM | POA: Diagnosis not present

## 2018-12-05 DIAGNOSIS — R69 Illness, unspecified: Secondary | ICD-10-CM | POA: Diagnosis not present

## 2018-12-05 DIAGNOSIS — N390 Urinary tract infection, site not specified: Secondary | ICD-10-CM | POA: Diagnosis not present

## 2018-12-05 DIAGNOSIS — R269 Unspecified abnormalities of gait and mobility: Secondary | ICD-10-CM | POA: Diagnosis not present

## 2018-12-06 DIAGNOSIS — R269 Unspecified abnormalities of gait and mobility: Secondary | ICD-10-CM | POA: Diagnosis not present

## 2018-12-06 DIAGNOSIS — N390 Urinary tract infection, site not specified: Secondary | ICD-10-CM | POA: Diagnosis not present

## 2018-12-06 DIAGNOSIS — I10 Essential (primary) hypertension: Secondary | ICD-10-CM | POA: Diagnosis not present

## 2018-12-06 DIAGNOSIS — R69 Illness, unspecified: Secondary | ICD-10-CM | POA: Diagnosis not present

## 2018-12-07 DIAGNOSIS — R5381 Other malaise: Secondary | ICD-10-CM | POA: Diagnosis not present

## 2018-12-07 DIAGNOSIS — I10 Essential (primary) hypertension: Secondary | ICD-10-CM | POA: Diagnosis not present

## 2018-12-07 DIAGNOSIS — K5720 Diverticulitis of large intestine with perforation and abscess without bleeding: Secondary | ICD-10-CM | POA: Diagnosis not present

## 2018-12-07 DIAGNOSIS — R69 Illness, unspecified: Secondary | ICD-10-CM | POA: Diagnosis not present

## 2018-12-10 DIAGNOSIS — I10 Essential (primary) hypertension: Secondary | ICD-10-CM | POA: Diagnosis not present

## 2018-12-10 DIAGNOSIS — R69 Illness, unspecified: Secondary | ICD-10-CM | POA: Diagnosis not present

## 2018-12-10 DIAGNOSIS — N390 Urinary tract infection, site not specified: Secondary | ICD-10-CM | POA: Diagnosis not present

## 2018-12-10 DIAGNOSIS — R269 Unspecified abnormalities of gait and mobility: Secondary | ICD-10-CM | POA: Diagnosis not present

## 2018-12-11 DIAGNOSIS — I10 Essential (primary) hypertension: Secondary | ICD-10-CM | POA: Diagnosis not present

## 2018-12-11 DIAGNOSIS — R269 Unspecified abnormalities of gait and mobility: Secondary | ICD-10-CM | POA: Diagnosis not present

## 2018-12-11 DIAGNOSIS — N390 Urinary tract infection, site not specified: Secondary | ICD-10-CM | POA: Diagnosis not present

## 2018-12-11 DIAGNOSIS — R69 Illness, unspecified: Secondary | ICD-10-CM | POA: Diagnosis not present

## 2018-12-12 DIAGNOSIS — R269 Unspecified abnormalities of gait and mobility: Secondary | ICD-10-CM | POA: Diagnosis not present

## 2018-12-12 DIAGNOSIS — R69 Illness, unspecified: Secondary | ICD-10-CM | POA: Diagnosis not present

## 2018-12-12 DIAGNOSIS — N390 Urinary tract infection, site not specified: Secondary | ICD-10-CM | POA: Diagnosis not present

## 2018-12-12 DIAGNOSIS — I10 Essential (primary) hypertension: Secondary | ICD-10-CM | POA: Diagnosis not present

## 2018-12-12 DIAGNOSIS — I25111 Atherosclerotic heart disease of native coronary artery with angina pectoris with documented spasm: Secondary | ICD-10-CM | POA: Diagnosis not present

## 2018-12-13 DIAGNOSIS — R69 Illness, unspecified: Secondary | ICD-10-CM | POA: Diagnosis not present

## 2018-12-13 DIAGNOSIS — I10 Essential (primary) hypertension: Secondary | ICD-10-CM | POA: Diagnosis not present

## 2018-12-13 DIAGNOSIS — N390 Urinary tract infection, site not specified: Secondary | ICD-10-CM | POA: Diagnosis not present

## 2018-12-13 DIAGNOSIS — R269 Unspecified abnormalities of gait and mobility: Secondary | ICD-10-CM | POA: Diagnosis not present

## 2018-12-14 DIAGNOSIS — I509 Heart failure, unspecified: Secondary | ICD-10-CM | POA: Diagnosis not present

## 2018-12-14 DIAGNOSIS — R69 Illness, unspecified: Secondary | ICD-10-CM | POA: Diagnosis not present

## 2018-12-14 DIAGNOSIS — N390 Urinary tract infection, site not specified: Secondary | ICD-10-CM | POA: Diagnosis not present

## 2018-12-14 DIAGNOSIS — Z8673 Personal history of transient ischemic attack (TIA), and cerebral infarction without residual deficits: Secondary | ICD-10-CM | POA: Diagnosis not present

## 2018-12-18 DIAGNOSIS — I10 Essential (primary) hypertension: Secondary | ICD-10-CM | POA: Diagnosis not present

## 2018-12-18 DIAGNOSIS — R269 Unspecified abnormalities of gait and mobility: Secondary | ICD-10-CM | POA: Diagnosis not present

## 2018-12-18 DIAGNOSIS — N390 Urinary tract infection, site not specified: Secondary | ICD-10-CM | POA: Diagnosis not present

## 2018-12-18 DIAGNOSIS — R69 Illness, unspecified: Secondary | ICD-10-CM | POA: Diagnosis not present

## 2018-12-19 DIAGNOSIS — N390 Urinary tract infection, site not specified: Secondary | ICD-10-CM | POA: Diagnosis not present

## 2018-12-19 DIAGNOSIS — R69 Illness, unspecified: Secondary | ICD-10-CM | POA: Diagnosis not present

## 2018-12-19 DIAGNOSIS — I10 Essential (primary) hypertension: Secondary | ICD-10-CM | POA: Diagnosis not present

## 2018-12-19 DIAGNOSIS — R269 Unspecified abnormalities of gait and mobility: Secondary | ICD-10-CM | POA: Diagnosis not present

## 2018-12-20 DIAGNOSIS — R69 Illness, unspecified: Secondary | ICD-10-CM | POA: Diagnosis not present

## 2018-12-20 DIAGNOSIS — N390 Urinary tract infection, site not specified: Secondary | ICD-10-CM | POA: Diagnosis not present

## 2018-12-20 DIAGNOSIS — I10 Essential (primary) hypertension: Secondary | ICD-10-CM | POA: Diagnosis not present

## 2018-12-20 DIAGNOSIS — R269 Unspecified abnormalities of gait and mobility: Secondary | ICD-10-CM | POA: Diagnosis not present

## 2018-12-26 DIAGNOSIS — N390 Urinary tract infection, site not specified: Secondary | ICD-10-CM | POA: Diagnosis not present

## 2018-12-26 DIAGNOSIS — I10 Essential (primary) hypertension: Secondary | ICD-10-CM | POA: Diagnosis not present

## 2018-12-26 DIAGNOSIS — R69 Illness, unspecified: Secondary | ICD-10-CM | POA: Diagnosis not present

## 2018-12-26 DIAGNOSIS — R269 Unspecified abnormalities of gait and mobility: Secondary | ICD-10-CM | POA: Diagnosis not present

## 2018-12-28 DIAGNOSIS — I10 Essential (primary) hypertension: Secondary | ICD-10-CM | POA: Diagnosis not present

## 2018-12-28 DIAGNOSIS — N390 Urinary tract infection, site not specified: Secondary | ICD-10-CM | POA: Diagnosis not present

## 2018-12-28 DIAGNOSIS — R69 Illness, unspecified: Secondary | ICD-10-CM | POA: Diagnosis not present

## 2018-12-28 DIAGNOSIS — R269 Unspecified abnormalities of gait and mobility: Secondary | ICD-10-CM | POA: Diagnosis not present

## 2019-01-01 DIAGNOSIS — R69 Illness, unspecified: Secondary | ICD-10-CM | POA: Diagnosis not present

## 2019-01-01 DIAGNOSIS — R269 Unspecified abnormalities of gait and mobility: Secondary | ICD-10-CM | POA: Diagnosis not present

## 2019-01-01 DIAGNOSIS — I10 Essential (primary) hypertension: Secondary | ICD-10-CM | POA: Diagnosis not present

## 2019-01-01 DIAGNOSIS — N390 Urinary tract infection, site not specified: Secondary | ICD-10-CM | POA: Diagnosis not present

## 2019-01-03 DIAGNOSIS — N390 Urinary tract infection, site not specified: Secondary | ICD-10-CM | POA: Diagnosis not present

## 2019-01-03 DIAGNOSIS — R269 Unspecified abnormalities of gait and mobility: Secondary | ICD-10-CM | POA: Diagnosis not present

## 2019-01-03 DIAGNOSIS — I10 Essential (primary) hypertension: Secondary | ICD-10-CM | POA: Diagnosis not present

## 2019-01-03 DIAGNOSIS — R69 Illness, unspecified: Secondary | ICD-10-CM | POA: Diagnosis not present

## 2019-01-14 DIAGNOSIS — N3000 Acute cystitis without hematuria: Secondary | ICD-10-CM | POA: Diagnosis not present

## 2019-01-14 DIAGNOSIS — R5381 Other malaise: Secondary | ICD-10-CM | POA: Diagnosis not present

## 2019-01-14 DIAGNOSIS — R69 Illness, unspecified: Secondary | ICD-10-CM | POA: Diagnosis not present

## 2019-01-14 DIAGNOSIS — I10 Essential (primary) hypertension: Secondary | ICD-10-CM | POA: Diagnosis not present

## 2019-02-13 DIAGNOSIS — I509 Heart failure, unspecified: Secondary | ICD-10-CM | POA: Diagnosis not present

## 2019-02-13 DIAGNOSIS — I6930 Unspecified sequelae of cerebral infarction: Secondary | ICD-10-CM | POA: Diagnosis not present

## 2019-02-13 DIAGNOSIS — R69 Illness, unspecified: Secondary | ICD-10-CM | POA: Diagnosis not present

## 2019-02-13 DIAGNOSIS — N390 Urinary tract infection, site not specified: Secondary | ICD-10-CM | POA: Diagnosis not present

## 2019-02-28 DIAGNOSIS — I10 Essential (primary) hypertension: Secondary | ICD-10-CM | POA: Diagnosis not present

## 2019-02-28 DIAGNOSIS — E785 Hyperlipidemia, unspecified: Secondary | ICD-10-CM | POA: Diagnosis not present

## 2019-02-28 DIAGNOSIS — I5030 Unspecified diastolic (congestive) heart failure: Secondary | ICD-10-CM | POA: Diagnosis not present

## 2019-02-28 DIAGNOSIS — R69 Illness, unspecified: Secondary | ICD-10-CM | POA: Diagnosis not present

## 2019-02-28 DIAGNOSIS — I25118 Atherosclerotic heart disease of native coronary artery with other forms of angina pectoris: Secondary | ICD-10-CM | POA: Diagnosis not present

## 2019-04-10 DIAGNOSIS — N3000 Acute cystitis without hematuria: Secondary | ICD-10-CM | POA: Diagnosis not present

## 2019-04-10 DIAGNOSIS — I10 Essential (primary) hypertension: Secondary | ICD-10-CM | POA: Diagnosis not present

## 2019-04-17 DIAGNOSIS — N390 Urinary tract infection, site not specified: Secondary | ICD-10-CM | POA: Diagnosis not present

## 2019-04-17 DIAGNOSIS — R69 Illness, unspecified: Secondary | ICD-10-CM | POA: Diagnosis not present

## 2019-04-17 DIAGNOSIS — I6930 Unspecified sequelae of cerebral infarction: Secondary | ICD-10-CM | POA: Diagnosis not present

## 2019-04-17 DIAGNOSIS — Z7189 Other specified counseling: Secondary | ICD-10-CM | POA: Diagnosis not present

## 2019-05-10 DIAGNOSIS — R109 Unspecified abdominal pain: Secondary | ICD-10-CM | POA: Diagnosis not present

## 2019-05-10 DIAGNOSIS — I10 Essential (primary) hypertension: Secondary | ICD-10-CM | POA: Diagnosis not present

## 2019-05-10 DIAGNOSIS — K6389 Other specified diseases of intestine: Secondary | ICD-10-CM | POA: Diagnosis not present

## 2019-05-10 DIAGNOSIS — K5720 Diverticulitis of large intestine with perforation and abscess without bleeding: Secondary | ICD-10-CM | POA: Diagnosis not present

## 2019-05-10 DIAGNOSIS — R197 Diarrhea, unspecified: Secondary | ICD-10-CM | POA: Diagnosis not present

## 2019-05-10 DIAGNOSIS — K5790 Diverticulosis of intestine, part unspecified, without perforation or abscess without bleeding: Secondary | ICD-10-CM | POA: Diagnosis not present

## 2019-05-10 DIAGNOSIS — M545 Low back pain: Secondary | ICD-10-CM | POA: Diagnosis not present

## 2019-05-16 DIAGNOSIS — N3020 Other chronic cystitis without hematuria: Secondary | ICD-10-CM | POA: Diagnosis not present

## 2019-05-16 DIAGNOSIS — R109 Unspecified abdominal pain: Secondary | ICD-10-CM | POA: Diagnosis not present

## 2019-05-16 DIAGNOSIS — N3000 Acute cystitis without hematuria: Secondary | ICD-10-CM | POA: Diagnosis not present

## 2019-06-26 DIAGNOSIS — R8271 Bacteriuria: Secondary | ICD-10-CM | POA: Diagnosis not present

## 2019-06-26 DIAGNOSIS — R109 Unspecified abdominal pain: Secondary | ICD-10-CM | POA: Diagnosis not present

## 2019-06-26 DIAGNOSIS — N3020 Other chronic cystitis without hematuria: Secondary | ICD-10-CM | POA: Diagnosis not present

## 2019-06-26 DIAGNOSIS — N3000 Acute cystitis without hematuria: Secondary | ICD-10-CM | POA: Diagnosis not present

## 2019-07-03 DIAGNOSIS — N390 Urinary tract infection, site not specified: Secondary | ICD-10-CM | POA: Diagnosis not present

## 2019-07-03 DIAGNOSIS — I509 Heart failure, unspecified: Secondary | ICD-10-CM | POA: Diagnosis not present

## 2019-07-03 DIAGNOSIS — R69 Illness, unspecified: Secondary | ICD-10-CM | POA: Diagnosis not present

## 2019-07-03 DIAGNOSIS — I6930 Unspecified sequelae of cerebral infarction: Secondary | ICD-10-CM | POA: Diagnosis not present

## 2019-08-01 DIAGNOSIS — I2510 Atherosclerotic heart disease of native coronary artery without angina pectoris: Secondary | ICD-10-CM | POA: Diagnosis not present

## 2019-08-01 DIAGNOSIS — Z823 Family history of stroke: Secondary | ICD-10-CM | POA: Diagnosis not present

## 2019-08-01 DIAGNOSIS — Z7902 Long term (current) use of antithrombotics/antiplatelets: Secondary | ICD-10-CM | POA: Diagnosis not present

## 2019-08-01 DIAGNOSIS — I252 Old myocardial infarction: Secondary | ICD-10-CM | POA: Diagnosis not present

## 2019-08-01 DIAGNOSIS — I110 Hypertensive heart disease with heart failure: Secondary | ICD-10-CM | POA: Diagnosis not present

## 2019-08-01 DIAGNOSIS — I509 Heart failure, unspecified: Secondary | ICD-10-CM | POA: Diagnosis not present

## 2019-08-01 DIAGNOSIS — R32 Unspecified urinary incontinence: Secondary | ICD-10-CM | POA: Diagnosis not present

## 2019-08-01 DIAGNOSIS — E785 Hyperlipidemia, unspecified: Secondary | ICD-10-CM | POA: Diagnosis not present

## 2019-08-01 DIAGNOSIS — Z008 Encounter for other general examination: Secondary | ICD-10-CM | POA: Diagnosis not present

## 2019-08-01 DIAGNOSIS — I739 Peripheral vascular disease, unspecified: Secondary | ICD-10-CM | POA: Diagnosis not present

## 2019-08-01 DIAGNOSIS — R69 Illness, unspecified: Secondary | ICD-10-CM | POA: Diagnosis not present

## 2019-08-14 DIAGNOSIS — N3020 Other chronic cystitis without hematuria: Secondary | ICD-10-CM | POA: Diagnosis not present

## 2019-08-19 DIAGNOSIS — I25118 Atherosclerotic heart disease of native coronary artery with other forms of angina pectoris: Secondary | ICD-10-CM | POA: Diagnosis not present

## 2019-08-19 DIAGNOSIS — I10 Essential (primary) hypertension: Secondary | ICD-10-CM | POA: Diagnosis not present

## 2019-08-19 DIAGNOSIS — E785 Hyperlipidemia, unspecified: Secondary | ICD-10-CM | POA: Diagnosis not present

## 2019-08-19 DIAGNOSIS — R69 Illness, unspecified: Secondary | ICD-10-CM | POA: Diagnosis not present

## 2019-09-19 DIAGNOSIS — Z8673 Personal history of transient ischemic attack (TIA), and cerebral infarction without residual deficits: Secondary | ICD-10-CM | POA: Diagnosis not present

## 2019-09-19 DIAGNOSIS — Z515 Encounter for palliative care: Secondary | ICD-10-CM | POA: Diagnosis not present

## 2019-09-19 DIAGNOSIS — N390 Urinary tract infection, site not specified: Secondary | ICD-10-CM | POA: Diagnosis not present

## 2019-09-19 DIAGNOSIS — R69 Illness, unspecified: Secondary | ICD-10-CM | POA: Diagnosis not present
